# Patient Record
Sex: Female | Born: 1935 | Race: White | Hispanic: No | Smoking: Never smoker
Health system: Southern US, Community
[De-identification: ages and names within clinical notes are randomized; demographics above are authoritative.]

## PROBLEM LIST (undated history)

## (undated) DIAGNOSIS — I10 Essential (primary) hypertension: Secondary | ICD-10-CM

## (undated) DIAGNOSIS — E119 Type 2 diabetes mellitus without complications: Secondary | ICD-10-CM

## (undated) DIAGNOSIS — M1990 Unspecified osteoarthritis, unspecified site: Secondary | ICD-10-CM

## (undated) DIAGNOSIS — K219 Gastro-esophageal reflux disease without esophagitis: Secondary | ICD-10-CM

## (undated) HISTORY — PX: ABDOMINAL HYSTERECTOMY: SHX81

## (undated) HISTORY — DX: Essential (primary) hypertension: I10

## (undated) HISTORY — PX: OTHER SURGICAL HISTORY: SHX169

---

## 2004-01-31 ENCOUNTER — Ambulatory Visit (HOSPITAL_COMMUNITY): Admission: RE | Admit: 2004-01-31 | Discharge: 2004-01-31 | Payer: Self-pay | Admitting: Orthopaedic Surgery

## 2004-09-11 ENCOUNTER — Ambulatory Visit (HOSPITAL_COMMUNITY): Admission: RE | Admit: 2004-09-11 | Discharge: 2004-09-11 | Payer: Self-pay | Admitting: Orthopaedic Surgery

## 2010-10-06 ENCOUNTER — Encounter: Payer: Self-pay | Admitting: Orthopaedic Surgery

## 2012-08-04 DIAGNOSIS — R002 Palpitations: Secondary | ICD-10-CM | POA: Insufficient documentation

## 2012-08-04 DIAGNOSIS — H269 Unspecified cataract: Secondary | ICD-10-CM | POA: Insufficient documentation

## 2012-08-04 DIAGNOSIS — R0600 Dyspnea, unspecified: Secondary | ICD-10-CM | POA: Insufficient documentation

## 2012-08-18 ENCOUNTER — Encounter: Payer: Self-pay | Admitting: Cardiology

## 2012-09-18 ENCOUNTER — Ambulatory Visit: Payer: Self-pay | Admitting: Cardiology

## 2012-11-22 DIAGNOSIS — G4700 Insomnia, unspecified: Secondary | ICD-10-CM | POA: Insufficient documentation

## 2013-04-12 ENCOUNTER — Encounter: Payer: Self-pay | Admitting: Nurse Practitioner

## 2013-04-12 ENCOUNTER — Ambulatory Visit (INDEPENDENT_AMBULATORY_CARE_PROVIDER_SITE_OTHER): Payer: Medicare Other | Admitting: Nurse Practitioner

## 2013-04-12 VITALS — BP 111/75 | HR 62 | Temp 97.1°F | Ht 62.0 in | Wt 156.0 lb

## 2013-04-12 DIAGNOSIS — I10 Essential (primary) hypertension: Secondary | ICD-10-CM

## 2013-04-12 DIAGNOSIS — M170 Bilateral primary osteoarthritis of knee: Secondary | ICD-10-CM

## 2013-04-12 DIAGNOSIS — M1710 Unilateral primary osteoarthritis, unspecified knee: Secondary | ICD-10-CM

## 2013-04-12 MED ORDER — METOPROLOL SUCCINATE ER 25 MG PO TB24: 25.0000 mg | ORAL_TABLET | Freq: Every day | ORAL | Status: DC

## 2013-04-12 MED ORDER — CHLORTHALIDONE 25 MG PO TABS: 25.0000 mg | ORAL_TABLET | Freq: Every day | ORAL | Status: DC

## 2013-04-12 NOTE — Patient Instructions (Addendum)

## 2013-04-12 NOTE — Progress Notes (Signed)
  Subjective:    Patient ID: Norma Barton, female    DOB: Sep 01, 1935, 78 y.o.   MRN: [id number]  Hypertension This is a chronic problem. The current episode started more than 1 year ago. The problem has been resolved since onset. The problem is controlled. Associated symptoms include shortness of breath (DOE). Pertinent negatives include no anxiety, blurred vision, palpitations or peripheral edema. Risk factors for coronary artery disease include post-menopausal state. The current treatment provides significant improvement. There is no history of a thyroid problem. There is no history of sleep apnea.  Osteoarthritis bil knees Sees Dr. Hilda Lias- he rx her norco for pain- is planning to have total knee replacement in the next couple of years.   Review of Systems  Eyes: Negative for blurred vision.  Respiratory: Positive for shortness of breath (DOE).   Cardiovascular: Negative for palpitations.  All other systems reviewed and are negative.       Objective:   Physical Exam  Vitals reviewed. Constitutional: She is oriented to person, place, and time. She appears well-developed and well-nourished.  HENT:  Head: Normocephalic.  Right Ear: External ear normal.  Left Ear: External ear normal.  Mouth/Throat: Oropharynx is clear and moist.  Eyes: Pupils are equal, round, and reactive to light.  Neck: Normal range of motion. Neck supple. No thyromegaly present.  Cardiovascular: Normal rate, regular rhythm, normal heart sounds and intact distal pulses.   Pulmonary/Chest: Effort normal and breath sounds normal.  Abdominal: Soft. Bowel sounds are normal. She exhibits no distension. There is no tenderness.  Musculoskeletal: Normal range of motion. She exhibits no edema and no tenderness.  Neurological: She is alert and oriented to person, place, and time.  Skin: Skin is warm and dry.  Psychiatric: She has a normal mood and affect. Her behavior is normal. Judgment and thought content normal.     BP 111/75  Pulse 62  Temp(Src) 97.1 F (36.2 C) (Oral)  Ht 5\' 2"  (1.575 m)  Wt 156 lb (70.761 kg)  BMI 28.53 kg/m2       Assessment & Plan:

## 2013-04-14 LAB — CMP14+EGFR
ALT: 25 IU/L (ref 0–32)
AST: 26 IU/L (ref 0–40)
Albumin/Globulin Ratio: 1.9 (ref 1.1–2.5)
Calcium: 10.1 mg/dL (ref 8.6–10.2)
Chloride: 98 mmol/L (ref 97–108)
Glucose: 88 mg/dL (ref 65–99)
Potassium: 3.5 mmol/L (ref 3.5–5.2)
Sodium: 140 mmol/L (ref 134–144)
Total Protein: 6.3 g/dL (ref 6.0–8.5)

## 2013-04-14 LAB — NMR, LIPOPROFILE
HDL Cholesterol by NMR: 56 mg/dL (ref >=40)
LDL Particle Number: 931 nmol/L (ref <1000)
LDL Size: 22.1 nm (ref >20.5)
Small LDL Particle Number: 205 nmol/L (ref <=527)
Triglycerides by NMR: 187 mg/dL — ABNORMAL HIGH (ref <150)

## 2013-09-14 ENCOUNTER — Other Ambulatory Visit: Payer: Self-pay | Admitting: Nurse Practitioner

## 2013-09-17 NOTE — Telephone Encounter (Signed)
Last seen 04/12/13  MMM 

## 2013-10-19 ENCOUNTER — Other Ambulatory Visit: Payer: Self-pay | Admitting: Nurse Practitioner

## 2013-10-31 ENCOUNTER — Other Ambulatory Visit: Payer: Self-pay | Admitting: Nurse Practitioner

## 2013-11-19 ENCOUNTER — Other Ambulatory Visit: Payer: Self-pay | Admitting: Nurse Practitioner

## 2013-11-22 NOTE — Telephone Encounter (Signed)
Last seen 04/12/13  MMM 

## 2013-11-22 NOTE — Telephone Encounter (Signed)
Patient NTBS for follow up and lab work  

## 2013-12-23 ENCOUNTER — Telehealth: Payer: Self-pay | Admitting: Nurse Practitioner

## 2013-12-23 ENCOUNTER — Other Ambulatory Visit: Payer: Self-pay | Admitting: *Deleted

## 2013-12-23 MED ORDER — CHLORTHALIDONE 25 MG PO TABS: ORAL_TABLET | ORAL | Status: DC

## 2013-12-23 MED ORDER — METOPROLOL SUCCINATE ER 25 MG PO TB24: ORAL_TABLET | ORAL | Status: DC

## 2013-12-23 NOTE — Telephone Encounter (Signed)
Refilled meds x 1 until patient can get appt

## 2013-12-23 NOTE — Telephone Encounter (Signed)
Patient notified at last refill that NTBS. Please advise 

## 2013-12-23 NOTE — Telephone Encounter (Signed)
Can we refill metoprol one more time until we can schedule her to see you. Please send message back to me so i can let patient know

## 2014-01-17 ENCOUNTER — Telehealth: Payer: Self-pay | Admitting: Nurse Practitioner

## 2014-01-18 MED ORDER — METOPROLOL SUCCINATE ER 25 MG PO TB24: ORAL_TABLET | ORAL | Status: DC

## 2014-01-18 MED ORDER — CHLORTHALIDONE 25 MG PO TABS: ORAL_TABLET | ORAL | Status: DC

## 2014-01-18 NOTE — Telephone Encounter (Signed)
Meds refilled.

## 2014-02-03 ENCOUNTER — Ambulatory Visit: Payer: Medicare Other | Admitting: Nurse Practitioner

## 2014-02-03 ENCOUNTER — Encounter: Payer: Self-pay | Admitting: Nurse Practitioner

## 2014-02-03 ENCOUNTER — Ambulatory Visit (INDEPENDENT_AMBULATORY_CARE_PROVIDER_SITE_OTHER): Payer: Medicare Other | Admitting: Nurse Practitioner

## 2014-02-03 ENCOUNTER — Ambulatory Visit (INDEPENDENT_AMBULATORY_CARE_PROVIDER_SITE_OTHER): Payer: Medicare Other

## 2014-02-03 VITALS — BP 109/67 | HR 64 | Temp 99.0°F | Ht 62.0 in | Wt 155.0 lb

## 2014-02-03 DIAGNOSIS — M25559 Pain in unspecified hip: Secondary | ICD-10-CM

## 2014-02-03 DIAGNOSIS — M170 Bilateral primary osteoarthritis of knee: Secondary | ICD-10-CM

## 2014-02-03 DIAGNOSIS — R229 Localized swelling, mass and lump, unspecified: Secondary | ICD-10-CM

## 2014-02-03 DIAGNOSIS — Z13820 Encounter for screening for osteoporosis: Secondary | ICD-10-CM

## 2014-02-03 DIAGNOSIS — IMO0002 Reserved for concepts with insufficient information to code with codable children: Secondary | ICD-10-CM

## 2014-02-03 DIAGNOSIS — M25552 Pain in left hip: Secondary | ICD-10-CM

## 2014-02-03 DIAGNOSIS — I10 Essential (primary) hypertension: Secondary | ICD-10-CM

## 2014-02-03 DIAGNOSIS — N6459 Other signs and symptoms in breast: Secondary | ICD-10-CM

## 2014-02-03 DIAGNOSIS — M1710 Unilateral primary osteoarthritis, unspecified knee: Secondary | ICD-10-CM

## 2014-02-03 MED ORDER — CHLORTHALIDONE 25 MG PO TABS: ORAL_TABLET | ORAL | Status: DC

## 2014-02-03 MED ORDER — METOPROLOL SUCCINATE ER 25 MG PO TB24: ORAL_TABLET | ORAL | Status: DC

## 2014-02-03 NOTE — Progress Notes (Signed)
  Subjective:    Patient ID: Norma Barton, female    DOB: [birth year], 79 y.o.   MRN: [id number]  Patient here today for follow up of chronic medical problems. Patient has no complaints today. However she is concerned about right nipple has become inverted- no discharge or discomfort.  Hypertension This is a chronic problem. The current episode started more than 1 year ago. The problem has been resolved since onset. The problem is controlled. Associated symptoms include shortness of breath (DOE). Pertinent negatives include no anxiety, blurred vision, palpitations or peripheral edema. Risk factors for coronary artery disease include post-menopausal state. The current treatment provides significant improvement. There is no history of a thyroid problem. There is no history of sleep apnea.  Osteoarthritis bil knees Sees Dr. Luna Glasgow- he rx her norco for pain- is planning to have total knee replacement in the next couple of years.  *Patient c/o nodules coming up on forearms- they do not hurt * left hip nodule- nontender   Review of Systems  Eyes: Negative for blurred vision.  Respiratory: Positive for shortness of breath (DOE).   Cardiovascular: Negative for palpitations.  All other systems reviewed and are negative.      Objective:   Physical Exam  Vitals reviewed. Constitutional: She is oriented to person, place, and time. She appears well-developed and well-nourished.  HENT:  Head: Normocephalic.  Right Ear: External ear normal.  Left Ear: External ear normal.  Mouth/Throat: Oropharynx is clear and moist.  Eyes: Pupils are equal, round, and reactive to light.  Neck: Normal range of motion. Neck supple. No thyromegaly present.  Cardiovascular: Normal rate, regular rhythm, normal heart sounds and intact distal pulses.   Pulmonary/Chest: Effort normal and breath sounds normal. Right breast exhibits inverted nipple. Right breast exhibits no mass, no nipple discharge, no skin change and  no tenderness. Left breast exhibits no inverted nipple, no mass, no nipple discharge, no skin change and no tenderness.  Abdominal: Soft. Bowel sounds are normal. She exhibits no distension. There is no tenderness.  Musculoskeletal: Normal range of motion. She exhibits no edema and no tenderness.  Neurological: She is alert and oriented to person, place, and time.  Skin: Skin is warm and dry.  Mobile 2cm annular nontender nodules left forearm and elbow.  Psychiatric: She has a normal mood and affect. Her behavior is normal. Judgment and thought content normal.    BP 109/67  Pulse 64  Temp(Src) 99 F (37.2 C) (Oral)  Ht 5' 2" (1.575 m)  Wt 155 lb (70.308 kg)  BMI 28.34 kg/m2  legft hip xray- normal-Preliminary reading by Ronnald Collum, FNP  [facility]      Assessment & Plan:  1. Hypertension Low NA+ diet - chlorthalidone (HYGROTON) 25 MG tablet; TAKE 1 TABLET (25 MG TOTAL) BY MOUTH DAILY.  Dispense: 30 tablet; Refill: 0 - metoprolol succinate (TOPROL-XL) 25 MG 24 hr tablet; TAKE 1 TABLET (25 MG TOTAL) BY MOUTH DAILY.  Dispense: 30 tablet; Refill: 0 - CMP14+EGFR - NMR, lipoprofile  2. Osteoarthritis of both knees Keep appointment with Dr. Luna Glasgow  3. Inversion, nipple Wait on mammo results - MM Digital Diagnostic Unilat R; Future  4. Subcutaneous nodules, generalized Watch areas until sees dermatology - Ambulatory referral to Dermatology  5. Left hip pain Will watch area - DG Hip Complete Left; Future  6. Osteoporosis screening - DG Bone Density; Future   [name] Done, FNP

## 2014-02-03 NOTE — Patient Instructions (Signed)
Medicare Annual Wellness Visit  [place] and the medical providers at [facility] strive to bring you the best medical care.  In doing so we not only want to address your current medical conditions and concerns but also to detect new conditions early and prevent illness, disease and health-related problems.    Medicare offers a yearly Wellness Visit which allows our clinical staff to assess your need for preventative services including immunizations, lifestyle education, counseling to decrease risk of preventable diseases and screening for fall risk and other medical concerns.    This visit is provided free of charge (no copay) for all Medicare recipients. The clinical pharmacists at [facility] have begun to conduct these Wellness Visits which will also include a thorough review of all your medications.    As you primary medical provider recommend that you make an appointment for your Annual Wellness Visit if you have not done so already this year.  You may set up this appointment before you leave today or you may call back ([phone]) and schedule an appointment.  Please make sure when you call that you mention that you are scheduling your Annual Wellness Visit with the clinical pharmacist so that the appointment may be made for the proper length of time.    

## 2014-02-04 LAB — CMP14+EGFR
A/G RATIO: 1.8 (ref 1.1–2.5)
ALT: 22 IU/L (ref 0–32)
AST: 33 IU/L (ref 0–40)
Albumin: 4 g/dL (ref 3.5–4.8)
Alkaline Phosphatase: 56 IU/L (ref 39–117)
BUN/Creatinine Ratio: 12 (ref 11–26)
BUN: 12 mg/dL (ref 8–27)
CALCIUM: 10 mg/dL (ref 8.7–10.3)
CO2: 34 mmol/L — ABNORMAL HIGH (ref 18–29)
Chloride: 97 mmol/L (ref 97–108)
Creatinine, Ser: 0.99 mg/dL (ref 0.57–1.00)
GFR calc Af Amer: 64 mL/min/{1.73_m2} (ref >59)
GFR, EST NON AFRICAN AMERICAN: 56 mL/min/{1.73_m2} — AB (ref >59)
GLUCOSE: 88 mg/dL (ref 65–99)
Globulin, Total: 2.2 g/dL (ref 1.5–4.5)
Potassium: 3.2 mmol/L — ABNORMAL LOW (ref 3.5–5.2)
Sodium: 144 mmol/L (ref 134–144)
TOTAL PROTEIN: 6.2 g/dL (ref 6.0–8.5)
Total Bilirubin: 0.4 mg/dL (ref 0.0–1.2)

## 2014-02-04 LAB — NMR, LIPOPROFILE
Cholesterol: 149 mg/dL (ref 100–199)
HDL Cholesterol by NMR: 51 mg/dL (ref >39)
HDL Particle Number: 33.7 umol/L (ref >=30.5)
LDL PARTICLE NUMBER: 732 nmol/L (ref <1000)
LDL Size: 21.5 nm (ref >20.5)
LDLC SERPL CALC-MCNC: 65 mg/dL (ref 0–99)
LP-IR Score: 74 — ABNORMAL HIGH (ref <=45)
TRIGLYCERIDES BY NMR: 167 mg/dL — AB (ref 0–149)

## 2014-02-10 ENCOUNTER — Telehealth: Payer: Self-pay | Admitting: Family Medicine

## 2014-02-10 NOTE — Telephone Encounter (Signed)
Message copied by Azalee Course on Thu Feb 10, 2014 10:54 AM ------      Message from: [name]      Created: Fri Feb 04, 2014  4:09 PM       Kidney and liver function stable      Cholesterol looks good      Continue current meds- low fat diet and exercise and recheck in 3 months       ------

## 2014-03-02 ENCOUNTER — Telehealth: Payer: Self-pay | Admitting: Nurse Practitioner

## 2014-03-14 ENCOUNTER — Other Ambulatory Visit: Payer: Self-pay | Admitting: Nurse Practitioner

## 2014-03-28 ENCOUNTER — Other Ambulatory Visit: Payer: Self-pay | Admitting: Nurse Practitioner

## 2014-03-28 NOTE — Telephone Encounter (Signed)
Patient notified at last refill that NTBS. Please advise 

## 2014-03-28 NOTE — Telephone Encounter (Signed)
No more refills without being seen 

## 2014-03-29 ENCOUNTER — Other Ambulatory Visit: Payer: Medicare Other | Admitting: Nurse Practitioner

## 2014-04-04 ENCOUNTER — Ambulatory Visit: Payer: Medicare Other

## 2014-04-27 ENCOUNTER — Other Ambulatory Visit: Payer: Medicare Other

## 2014-04-27 ENCOUNTER — Ambulatory Visit: Payer: Medicare Other

## 2014-05-02 ENCOUNTER — Other Ambulatory Visit: Payer: Self-pay | Admitting: Nurse Practitioner

## 2014-06-30 ENCOUNTER — Telehealth: Payer: Self-pay | Admitting: Nurse Practitioner

## 2014-06-30 DIAGNOSIS — M170 Bilateral primary osteoarthritis of knee: Secondary | ICD-10-CM

## 2014-07-01 NOTE — Telephone Encounter (Signed)
[facility] [employer] [facility]. She was scheduled for 11/4 but they cancelled the appt until she gets the referral. Referral placed.

## 2014-07-03 ENCOUNTER — Other Ambulatory Visit: Payer: Self-pay | Admitting: Nurse Practitioner

## 2014-07-04 NOTE — Telephone Encounter (Signed)
Patient NTBS for follow up and lab work  

## 2014-07-04 NOTE — Telephone Encounter (Signed)
Last seen 02/03/14  MMM 

## 2014-07-06 ENCOUNTER — Other Ambulatory Visit: Payer: Self-pay | Admitting: Nurse Practitioner

## 2014-08-04 ENCOUNTER — Other Ambulatory Visit: Payer: Self-pay | Admitting: Nurse Practitioner

## 2014-08-30 ENCOUNTER — Other Ambulatory Visit: Payer: Self-pay | Admitting: Nurse Practitioner

## 2014-09-02 ENCOUNTER — Other Ambulatory Visit: Payer: Self-pay | Admitting: Nurse Practitioner

## 2014-09-29 DIAGNOSIS — M25562 Pain in left knee: Secondary | ICD-10-CM | POA: Diagnosis not present

## 2014-09-29 DIAGNOSIS — M25561 Pain in right knee: Secondary | ICD-10-CM | POA: Diagnosis not present

## 2014-09-30 ENCOUNTER — Other Ambulatory Visit: Payer: Self-pay | Admitting: Nurse Practitioner

## 2014-11-10 DIAGNOSIS — M25561 Pain in right knee: Secondary | ICD-10-CM | POA: Diagnosis not present

## 2014-11-10 DIAGNOSIS — Z5181 Encounter for therapeutic drug level monitoring: Secondary | ICD-10-CM | POA: Diagnosis not present

## 2014-11-10 DIAGNOSIS — M25562 Pain in left knee: Secondary | ICD-10-CM | POA: Diagnosis not present

## 2014-11-12 ENCOUNTER — Other Ambulatory Visit: Payer: Self-pay | Admitting: Nurse Practitioner

## 2014-11-15 ENCOUNTER — Telehealth: Payer: Self-pay | Admitting: Nurse Practitioner

## 2014-11-15 MED ORDER — CHLORTHALIDONE 25 MG PO TABS: ORAL_TABLET | ORAL | Status: DC

## 2014-11-15 MED ORDER — METOPROLOL SUCCINATE ER 25 MG PO TB24: ORAL_TABLET | ORAL | Status: DC

## 2014-11-15 NOTE — Telephone Encounter (Signed)
done

## 2014-12-14 ENCOUNTER — Encounter: Payer: Self-pay | Admitting: Nurse Practitioner

## 2014-12-14 ENCOUNTER — Ambulatory Visit (INDEPENDENT_AMBULATORY_CARE_PROVIDER_SITE_OTHER): Payer: Commercial Managed Care - HMO

## 2014-12-14 ENCOUNTER — Ambulatory Visit (INDEPENDENT_AMBULATORY_CARE_PROVIDER_SITE_OTHER): Payer: Commercial Managed Care - HMO | Admitting: Nurse Practitioner

## 2014-12-14 ENCOUNTER — Other Ambulatory Visit: Payer: Self-pay | Admitting: Nurse Practitioner

## 2014-12-14 VITALS — BP 107/77 | HR 73 | Temp 97.4°F | Ht 62.0 in | Wt 156.0 lb

## 2014-12-14 DIAGNOSIS — I10 Essential (primary) hypertension: Secondary | ICD-10-CM | POA: Diagnosis not present

## 2014-12-14 DIAGNOSIS — Z13820 Encounter for screening for osteoporosis: Secondary | ICD-10-CM

## 2014-12-14 DIAGNOSIS — M170 Bilateral primary osteoarthritis of knee: Secondary | ICD-10-CM

## 2014-12-14 DIAGNOSIS — Z780 Asymptomatic menopausal state: Secondary | ICD-10-CM | POA: Diagnosis not present

## 2014-12-14 MED ORDER — METOPROLOL SUCCINATE ER 25 MG PO TB24
ORAL_TABLET | ORAL | Status: DC
Start: 2014-12-14 — End: 2015-06-02

## 2014-12-14 MED ORDER — CHLORTHALIDONE 25 MG PO TABS: ORAL_TABLET | ORAL | Status: DC

## 2014-12-14 NOTE — Addendum Note (Signed)
Addended by: Prescott GumLAND, Ysabel Cowgill M on: 12/14/2014 03:12 PM   Modules accepted: Kipp BroodSmartSet

## 2014-12-14 NOTE — Progress Notes (Signed)
  Subjective:    Patient ID: Norma Barton, female    DOB: 08-02-1935, 80 y.o.   MRN: [id number]  Patient here today for follow up of chronic medical problems. Patient has no acute complaints today.   Hypertension This is a chronic problem. The current episode started more than 1 year ago. The problem has been resolved since onset. The problem is controlled. Associated symptoms include shortness of breath (DOE). Pertinent negatives include no anxiety, blurred vision, palpitations or peripheral edema. Risk factors for coronary artery disease include post-menopausal state. The current treatment provides significant improvement. There is no history of a thyroid problem. There is no history of sleep apnea.  Osteoarthritis bil knees Sees Dr. Luna Glasgow- she has appointment tomorrow and is currently taking hydrocodone prescribe by Dr. Luna Glasgow for pain.  *patient complains cold feeling in her left hand.    Review of Systems  Eyes: Negative for blurred vision.  Respiratory: Positive for shortness of breath (DOE).   Cardiovascular: Negative for palpitations.  All other systems reviewed and are negative.      Objective:   Physical Exam  Constitutional: She is oriented to person, place, and time. She appears well-developed and well-nourished.  HENT:  Head: Normocephalic.  Right Ear: External ear normal.  Left Ear: External ear normal.  Mouth/Throat: Oropharynx is clear and moist.  Eyes: Pupils are equal, round, and reactive to light.  Neck: Normal range of motion. Neck supple. No thyromegaly present.  Cardiovascular: Normal rate, regular rhythm, normal heart sounds and intact distal pulses.   Pulmonary/Chest: Effort normal and breath sounds normal. Right breast exhibits inverted nipple. Right breast exhibits no mass, no nipple discharge, no skin change and no tenderness. Left breast exhibits no inverted nipple, no mass, no nipple discharge, no skin change and no tenderness.  Abdominal: Soft.  Bowel sounds are normal. She exhibits no distension. There is no tenderness.  Musculoskeletal: Normal range of motion. She exhibits no edema or tenderness.  Neurological: She is alert and oriented to person, place, and time.  Skin: Skin is warm and dry.  Mobile 2cm annular nontender nodules left forearm and elbow.  Psychiatric: She has a normal mood and affect. Her behavior is normal. Judgment and thought content normal.  Vitals reviewed.   BP 107/77 mmHg  Pulse 73  Temp(Src) 97.4 F (36.3 C) (Oral)  Ht $R'5\' 2"'Qy$  (1.575 m)  Wt 156 lb (70.761 kg)  BMI 28.53 kg/m2  Adella Nissen, FNP Chest xray- normal-Preliminary reading by Ronnald Collum, FNP  [facility]      Assessment & Plan:  1. Essential hypertension Do not add slat to diet - CMP14+EGFR - NMR, lipoprofile - chlorthalidone (HYGROTON) 25 MG tablet; TAKE 1 TABLET (25 MG TOTAL) BY MOUTH DAILY.  Dispense: 30 tablet; Refill: 5 - metoprolol succinate (TOPROL-XL) 25 MG 24 hr tablet; TAKE 1 TABLET (25 MG TOTAL) BY MOUTH DAILY.  Dispense: 30 tablet; Refill: 5 - DG Chest 2 View; Future - EKG 12-Lead  2. Primary osteoarthritis of both knees Rest  Ice  elevate   Refuses adult immunizations Labs pending Health maintenance reviewed Diet and exercise encouraged Continue all meds Follow up  In 3 months   [place], FNP

## 2014-12-14 NOTE — Patient Instructions (Signed)

## 2014-12-15 ENCOUNTER — Other Ambulatory Visit: Payer: Self-pay | Admitting: Nurse Practitioner

## 2014-12-15 DIAGNOSIS — M25562 Pain in left knee: Secondary | ICD-10-CM | POA: Diagnosis not present

## 2014-12-15 DIAGNOSIS — Z6828 Body mass index (BMI) 28.0-28.9, adult: Secondary | ICD-10-CM | POA: Diagnosis not present

## 2014-12-15 DIAGNOSIS — Z5181 Encounter for therapeutic drug level monitoring: Secondary | ICD-10-CM | POA: Diagnosis not present

## 2014-12-15 DIAGNOSIS — M25561 Pain in right knee: Secondary | ICD-10-CM | POA: Diagnosis not present

## 2014-12-15 LAB — CMP14+EGFR
ALBUMIN: 4 g/dL (ref 3.5–4.8)
ALT: 21 IU/L (ref 0–32)
AST: 42 IU/L — ABNORMAL HIGH (ref 0–40)
Albumin/Globulin Ratio: 1.8 (ref 1.1–2.5)
Alkaline Phosphatase: 55 IU/L (ref 39–117)
BUN / CREAT RATIO: 14 (ref 11–26)
BUN: 11 mg/dL (ref 8–27)
Bilirubin Total: 0.5 mg/dL (ref 0.0–1.2)
CHLORIDE: 96 mmol/L — AB (ref 97–108)
CO2: 31 mmol/L — ABNORMAL HIGH (ref 18–29)
Calcium: 9.7 mg/dL (ref 8.7–10.3)
Creatinine, Ser: 0.8 mg/dL (ref 0.57–1.00)
GFR calc Af Amer: 82 mL/min/{1.73_m2} (ref >59)
GFR calc non Af Amer: 71 mL/min/{1.73_m2} (ref >59)
GLUCOSE: 118 mg/dL — AB (ref 65–99)
Globulin, Total: 2.2 g/dL (ref 1.5–4.5)
POTASSIUM: 2.8 mmol/L — AB (ref 3.5–5.2)
Sodium: 143 mmol/L (ref 134–144)
Total Protein: 6.2 g/dL (ref 6.0–8.5)

## 2014-12-15 LAB — NMR, LIPOPROFILE
CHOLESTEROL: 145 mg/dL (ref 100–199)
HDL Cholesterol by NMR: 47 mg/dL (ref >39)
HDL Particle Number: 33.5 umol/L (ref >=30.5)
LDL Particle Number: 799 nmol/L (ref <1000)
LDL SIZE: 21 nm (ref >20.5)
LDL-C: 61 mg/dL (ref 0–99)
LP-IR Score: 99 — ABNORMAL HIGH (ref <=45)
Small LDL Particle Number: 218 nmol/L (ref <=527)
TRIGLYCERIDES BY NMR: 184 mg/dL — AB (ref 0–149)

## 2014-12-15 MED ORDER — POTASSIUM CHLORIDE CRYS ER 20 MEQ PO TBCR: 20.0000 meq | EXTENDED_RELEASE_TABLET | Freq: Two times a day (BID) | ORAL | Status: DC

## 2015-01-12 DIAGNOSIS — M25561 Pain in right knee: Secondary | ICD-10-CM | POA: Diagnosis not present

## 2015-01-12 DIAGNOSIS — Z6828 Body mass index (BMI) 28.0-28.9, adult: Secondary | ICD-10-CM | POA: Diagnosis not present

## 2015-01-12 DIAGNOSIS — M25562 Pain in left knee: Secondary | ICD-10-CM | POA: Diagnosis not present

## 2015-01-12 DIAGNOSIS — Z5181 Encounter for therapeutic drug level monitoring: Secondary | ICD-10-CM | POA: Diagnosis not present

## 2015-02-14 ENCOUNTER — Telehealth: Payer: Self-pay | Admitting: Nurse Practitioner

## 2015-02-14 DIAGNOSIS — M25561 Pain in right knee: Secondary | ICD-10-CM | POA: Diagnosis not present

## 2015-02-14 DIAGNOSIS — Z5181 Encounter for therapeutic drug level monitoring: Secondary | ICD-10-CM | POA: Diagnosis not present

## 2015-02-14 DIAGNOSIS — Z6829 Body mass index (BMI) 29.0-29.9, adult: Secondary | ICD-10-CM | POA: Diagnosis not present

## 2015-02-14 DIAGNOSIS — M25562 Pain in left knee: Secondary | ICD-10-CM | POA: Diagnosis not present

## 2015-02-14 NOTE — Telephone Encounter (Signed)
Referral has been done and approved in [facility]

## 2015-03-16 ENCOUNTER — Ambulatory Visit: Payer: Commercial Managed Care - HMO | Admitting: Nurse Practitioner

## 2015-03-16 DIAGNOSIS — M1712 Unilateral primary osteoarthritis, left knee: Secondary | ICD-10-CM | POA: Diagnosis not present

## 2015-03-16 DIAGNOSIS — M1711 Unilateral primary osteoarthritis, right knee: Secondary | ICD-10-CM | POA: Diagnosis not present

## 2015-03-16 DIAGNOSIS — Z5181 Encounter for therapeutic drug level monitoring: Secondary | ICD-10-CM | POA: Diagnosis not present

## 2015-03-16 DIAGNOSIS — M25561 Pain in right knee: Secondary | ICD-10-CM | POA: Diagnosis not present

## 2015-03-16 DIAGNOSIS — Z6828 Body mass index (BMI) 28.0-28.9, adult: Secondary | ICD-10-CM | POA: Diagnosis not present

## 2015-03-16 DIAGNOSIS — M25562 Pain in left knee: Secondary | ICD-10-CM | POA: Diagnosis not present

## 2015-04-13 DIAGNOSIS — Z6829 Body mass index (BMI) 29.0-29.9, adult: Secondary | ICD-10-CM | POA: Diagnosis not present

## 2015-04-13 DIAGNOSIS — M1712 Unilateral primary osteoarthritis, left knee: Secondary | ICD-10-CM | POA: Diagnosis not present

## 2015-04-13 DIAGNOSIS — M25562 Pain in left knee: Secondary | ICD-10-CM | POA: Diagnosis not present

## 2015-04-13 DIAGNOSIS — Z5181 Encounter for therapeutic drug level monitoring: Secondary | ICD-10-CM | POA: Diagnosis not present

## 2015-04-13 DIAGNOSIS — M25561 Pain in right knee: Secondary | ICD-10-CM | POA: Diagnosis not present

## 2015-05-02 ENCOUNTER — Ambulatory Visit (INDEPENDENT_AMBULATORY_CARE_PROVIDER_SITE_OTHER): Payer: Commercial Managed Care - HMO | Admitting: Nurse Practitioner

## 2015-05-02 VITALS — BP 117/78 | HR 69 | Temp 97.3°F | Ht 62.0 in | Wt 159.2 lb

## 2015-05-02 DIAGNOSIS — N3000 Acute cystitis without hematuria: Secondary | ICD-10-CM | POA: Diagnosis not present

## 2015-05-02 DIAGNOSIS — R300 Dysuria: Secondary | ICD-10-CM | POA: Diagnosis not present

## 2015-05-02 LAB — POCT UA - MICROSCOPIC ONLY
BACTERIA, U MICROSCOPIC: NEGATIVE
CRYSTALS, UR, HPF, POC: NEGATIVE
Casts, Ur, LPF, POC: NEGATIVE
EPITHELIAL CELLS, URINE PER MICROSCOPY: NEGATIVE
Yeast, UA: NEGATIVE

## 2015-05-02 LAB — POCT URINALYSIS DIPSTICK
GLUCOSE UA: NEGATIVE
KETONES UA: NEGATIVE
Nitrite, UA: NEGATIVE
Spec Grav, UA: >=1.03
UROBILINOGEN UA: NEGATIVE
pH, UA: 6

## 2015-05-02 MED ORDER — CIPROFLOXACIN HCL 500 MG PO TABS: 500.0000 mg | ORAL_TABLET | Freq: Two times a day (BID) | ORAL | Status: DC

## 2015-05-02 NOTE — Progress Notes (Signed)
   Subjective:    Patient ID: Norma Barton, female    DOB: Sep 30, 1934, 80 y.o.   MRN: [id number]  HPI Patient comes in c/o dysuria and frequency- she has had several episodes of incontinence in the last 2 weeks- some nocturia. denies back pain and pelvic pain. She has been taking AZO OTC which has helped with symptoms.    Review of Systems  Constitutional: Negative.   HENT: Negative.   Respiratory: Negative.   Cardiovascular: Negative.   Genitourinary: Positive for dysuria, urgency and frequency. Negative for pelvic pain.  Neurological: Negative.   Psychiatric/Behavioral: Negative.   All other systems reviewed and are negative.      Objective:   Physical Exam  Constitutional: She appears well-developed and well-nourished.  Cardiovascular: Normal heart sounds.   Pulmonary/Chest: Effort normal and breath sounds normal.  Abdominal: Soft. Bowel sounds are normal. She exhibits no distension. There is no tenderness. There is no rebound.  Genitourinary:  No CVA tenderness  Skin: Skin is warm and dry.  Psychiatric: She has a normal mood and affect. Her behavior is normal. Judgment and thought content normal.  Vitals reviewed.  BP 117/78 mmHg  Pulse 69  Temp(Src) 97.3 F (36.3 C) (Oral)  Ht  (1.575 m)  Wt 159 lb 3.2 oz (72.213 kg)  BMI 29.11 kg/m2  Results for orders placed or performed in visit on 05/02/15  POCT urinalysis dipstick  Result Value Ref Range   Color, UA orange    Clarity, UA cloudy    Glucose, UA neg    Bilirubin, UA small    Ketones, UA neg    Spec Grav, UA >=1.030    Blood, UA mod    pH, UA 6.0    Protein, UA 30+    Urobilinogen, UA negative    Nitrite, UA neg    Leukocytes, UA small (1+) (A) Negative          Assessment & Plan:  1. Dysuria - POCT UA - Microscopic Only - POCT urinalysis dipstick  2. Acute cystitis without hematuria Take medication as prescribe Cotton underwear Take shower not bath Cranberry juice, yogurt Force  fluids AZO over the counter X2 days Culture pending RTO prn - ciprofloxacin (CIPRO) 500 MG tablet; Take 1 tablet (500 mg total) by mouth 2 (two) times daily.  Dispense: 10 tablet; Refill: 0  Mary-Margaret Daphine Deutscher, FNP

## 2015-05-02 NOTE — Patient Instructions (Signed)

## 2015-05-11 DIAGNOSIS — M25562 Pain in left knee: Secondary | ICD-10-CM | POA: Diagnosis not present

## 2015-05-11 DIAGNOSIS — M25561 Pain in right knee: Secondary | ICD-10-CM | POA: Diagnosis not present

## 2015-05-11 DIAGNOSIS — Z5181 Encounter for therapeutic drug level monitoring: Secondary | ICD-10-CM | POA: Diagnosis not present

## 2015-05-11 DIAGNOSIS — Z6828 Body mass index (BMI) 28.0-28.9, adult: Secondary | ICD-10-CM | POA: Diagnosis not present

## 2015-06-02 ENCOUNTER — Other Ambulatory Visit: Payer: Self-pay | Admitting: Nurse Practitioner

## 2015-06-13 DIAGNOSIS — M25562 Pain in left knee: Secondary | ICD-10-CM | POA: Diagnosis not present

## 2015-06-13 DIAGNOSIS — M25561 Pain in right knee: Secondary | ICD-10-CM | POA: Diagnosis not present

## 2015-06-13 DIAGNOSIS — Z6828 Body mass index (BMI) 28.0-28.9, adult: Secondary | ICD-10-CM | POA: Diagnosis not present

## 2015-06-13 DIAGNOSIS — Z5181 Encounter for therapeutic drug level monitoring: Secondary | ICD-10-CM | POA: Diagnosis not present

## 2015-07-01 ENCOUNTER — Other Ambulatory Visit: Payer: Self-pay | Admitting: Nurse Practitioner

## 2015-07-03 ENCOUNTER — Telehealth: Payer: Self-pay | Admitting: Nurse Practitioner

## 2015-07-03 NOTE — Telephone Encounter (Signed)
Done today.

## 2015-07-13 DIAGNOSIS — Z6828 Body mass index (BMI) 28.0-28.9, adult: Secondary | ICD-10-CM | POA: Diagnosis not present

## 2015-07-13 DIAGNOSIS — M25562 Pain in left knee: Secondary | ICD-10-CM | POA: Diagnosis not present

## 2015-07-13 DIAGNOSIS — M25561 Pain in right knee: Secondary | ICD-10-CM | POA: Diagnosis not present

## 2015-08-14 ENCOUNTER — Telehealth: Payer: Self-pay | Admitting: Nurse Practitioner

## 2015-08-15 DIAGNOSIS — Z6828 Body mass index (BMI) 28.0-28.9, adult: Secondary | ICD-10-CM | POA: Diagnosis not present

## 2015-08-15 DIAGNOSIS — M25561 Pain in right knee: Secondary | ICD-10-CM | POA: Diagnosis not present

## 2015-08-15 DIAGNOSIS — M25562 Pain in left knee: Secondary | ICD-10-CM | POA: Diagnosis not present

## 2015-08-28 ENCOUNTER — Other Ambulatory Visit: Payer: Self-pay | Admitting: Nurse Practitioner

## 2015-08-28 NOTE — Telephone Encounter (Signed)
Last seen 12/14/14 MMM 

## 2015-08-28 NOTE — Telephone Encounter (Signed)
Last refill without being seen 

## 2015-09-25 ENCOUNTER — Other Ambulatory Visit: Payer: Self-pay | Admitting: Nurse Practitioner

## 2015-09-26 NOTE — Telephone Encounter (Signed)
Patient aware that she will need to be seen  

## 2015-09-26 NOTE — Telephone Encounter (Signed)
Last refill without being seen 

## 2015-09-26 NOTE — Telephone Encounter (Signed)
Last seen 12/14/14 MMM 

## 2015-10-03 DIAGNOSIS — M25562 Pain in left knee: Secondary | ICD-10-CM | POA: Diagnosis not present

## 2015-10-03 DIAGNOSIS — M25561 Pain in right knee: Secondary | ICD-10-CM | POA: Diagnosis not present

## 2015-10-03 DIAGNOSIS — Z6829 Body mass index (BMI) 29.0-29.9, adult: Secondary | ICD-10-CM | POA: Diagnosis not present

## 2015-10-31 ENCOUNTER — Encounter: Payer: Self-pay | Admitting: Nurse Practitioner

## 2015-10-31 ENCOUNTER — Ambulatory Visit (INDEPENDENT_AMBULATORY_CARE_PROVIDER_SITE_OTHER): Payer: Commercial Managed Care - HMO | Admitting: Nurse Practitioner

## 2015-10-31 VITALS — BP 134/86 | HR 82 | Temp 98.4°F | Ht 62.0 in | Wt 160.0 lb

## 2015-10-31 DIAGNOSIS — Z6829 Body mass index (BMI) 29.0-29.9, adult: Secondary | ICD-10-CM

## 2015-10-31 DIAGNOSIS — M170 Bilateral primary osteoarthritis of knee: Secondary | ICD-10-CM

## 2015-10-31 DIAGNOSIS — I10 Essential (primary) hypertension: Secondary | ICD-10-CM

## 2015-10-31 MED ORDER — CHLORTHALIDONE 25 MG PO TABS: ORAL_TABLET | ORAL | Status: DC

## 2015-10-31 MED ORDER — METOPROLOL SUCCINATE ER 25 MG PO TB24: ORAL_TABLET | ORAL | Status: DC

## 2015-10-31 NOTE — Patient Instructions (Signed)
Fall Prevention in the Home  Falls can cause injuries and can affect people from all age groups. There are many simple things that you can do to make your home safe and to help prevent falls. WHAT CAN I DO ON THE OUTSIDE OF MY HOME?  Regularly repair the edges of walkways and driveways and fix any cracks.  Remove high doorway thresholds.  Trim any shrubbery on the main path into your home.  Use bright outdoor lighting.  Clear walkways of debris and clutter, including tools and rocks.  Regularly check that handrails are securely fastened and in good repair. Both sides of any steps should have handrails.  Install guardrails along the edges of any raised decks or porches.  Have leaves, snow, and ice cleared regularly.  Use sand or salt on walkways during winter months.  In the garage, clean up any spills right away, including grease or oil spills. WHAT CAN I DO IN THE BATHROOM?  Use night lights.  Install grab bars by the toilet and in the tub and shower. Do not use towel bars as grab bars.  Use non-skid mats or decals on the floor of the tub or shower.  If you need to sit down while you are in the shower, use a plastic, non-slip stool..  Keep the floor dry. Immediately clean up any water that spills on the floor.  Remove soap buildup in the tub or shower on a regular basis.  Attach bath mats securely with double-sided non-slip rug tape.  Remove throw rugs and other tripping hazards from the floor. WHAT CAN I DO IN THE BEDROOM?  Use night lights.  Make sure that a bedside light is easy to reach.  Do not use oversized bedding that drapes onto the floor.  Have a firm chair that has side arms to use for getting dressed.  Remove throw rugs and other tripping hazards from the floor. WHAT CAN I DO IN THE KITCHEN?   Clean up any spills right away.  Avoid walking on wet floors.  Place frequently used items in easy-to-reach places.  If you need to reach for something  above you, use a sturdy step stool that has a grab bar.  Keep electrical cables out of the way.  Do not use floor polish or wax that makes floors slippery. If you have to use wax, make sure that it is non-skid floor wax.  Remove throw rugs and other tripping hazards from the floor. WHAT CAN I DO IN THE STAIRWAYS?  Do not leave any items on the stairs.  Make sure that there are handrails on both sides of the stairs. Fix handrails that are broken or loose. Make sure that handrails are as long as the stairways.  Check any carpeting to make sure that it is firmly attached to the stairs. Fix any carpet that is loose or worn.  Avoid having throw rugs at the top or bottom of stairways, or secure the rugs with carpet tape to prevent them from moving.  Make sure that you have a light switch at the top of the stairs and the bottom of the stairs. If you do not have them, have them installed. WHAT ARE SOME OTHER FALL PREVENTION TIPS?  Wear closed-toe shoes that fit well and support your feet. Wear shoes that have rubber soles or low heels.  When you use a stepladder, make sure that it is completely opened and that the sides are firmly locked. Have someone hold the ladder while you   are using it. Do not climb a closed stepladder.  Add color or contrast paint or tape to grab bars and handrails in your home. Place contrasting color strips on the first and last steps.  Use mobility aids as needed, such as canes, walkers, scooters, and crutches.  Turn on lights if it is dark. Replace any light bulbs that burn out.  Set up furniture so that there are clear paths. Keep the furniture in the same spot.  Fix any uneven floor surfaces.  Choose a carpet design that does not hide the edge of steps of a stairway.  Be aware of any and all pets.  Review your medicines with your healthcare provider. Some medicines can cause dizziness or changes in blood pressure, which increase your risk of falling. Talk  with your health care provider about other ways that you can decrease your risk of falls. This may include working with a physical therapist or trainer to improve your strength, balance, and endurance.   This information is not intended to replace advice given to you by your health care provider. Make sure you discuss any questions you have with your health care provider.   Document Released: 08/23/2002 Document Revised: 01/17/2015 Document Reviewed: 10/07/2014 Elsevier Interactive Patient Education 2016 [employer] Inc.  

## 2015-10-31 NOTE — Progress Notes (Signed)
Subjective:    Patient ID: Norma Barton, female    DOB: 1935-01-19, 81 y.o.   MRN: [id number]  Patient here today for follow up of chronic medical problems.  Outpatient Encounter Prescriptions as of 10/31/2015  Medication Sig  . chlorthalidone (HYGROTON) 25 MG tablet TAKE 1 TABLET (25 MG TOTAL) BY MOUTH DAILY.  Marland Kitchen HYDROcodone-acetaminophen (NORCO) 7.5-325 MG per tablet Take 1 tablet by mouth every 4 (four) hours as needed.   . metoprolol succinate (TOPROL-XL) 25 MG 24 hr tablet TAKE 1 TABLET (25 MG TOTAL) BY MOUTH DAILY.       No facility-administered encounter medications on file as of 10/31/2015.      Hypertension This is a chronic problem. The current episode started more than 1 year ago. The problem has been resolved since onset. The problem is controlled. Associated symptoms include shortness of breath (DOE). Pertinent negatives include no anxiety, blurred vision, palpitations or peripheral edema. Risk factors for coronary artery disease include post-menopausal state. The current treatment provides significant improvement. There is no history of a thyroid problem. There is no history of sleep apnea.  Osteoarthritis bil knees Sees Dr. Hilda Lias- she has appointment tomorrow and is currently taking hydrocodone prescribe by Dr. Hilda Lias for pain.     Review of Systems  Eyes: Negative for blurred vision.  Respiratory: Positive for shortness of breath (DOE).   Cardiovascular: Negative for palpitations.  All other systems reviewed and are negative.      Objective:   Physical Exam  Constitutional: She is oriented to person, place, and time. She appears well-developed and well-nourished.  HENT:  Head: Normocephalic.  Right Ear: External ear normal.  Left Ear: External ear normal.  Mouth/Throat: Oropharynx is clear and moist.  Eyes: Pupils are equal, round, and reactive to light.  Neck: Normal range of motion. Neck supple. No thyromegaly present.  Cardiovascular: Normal rate,  regular rhythm, normal heart sounds and intact distal pulses.   Pulmonary/Chest: Effort normal and breath sounds normal. Right breast exhibits inverted nipple. Right breast exhibits no mass, no nipple discharge, no skin change and no tenderness. Left breast exhibits no inverted nipple, no mass, no nipple discharge, no skin change and no tenderness.  Abdominal: Soft. Bowel sounds are normal. She exhibits no distension. There is no tenderness.  Musculoskeletal: Normal range of motion. She exhibits no edema or tenderness.  Neurological: She is alert and oriented to person, place, and time.  Skin: Skin is warm and dry.  Mobile 2cm annular nontender nodules left forearm and elbow.  Psychiatric: She has a normal mood and affect. Her behavior is normal. Judgment and thought content normal.  Vitals reviewed.   BP 134/86 mmHg  Pulse 82  Temp(Src) 98.4 F (36.9 C) (Oral)  Ht 5\' 2"  (1.575 m)  Wt 160 lb (72.576 kg)  BMI 29.26 kg/m2     Assessment & Plan:  1. Essential hypertension Do not add salt to diet - metoprolol succinate (TOPROL-XL) 25 MG 24 hr tablet; TAKE 1 TABLET (25 MG TOTAL) BY MOUTH DAILY.  Dispense: 30 tablet; Refill: 5 - chlorthalidone (HYGROTON) 25 MG tablet; TAKE 1 TABLET (25 MG TOTAL) BY MOUTH DAILY.  Dispense: 30 tablet; Refill: 5 - CMP14+EGFR - Lipid panel  2. Primary osteoarthritis of both knees Rest when hurting  3. BMI 29.0-29.9,adult Discussed diet and exercise for person with BMI >25 Will recheck weight in 3-6 months    Labs pending Health maintenance reviewed Diet and exercise encouraged Continue all meds Follow up  In  6 months   [place], FNP

## 2015-11-01 LAB — CMP14+EGFR
ALBUMIN: 3.9 g/dL (ref 3.5–4.8)
ALK PHOS: 59 IU/L (ref 39–117)
ALT: 16 IU/L (ref 0–32)
AST: 25 IU/L (ref 0–40)
Albumin/Globulin Ratio: 1.5 (ref 1.1–2.5)
BUN / CREAT RATIO: 15 (ref 11–26)
BUN: 14 mg/dL (ref 8–27)
Bilirubin Total: 0.5 mg/dL (ref 0.0–1.2)
CO2: 32 mmol/L — AB (ref 18–29)
CREATININE: 0.96 mg/dL (ref 0.57–1.00)
Calcium: 9.6 mg/dL (ref 8.7–10.3)
Chloride: 96 mmol/L (ref 96–106)
GFR calc non Af Amer: 57 mL/min/{1.73_m2} — ABNORMAL LOW (ref >59)
GFR, EST AFRICAN AMERICAN: 66 mL/min/{1.73_m2} (ref >59)
GLOBULIN, TOTAL: 2.6 g/dL (ref 1.5–4.5)
Glucose: 143 mg/dL — ABNORMAL HIGH (ref 65–99)
Potassium: 3.8 mmol/L (ref 3.5–5.2)
SODIUM: 143 mmol/L (ref 134–144)
Total Protein: 6.5 g/dL (ref 6.0–8.5)

## 2015-11-01 LAB — LIPID PANEL
CHOL/HDL RATIO: 2.8 ratio (ref 0.0–4.4)
CHOLESTEROL TOTAL: 156 mg/dL (ref 100–199)
HDL: 56 mg/dL (ref >39)
LDL CALC: 68 mg/dL (ref 0–99)
Triglycerides: 160 mg/dL — ABNORMAL HIGH (ref 0–149)
VLDL CHOLESTEROL CAL: 32 mg/dL (ref 5–40)

## 2015-11-02 ENCOUNTER — Ambulatory Visit: Payer: Self-pay | Admitting: Orthopaedic Surgery

## 2015-11-07 ENCOUNTER — Ambulatory Visit (INDEPENDENT_AMBULATORY_CARE_PROVIDER_SITE_OTHER): Payer: Commercial Managed Care - HMO | Admitting: Orthopaedic Surgery

## 2015-11-07 ENCOUNTER — Encounter: Payer: Self-pay | Admitting: Orthopaedic Surgery

## 2015-11-07 VITALS — BP 113/70 | HR 73 | Temp 97.3°F | Resp 16 | Ht 63.0 in | Wt 158.0 lb

## 2015-11-07 DIAGNOSIS — M25561 Pain in right knee: Secondary | ICD-10-CM | POA: Diagnosis not present

## 2015-11-07 DIAGNOSIS — M25562 Pain in left knee: Secondary | ICD-10-CM | POA: Diagnosis not present

## 2015-11-07 MED ORDER — HYDROCODONE-ACETAMINOPHEN 7.5-325 MG PO TABS: 1.0000 | ORAL_TABLET | ORAL | Status: DC | PRN

## 2015-11-07 NOTE — Progress Notes (Signed)
Chief complaint:  My knees hurt  She has bilateral knee pain.  She is not an operative candidate.  She receives injections in both knees periodically.  She has no new trauma, no giving way, no redness.  The patient request injection, verbal consent was obtained.  The right knee was prepped appropriately after time out was performed.   Sterile technique was observed and injection of 1 cc of Depo-Medrol 40 mg with several cc's of plain xylocaine. Anesthesia was provided by ethyl chloride and a 20-gauge needle was used to inject the knee area. The injection was tolerated well.  A band aid dressing was applied.  The patient was advised to apply ice later today and tomorrow to the injection site as needed.  The patient request injection, verbal consent was obtained.  The left knee was prepped appropriately after time out was performed.   Sterile technique was observed and injection of 1 cc of Depo-Medrol 40 mg with several cc's of plain xylocaine. Anesthesia was provided by ethyl chloride and a 20-gauge needle was used to inject the knee area. The injection was tolerated well.  A band aid dressing was applied.  The patient was advised to apply ice later today and tomorrow to the injection site as needed.  RTC one month.  Call if any problems.

## 2015-11-07 NOTE — Patient Instructions (Signed)
Continue medicines Continue exercises

## 2015-12-05 ENCOUNTER — Ambulatory Visit (INDEPENDENT_AMBULATORY_CARE_PROVIDER_SITE_OTHER): Payer: Commercial Managed Care - HMO | Admitting: Orthopaedic Surgery

## 2015-12-05 VITALS — BP 116/76 | HR 65 | Ht 63.0 in | Wt 158.0 lb

## 2015-12-05 DIAGNOSIS — M25561 Pain in right knee: Secondary | ICD-10-CM

## 2015-12-05 DIAGNOSIS — M25562 Pain in left knee: Secondary | ICD-10-CM

## 2015-12-05 MED ORDER — HYDROCODONE-ACETAMINOPHEN 7.5-325 MG PO TABS: 1.0000 | ORAL_TABLET | ORAL | Status: DC | PRN

## 2015-12-05 NOTE — Progress Notes (Signed)
CC:  My bilateral knees are still hurting.  I would like an injection.  The patient has had chronic pain and tenderness of both knees for some time.  Injections help.  There is no locking or giving way of the knee.  There is no new trauma.  The bilateral knees have a mild effusion and some crepitus.  There is no redness or signs of recent trauma.  Impression:  Chronic pain of the bilateral knees  Return:  4 weeks.  PROCEDURE NOTE:  The patient request injection, verbal consent was obtained.  Theright knee was prepped appropriately after time out was performed.   Sterile technique was observed and injection of 1 cc of Depo-Medrol 40 mg with several cc's of plain xylocaine. Anesthesia was provided by ethyl chloride and a 20-gauge needle was used to inject the knee area. The injection was tolerated well.  A band aid dressing was applied.  The patient was advised to apply ice later today and tomorrow to the injection sight as needed.  PROCEDURE NOTE:  The patient request injection, verbal consent was obtained.  Theleft knee was prepped appropriately after time out was performed.   Sterile technique was observed and injection of 1 cc of Depo-Medrol 40 mg with several cc's of plain xylocaine. Anesthesia was provided by ethyl chloride and a 20-gauge needle was used to inject the knee area. The injection was tolerated well.  A band aid dressing was applied.  The patient was advised to apply ice later today and tomorrow to the injection sight as needed.

## 2016-01-02 ENCOUNTER — Ambulatory Visit: Payer: Commercial Managed Care - HMO | Admitting: Orthopaedic Surgery

## 2016-01-03 ENCOUNTER — Ambulatory Visit (INDEPENDENT_AMBULATORY_CARE_PROVIDER_SITE_OTHER): Payer: Commercial Managed Care - HMO | Admitting: Orthopaedic Surgery

## 2016-01-03 ENCOUNTER — Encounter: Payer: Self-pay | Admitting: Orthopaedic Surgery

## 2016-01-03 VITALS — BP 107/65 | HR 76 | Temp 97.7°F | Resp 16 | Ht 63.0 in | Wt 158.0 lb

## 2016-01-03 DIAGNOSIS — M25562 Pain in left knee: Secondary | ICD-10-CM | POA: Diagnosis not present

## 2016-01-03 DIAGNOSIS — M25561 Pain in right knee: Secondary | ICD-10-CM

## 2016-01-03 MED ORDER — HYDROCODONE-ACETAMINOPHEN 7.5-325 MG PO TABS: 1.0000 | ORAL_TABLET | ORAL | Status: DC | PRN

## 2016-01-03 NOTE — Progress Notes (Signed)
Patient ID: Norma Barton, female   DOB: 01/20/1935, 81 y.o.   MRN: [id number]  CC: Both of my knees are hurting. I would like an injection in both knees.  The patient has had chronic pain and tenderness of both knees for some time.  Injections help.  There is no locking or giving way of the knee.  There is no new trauma. There is no redness or signs of infections.  The knees have a mild effusion and some crepitus.  There is no redness or signs of recent trauma. ROM of right is 0-100 and 0-105 left knee.  Impression:  Chronic pain of the both knees  Return:  One month.  PROCEDURE NOTE:  The patient requests injections of the left knee , verbal consent was obtained.  The left knee was prepped appropriately after time out was performed.   Sterile technique was observed and injection of 1 cc of Depo-Medrol 40 mg with several cc's of plain xylocaine. Anesthesia was provided by ethyl chloride and a 20-gauge needle was used to inject the knee area. The injection was tolerated well.  A band aid dressing was applied.  The patient was advised to apply ice later today and tomorrow to the injection sight as needed.  PROCEDURE NOTE:  The patient requests injections of the right knee , verbal consent was obtained.  The right knee was prepped appropriately after time out was performed.   Sterile technique was observed and injection of 1 cc of Depo-Medrol 40 mg with several cc's of plain xylocaine. Anesthesia was provided by ethyl chloride and a 20-gauge needle was used to inject the knee area. The injection was tolerated well.  A band aid dressing was applied.  The patient was advised to apply ice later today and tomorrow to the injection sight as needed.

## 2016-01-30 ENCOUNTER — Telehealth: Payer: Self-pay | Admitting: Nurse Practitioner

## 2016-01-30 NOTE — Telephone Encounter (Signed)
Pt notified of referral Verbalizes understanding 

## 2016-01-31 ENCOUNTER — Ambulatory Visit (INDEPENDENT_AMBULATORY_CARE_PROVIDER_SITE_OTHER): Payer: Commercial Managed Care - HMO | Admitting: Orthopaedic Surgery

## 2016-01-31 ENCOUNTER — Encounter: Payer: Self-pay | Admitting: Orthopaedic Surgery

## 2016-01-31 VITALS — BP 125/82 | HR 71 | Temp 97.7°F | Ht 63.0 in | Wt 159.0 lb

## 2016-01-31 DIAGNOSIS — M25562 Pain in left knee: Secondary | ICD-10-CM | POA: Diagnosis not present

## 2016-01-31 DIAGNOSIS — M25561 Pain in right knee: Secondary | ICD-10-CM

## 2016-01-31 MED ORDER — HYDROCODONE-ACETAMINOPHEN 7.5-325 MG PO TABS: 1.0000 | ORAL_TABLET | ORAL | Status: DC | PRN

## 2016-01-31 NOTE — Progress Notes (Signed)
CC: Both of my knees are hurting. I would like an injection in both knees.  The patient has had chronic pain and tenderness of both knees for some time.  Injections help.  There is no locking or giving way of the knee.  There is no new trauma. There is no redness or signs of infections.  The knees have a mild effusion and some crepitus.  There is no redness or signs of recent trauma.  Impression:  Chronic pain of the both knees  Return:  One month  PROCEDURE NOTE:  The patient requests injections of the left knee , verbal consent was obtained.  The left knee was prepped appropriately after time out was performed.   Sterile technique was observed and injection of 1 cc of Depo-Medrol 40 mg with several cc's of plain xylocaine. Anesthesia was provided by ethyl chloride and a 20-gauge needle was used to inject the knee area. The injection was tolerated well.  A band aid dressing was applied.  The patient was advised to apply ice later today and tomorrow to the injection sight as needed.  PROCEDURE NOTE:  The patient requests injections of the right knee , verbal consent was obtained.  The right knee was prepped appropriately after time out was performed.   Sterile technique was observed and injection of 1 cc of Depo-Medrol 40 mg with several cc's of plain xylocaine. Anesthesia was provided by ethyl chloride and a 20-gauge needle was used to inject the knee area. The injection was tolerated well.  A band aid dressing was applied.  The patient was advised to apply ice later today and tomorrow to the injection sight as needed.   

## 2016-02-28 ENCOUNTER — Encounter: Payer: Self-pay | Admitting: Orthopaedic Surgery

## 2016-02-28 ENCOUNTER — Ambulatory Visit (INDEPENDENT_AMBULATORY_CARE_PROVIDER_SITE_OTHER): Payer: Commercial Managed Care - HMO | Admitting: Orthopaedic Surgery

## 2016-02-28 VITALS — BP 115/70 | HR 68 | Temp 96.8°F | Ht 62.5 in | Wt 161.0 lb

## 2016-02-28 DIAGNOSIS — M25562 Pain in left knee: Secondary | ICD-10-CM

## 2016-02-28 DIAGNOSIS — M25561 Pain in right knee: Secondary | ICD-10-CM

## 2016-02-28 MED ORDER — HYDROCODONE-ACETAMINOPHEN 7.5-325 MG PO TABS: 1.0000 | ORAL_TABLET | ORAL | Status: DC | PRN

## 2016-02-28 NOTE — Progress Notes (Signed)
CC: Both of my knees are hurting. I would like an injection in both knees.  The patient has had chronic pain and tenderness of both knees for some time.  Injections help.  There is no locking or giving way of the knee.  There is no new trauma. There is no redness or signs of infections.  The knees have a mild effusion and some crepitus.  There is no redness or signs of recent trauma.  Impression:  Chronic pain of the both knees  Return:  One month.  PROCEDURE NOTE:  The patient requests injections of the left knee , verbal consent was obtained.  The left knee was prepped appropriately after time out was performed.   Sterile technique was observed and injection of 1 cc of Depo-Medrol 80 mg with several cc's of plain xylocaine. Anesthesia was provided by ethyl chloride and a 20-gauge needle was used to inject the knee area. The injection was tolerated well.  A band aid dressing was applied.  The patient was advised to apply ice later today and tomorrow to the injection sight as needed.  PROCEDURE NOTE:  The patient requests injections of the right knee , verbal consent was obtained.  The right knee was prepped appropriately after time out was performed.   Sterile technique was observed and injection of 1 cc of Depo-Medrol 40 mg with several cc's of plain xylocaine. Anesthesia was provided by ethyl chloride and a 20-gauge needle was used to inject the knee area. The injection was tolerated well.  A band aid dressing was applied.  The patient was advised to apply ice later today and tomorrow to the injection sight as needed.  Electronically Signed Darreld McleanWayne Koichi Platte, MD 6/14/20173:38 PM

## 2016-03-27 ENCOUNTER — Ambulatory Visit: Payer: Commercial Managed Care - HMO | Admitting: Orthopaedic Surgery

## 2016-04-02 ENCOUNTER — Ambulatory Visit (INDEPENDENT_AMBULATORY_CARE_PROVIDER_SITE_OTHER): Payer: Commercial Managed Care - HMO | Admitting: Orthopaedic Surgery

## 2016-04-02 ENCOUNTER — Encounter: Payer: Self-pay | Admitting: Orthopaedic Surgery

## 2016-04-02 VITALS — BP 112/71 | HR 67 | Temp 97.3°F | Ht 62.5 in | Wt 160.0 lb

## 2016-04-02 DIAGNOSIS — M25561 Pain in right knee: Secondary | ICD-10-CM

## 2016-04-02 DIAGNOSIS — M25562 Pain in left knee: Secondary | ICD-10-CM | POA: Diagnosis not present

## 2016-04-02 MED ORDER — HYDROCODONE-ACETAMINOPHEN 7.5-325 MG PO TABS: 1.0000 | ORAL_TABLET | ORAL | Status: DC | PRN

## 2016-04-02 NOTE — Progress Notes (Signed)
CC: Both of my knees are hurting. I would like an injection in both knees.  The patient has had chronic pain and tenderness of both knees for some time.  Injections help.  There is no locking or giving way of the knee.  There is no new trauma. There is no redness or signs of infections.  The knees have a mild effusion and some crepitus.  There is no redness or signs of recent trauma.  Right knee ROM is 0-105 and left knee ROM is 0-110.  Impression:  Chronic pain of the both knees  Return:  1 month  PROCEDURE NOTE:  The patient requests injections of both knees, verbal consent was obtained.  The left and right knee were individually prepped appropriately after time out was performed.   Sterile technique was observed and injection of 1 cc of Depo-Medrol 40 mg with several cc's of plain xylocaine. Anesthesia was provided by ethyl chloride and a 20-gauge needle was used to inject each knee area. The injections were tolerated well.  A band aid dressing was applied.  The patient was advised to apply ice later today and tomorrow to the injection sight as needed.  Electronically Signed Darreld McleanWayne Imanol Bihl, MD 7/18/20173:03 PM

## 2016-04-19 ENCOUNTER — Other Ambulatory Visit: Payer: Self-pay | Admitting: Nurse Practitioner

## 2016-04-19 DIAGNOSIS — I10 Essential (primary) hypertension: Secondary | ICD-10-CM

## 2016-05-02 ENCOUNTER — Ambulatory Visit (INDEPENDENT_AMBULATORY_CARE_PROVIDER_SITE_OTHER): Payer: Commercial Managed Care - HMO | Admitting: Orthopaedic Surgery

## 2016-05-02 ENCOUNTER — Encounter: Payer: Self-pay | Admitting: Orthopaedic Surgery

## 2016-05-02 VITALS — BP 105/73 | HR 73 | Temp 97.7°F | Ht 62.5 in | Wt 162.0 lb

## 2016-05-02 DIAGNOSIS — M25562 Pain in left knee: Secondary | ICD-10-CM

## 2016-05-02 DIAGNOSIS — M25561 Pain in right knee: Secondary | ICD-10-CM | POA: Diagnosis not present

## 2016-05-02 MED ORDER — HYDROCODONE-ACETAMINOPHEN 7.5-325 MG PO TABS: 1.0000 | ORAL_TABLET | ORAL | 0 refills | Status: DC | PRN

## 2016-05-02 NOTE — Progress Notes (Signed)
CC: Both of my knees are hurting. I would like an injection in both knees.  The patient has had chronic pain and tenderness of both knees for some time.  Injections help.  There is no locking or giving way of the knee.  There is no new trauma. There is no redness or signs of infections.  The knees have a mild effusion and some crepitus.  There is no redness or signs of recent trauma.  Right knee ROM is 0-95 and left knee ROM is 0-100.  Impression:  Chronic pain of the both knees  Return:  1 month  PROCEDURE NOTE:  The patient requests injections of both knees, verbal consent was obtained.  The left and right knee were individually prepped appropriately after time out was performed.   Sterile technique was observed and injection of 1 cc of Depo-Medrol 40 mg with several cc's of plain xylocaine. Anesthesia was provided by ethyl chloride and a 20-gauge needle was used to inject each knee area. The injections were tolerated well.  A band aid dressing was applied.  The patient was advised to apply ice later today and tomorrow to the injection sight as needed.   Electronically Signed Darreld McleanWayne Kerby Borner, MD 8/17/20173:29 PM

## 2016-05-07 ENCOUNTER — Other Ambulatory Visit: Payer: Self-pay | Admitting: Nurse Practitioner

## 2016-05-07 DIAGNOSIS — I10 Essential (primary) hypertension: Secondary | ICD-10-CM

## 2016-05-16 IMAGING — CR DG CHEST 2V
2 series · 2 of 2 positions shown · non-contrast
Comparison: None.

CLINICAL DATA: History of hypertension, yearly examination.

EXAM:
CHEST  2 VIEW

[view not recorded (1 of 2)]
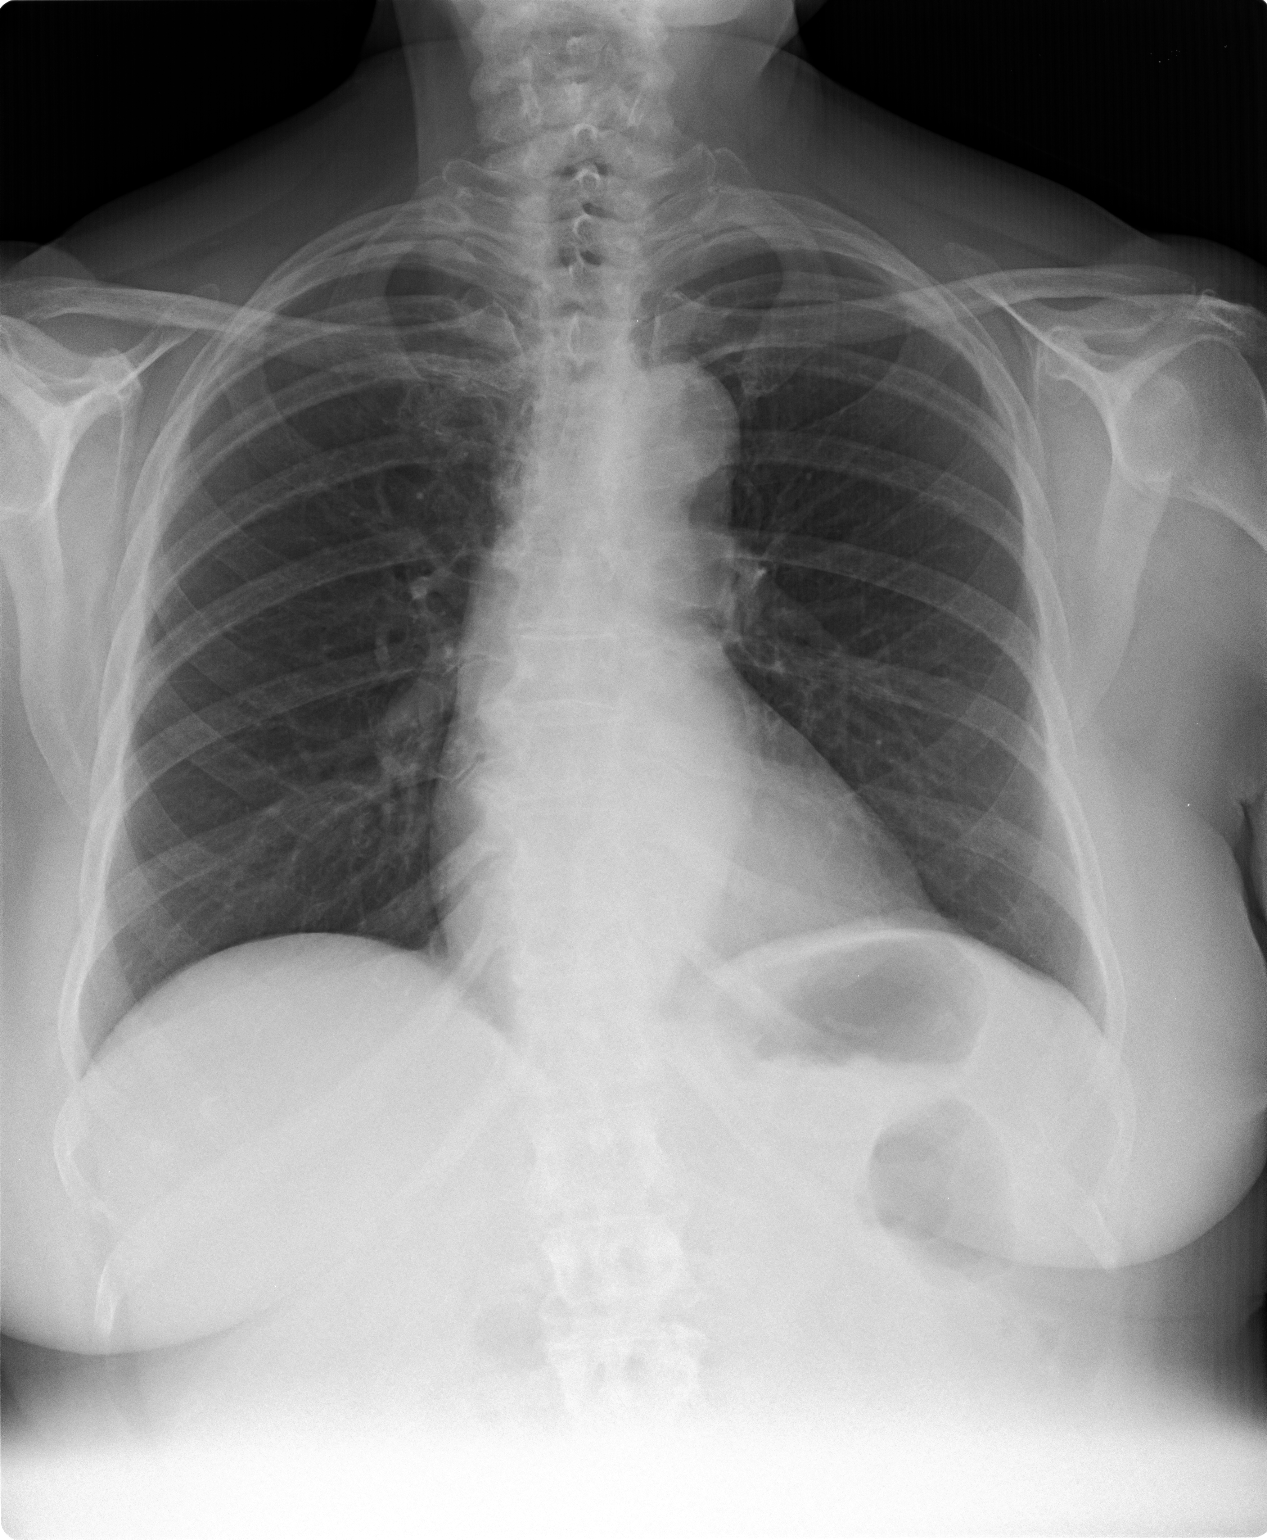

[view not recorded (2 of 2)]
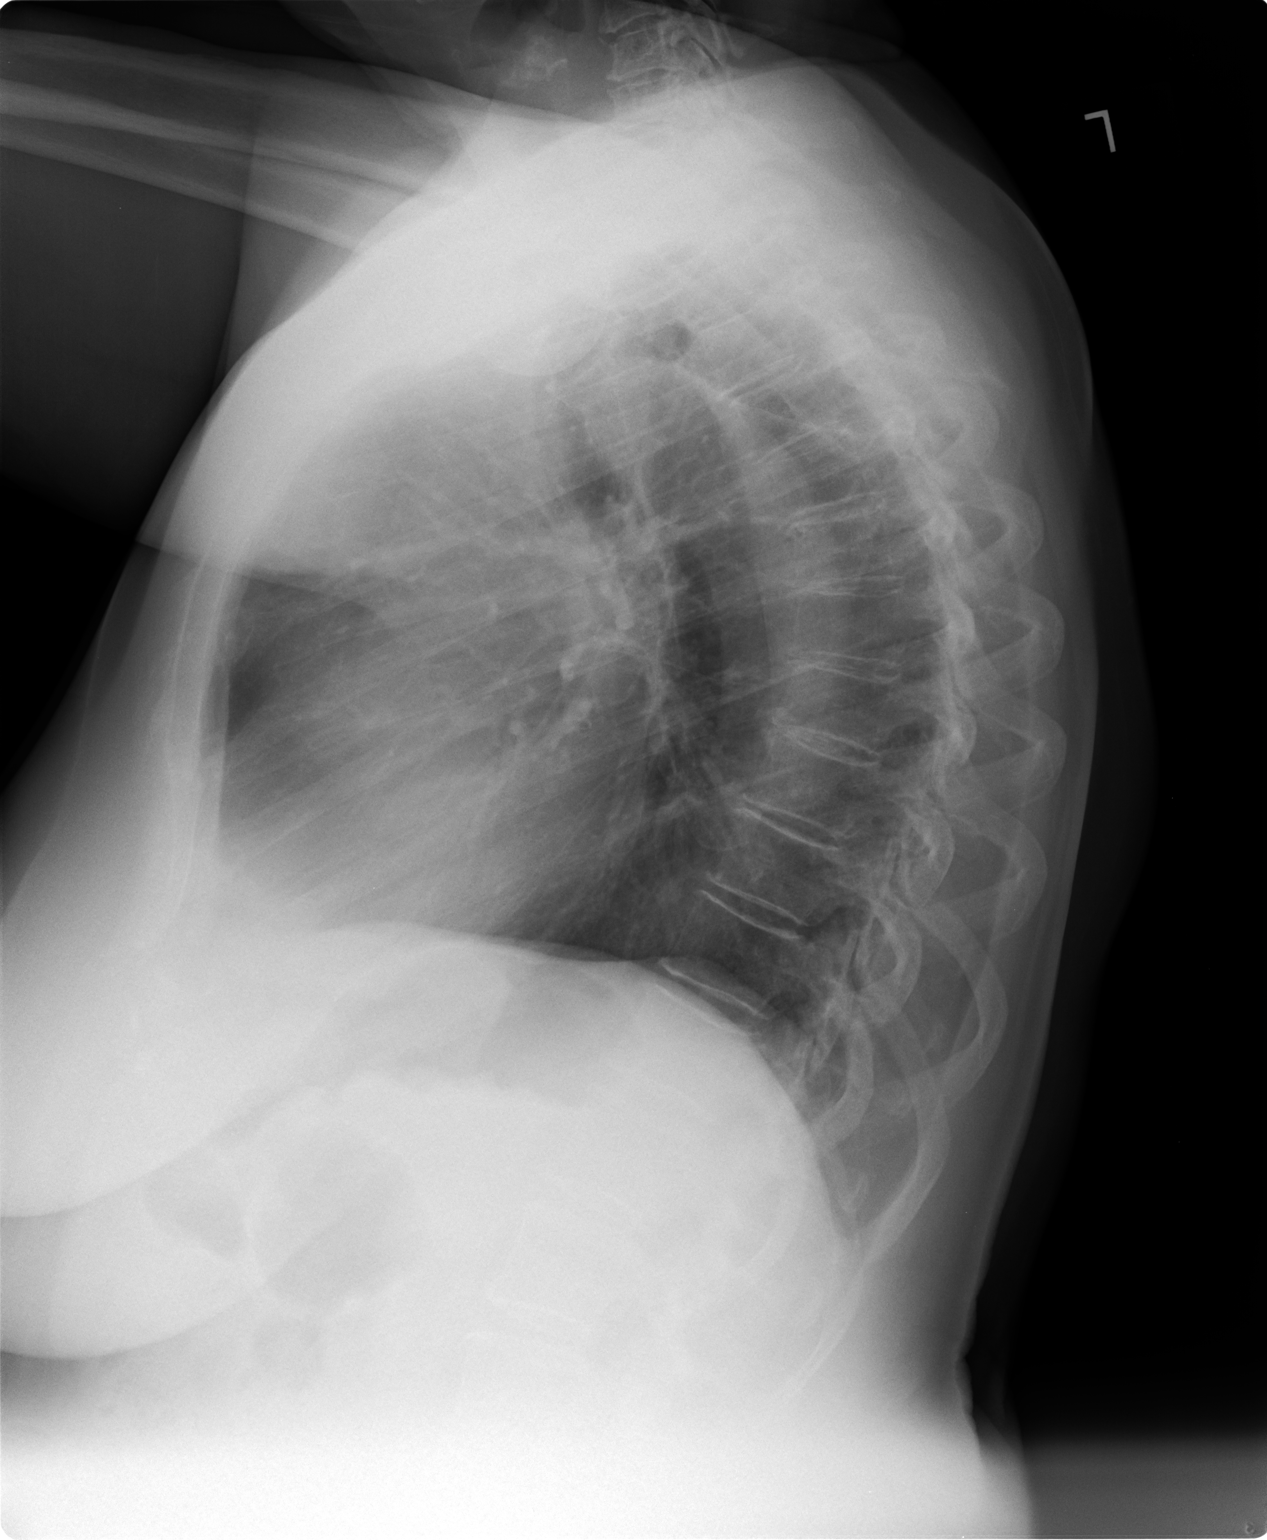

[2 of 2 positions shown; findings below may reference images not displayed]

FINDINGS: The lungs are adequately inflated and clear. The heart and pulmonary
vascularity are normal. The mediastinum is normal in width. There is
no pleural effusion. The bony thorax is unremarkable.
IMPRESSION: There is no active cardiopulmonary disease.

## 2016-05-17 ENCOUNTER — Other Ambulatory Visit: Payer: Self-pay | Admitting: Nurse Practitioner

## 2016-05-17 DIAGNOSIS — I10 Essential (primary) hypertension: Secondary | ICD-10-CM

## 2016-06-04 ENCOUNTER — Encounter: Payer: Self-pay | Admitting: Orthopaedic Surgery

## 2016-06-04 ENCOUNTER — Ambulatory Visit (INDEPENDENT_AMBULATORY_CARE_PROVIDER_SITE_OTHER): Payer: Commercial Managed Care - HMO

## 2016-06-04 ENCOUNTER — Ambulatory Visit (INDEPENDENT_AMBULATORY_CARE_PROVIDER_SITE_OTHER): Payer: Commercial Managed Care - HMO | Admitting: Orthopaedic Surgery

## 2016-06-04 VITALS — BP 109/74 | HR 69 | Temp 97.9°F | Ht 62.25 in | Wt 158.0 lb

## 2016-06-04 DIAGNOSIS — M25561 Pain in right knee: Secondary | ICD-10-CM

## 2016-06-04 DIAGNOSIS — M25562 Pain in left knee: Secondary | ICD-10-CM

## 2016-06-04 MED ORDER — HYDROCODONE-ACETAMINOPHEN 7.5-325 MG PO TABS: 1.0000 | ORAL_TABLET | Freq: Four times a day (QID) | ORAL | 0 refills | Status: DC | PRN

## 2016-06-04 NOTE — Progress Notes (Signed)
Patient Norma Barton, female DOB:1935-04-13, 81 y.o. AVW:[id number]  Chief Complaint  Patient presents with  . Follow-up    bilateral knee pain, left worse than right.    HPI  Norma Barton is a 81 y.o. female who has history of chronic pain of the knees, more on the left than right.  Over the last week the left knee has suddenly become much more painful.  She is now using a cane.  She has bad pain medial left knee. She is very concerned about this.  Her son comes with her today. HPI  Body mass index is 28.67 kg/m.  ROS  Review of Systems  Respiratory: Negative for cough and shortness of breath.   Cardiovascular: Positive for leg swelling. Negative for chest pain.  Endocrine: Positive for cold intolerance.  Musculoskeletal: Positive for arthralgias, gait problem and joint swelling.  Allergic/Immunologic: Positive for environmental allergies.    Past Medical History:  Diagnosis Date  . Hypertension     Past Surgical History:  Procedure Laterality Date  . ABDOMINAL HYSTERECTOMY    . arthroscopy right knee      History of hypertension and heart disease in the family. Social History Social History  Substance Use Topics  . Smoking status: Never Smoker  . Smokeless tobacco: Never Used  . Alcohol use No    No Known Allergies  Current Outpatient Prescriptions  Medication Sig Dispense Refill  . chlorthalidone (HYGROTON) 25 MG tablet TAKE 1 TABLET (25 MG TOTAL) BY MOUTH DAILY. 30 tablet 0  . HYDROcodone-acetaminophen (NORCO) 7.5-325 MG tablet Take 1 tablet by mouth every 4 (four) hours as needed for moderate pain (Must last 30 days.  Do not drive or operate machinery while taking this medicine.). 120 tablet 0  . metoprolol succinate (TOPROL-XL) 25 MG 24 hr tablet TAKE 1 TABLET (25 MG TOTAL) BY MOUTH DAILY. 30 tablet 4   No current facility-administered medications for this visit.      Physical Exam  Blood pressure 109/74, pulse 69, temperature 97.9 F (36.6  C), height 5' 2.25" (1.581 m), weight 158 lb (71.7 kg).  Constitutional: overall normal hygiene, normal nutrition, well developed, normal grooming, normal body habitus. Assistive device:cane  Musculoskeletal: gait and station Limp left, muscle tone and strength are normal, no tremors or atrophy is present.  .  Neurological: coordination overall normal.  Deep tendon reflex/nerve stretch intact.  Sensation normal.  Cranial nerves II-XII intact.   Skin:   Normal overall no scars, lesions, ulcers or rashes. No psoriasis.  Psychiatric: Alert and oriented x 3.  Recent memory intact, remote memory unclear.  Normal mood and affect. Well groomed.  Good eye contact.  Cardiovascular: overall no swelling, no varicosities, no edema bilaterally, normal temperatures of the legs and arms, no clubbing, cyanosis and good capillary refill.  Lymphatic: palpation is normal.  The bilateral lower extremity is examined:  Inspection:  Thigh:  Non-tender and no defects  Knee has swelling 1+ effusion.                        Joint tenderness is present                        Patient is tender over the medial joint line  Lower Leg:  Has normal appearance and no tenderness or defects  Ankle:  Non-tender and no defects  Foot:  Non-tender and no defects Range of Motion:  Knee:  Range of  motion is: 0-105 right, 0-100 left, more pain left                        Crepitus is  present  Ankle:  Range of motion is normal. Strength and Tone:  The bilateral lower extremity has normal strength and tone. Stability:  Knee:  The knee is stable.  Ankle:  The ankle is stable.    The patient has been educated about the nature of the problem(s) and counseled on treatment options.  The patient appeared to understand what I have discussed and is in agreement with it.  Encounter Diagnoses  Name Primary?  . Left knee pain Yes  . Right knee pain     PLAN Call if any problems.  Precautions discussed.  Continue current  medications.   Return to clinic see Dr. Romeo AppleHarrison for knee surgery evaluation.    PROCEDURE NOTE:  The patient requests injections of both knees, verbal consent was obtained.  The left and right knee were individually prepped appropriately after time out was performed.   Sterile technique was observed and injection of 1 cc of Depo-Medrol 40 mg with several cc's of plain xylocaine. Anesthesia was provided by ethyl chloride and a 20-gauge needle was used to inject each knee area. The injections were tolerated well.  A band aid dressing was applied.  The patient was advised to apply ice later today and tomorrow to the injection sight as needed.       Electronically Signed Darreld McleanWayne Kimaria Struthers, MD 9/19/20174:08 PM

## 2016-06-04 NOTE — Addendum Note (Signed)
Addended by: Earnstine RegalKEELING, JOHN W on: 06/04/2016 04:19 PM   Modules accepted: Orders

## 2016-06-04 NOTE — Progress Notes (Signed)
Past Medical History:  Diagnosis Date  . Hypertension    Past Surgical History:  Procedure Laterality Date  . ABDOMINAL HYSTERECTOMY    . arthroscopy right knee

## 2016-06-05 ENCOUNTER — Telehealth: Payer: Self-pay | Admitting: Nurse Practitioner

## 2016-06-13 ENCOUNTER — Telehealth: Payer: Self-pay | Admitting: Orthopaedic Surgery

## 2016-06-13 NOTE — Telephone Encounter (Signed)
Patient called saying that she had [facility] They told her that she could stay [facility] the [facility] [place] for 20 days for free. She wanted you to know.

## 2016-06-14 NOTE — Telephone Encounter (Signed)
Referral done

## 2016-06-24 ENCOUNTER — Other Ambulatory Visit: Payer: Self-pay | Admitting: Nurse Practitioner

## 2016-06-24 DIAGNOSIS — I10 Essential (primary) hypertension: Secondary | ICD-10-CM

## 2016-06-25 ENCOUNTER — Ambulatory Visit (INDEPENDENT_AMBULATORY_CARE_PROVIDER_SITE_OTHER): Payer: Commercial Managed Care - HMO

## 2016-06-25 DIAGNOSIS — Z23 Encounter for immunization: Secondary | ICD-10-CM

## 2016-07-03 NOTE — Telephone Encounter (Signed)
Patient has called back regarding deciding about total knee surgery.  Needs to be scheduled with Dr Romeo AppleHarrison for appointment/evaluation, per Dr Sanjuan DameKeeling's notes for referral.

## 2016-07-04 ENCOUNTER — Telehealth: Payer: Self-pay | Admitting: Orthopaedic Surgery

## 2016-07-04 MED ORDER — HYDROCODONE-ACETAMINOPHEN 7.5-325 MG PO TABS: 1.0000 | ORAL_TABLET | Freq: Four times a day (QID) | ORAL | 0 refills | Status: DC | PRN

## 2016-07-04 NOTE — Telephone Encounter (Signed)
OK 

## 2016-07-04 NOTE — Telephone Encounter (Signed)
Patient called and requested a refill on Hydrocodone/Acetaminophen 7.5-325 mgs.   Qty  120   Sig: Take 1 tablet by mouth every 6 (six) hours as needed for moderate pain (Must last 30 days.Do not drive or operate machinery while taking this medicine.).

## 2016-07-05 NOTE — Telephone Encounter (Signed)
Called patient; appointment scheduled; [employer] authorization/referral received; patient aware.

## 2016-07-09 ENCOUNTER — Ambulatory Visit (INDEPENDENT_AMBULATORY_CARE_PROVIDER_SITE_OTHER): Payer: Commercial Managed Care - HMO

## 2016-07-09 ENCOUNTER — Ambulatory Visit (INDEPENDENT_AMBULATORY_CARE_PROVIDER_SITE_OTHER): Payer: Commercial Managed Care - HMO | Admitting: Orthopedic Surgery

## 2016-07-09 ENCOUNTER — Encounter: Payer: Self-pay | Admitting: Orthopedic Surgery

## 2016-07-09 DIAGNOSIS — M25562 Pain in left knee: Secondary | ICD-10-CM | POA: Diagnosis not present

## 2016-07-09 DIAGNOSIS — M1712 Unilateral primary osteoarthritis, left knee: Secondary | ICD-10-CM

## 2016-07-09 DIAGNOSIS — G8929 Other chronic pain: Secondary | ICD-10-CM

## 2016-07-09 NOTE — Patient Instructions (Addendum)
You have decided to proceed with knee replacement surgery. You have decided not to continue with nonoperative measures such as but not limited to oral medication, weight loss, activity modification, physical therapy, bracing, or injection.  We will perform the procedure commonly known as total knee replacement. Some of the risks associated with knee replacement surgery include but are not limited to Bleeding Infection Swelling Stiffness Blood clot Pain that persists even after surgery  Infection is especially devastating complication of knee surgery although rare. If infection does occur your implant will usually have to be removed and several surgeries and antibiotics will be needed to eradicate the infection prior to performing a repeat replacement.   If you're not comfortable with these risks and would like to continue with nonoperative treatment please let Dr. Omie Ferger know prior to your surgery.   Total Knee Replacement Total knee replacement is a procedure to replace your knee joint with an artificial knee joint (prosthetic knee joint). The purpose of this surgery is to reduce pain and improve your knee function. LET YOUR HEALTH CARE PROVIDER KNOW ABOUT:   Any allergies you have.  All medicines you are taking, including vitamins, herbs, eye drops, creams, and over-the-counter medicines.  Previous problems you or members of your family have had with the use of anesthetics.  Any blood disorders you have.  Previous surgeries you have had.  Medical conditions you have. RISKS AND COMPLICATIONS  Generally, total knee replacement is a safe procedure. However, problems can occur, including:  Loss of range of motion of the knee or instability of the knee.  Loosening of the prosthesis.  Infection.  Persistent pain. BEFORE THE PROCEDURE   Plan to have someone take you home after the procedure.  Do not eat or drink anything after midnight on the night before the procedure or as  directed by your health care provider.  Ask your health care provider about:  Changing or stopping your regular medicines. This is especially important if you are taking diabetes medicines or blood thinners.  Taking medicines such as aspirin and ibuprofen. These medicines can thin your blood. Do not take these medicines before your procedure if your health care provider asks you not to.  Ask your health care provider about how your surgical site will be marked or identified.  You may be given antibiotic medicines to help prevent infection. PROCEDURE   To reduce your risk of infection:  Your health care team will wash or sanitize their hands.  Your skin will be washed with soap.  An IV tube will be inserted into one of your veins. You will be given one or more of the following:  A medicine that makes you drowsy (sedative).  A medicine that makes you fall asleep (general anesthetic).  A medicine injected into your spine that numbs your body below the waist (spinal anesthetic).  A medicine to block feeling in your leg (nerve block) to help ease pain after surgery.  An incision will be made in your knee. Your surgeon will take out any damaged cartilage and bone by sawing off the damaged surfaces.  The surgeon will then put a new metal liner over the sawed-off portion of your thigh bone (femur) and a plastic liner over the sawed-off portion of one of the bones of your lower leg (tibia). This is to restore alignment and function to your knee. A plastic piece is often used to restore the surface of your knee cap. AFTER THE PROCEDURE   You will stay   in a recovery area until your medicines wear off.  You may have drainage tubes to drain excess fluid from your knee. These tubes attach to a device that removes these fluids.  Once you are awake, stable, and taking fluids well, you will be taken to your hospital room.  You may be directed to take actions to help prevent blood clots. These  may include:  Walking shortly after surgery, with someone assisting you. Moving around after surgery helps to improve blood flow.  Wearing compression stockings or using different types of devices.  You will receive physical therapy as prescribed by your health care provider.   This information is not intended to replace advice given to you by your health care provider. Make sure you discuss any questions you have with your health care provider.   Document Released: 12/09/2000 Document Revised: 05/24/2015 Document Reviewed: 10/13/2011 Elsevier Interactive Patient Education 2016 [employer]  

## 2016-07-09 NOTE — Progress Notes (Signed)
Chief Complaint  Patient presents with  . Knee Pain    LEFT KNEE PAIN, DISCUSS SURGERY   81 yo female with long history of bilateral knee pain  She has failed non operative treatment and wishes to proceed with tka      Knee Pain   The pain is present in the left knee. The quality of the pain is described as aching. The pain is moderate. The pain has been constant since onset. Associated symptoms include a loss of motion. Pertinent negatives include no loss of sensation, muscle weakness, numbness or tingling. The symptoms are aggravated by movement and weight bearing. She has tried acetaminophen, NSAIDs and rest (injections) for the symptoms. The treatment provided mild relief.       Review of Systems  Musculoskeletal: Positive for joint pain.  Neurological: Negative for tingling and numbness.  All other systems reviewed and are negative.   Past Medical History:  Diagnosis Date  . Hypertension     Past Surgical History:  Procedure Laterality Date  . ABDOMINAL HYSTERECTOMY    . arthroscopy right knee     No family history on file. Social History  Substance Use Topics  . Smoking status: Never Smoker  . Smokeless tobacco: Never Used  . Alcohol use No   Current Meds  Medication Sig  . chlorthalidone (HYGROTON) 25 MG tablet TAKE 1 TABLET EVERY DAY  . HYDROcodone-acetaminophen (NORCO) 7.5-325 MG tablet Take 1 tablet by mouth every 6 (six) hours as needed for moderate pain (Must last 30 days.Do not drive or operate machinery while taking this medicine.).  Marland Kitchen metoprolol succinate (TOPROL-XL) 25 MG 24 hr tablet TAKE 1 TABLET (25 MG TOTAL) BY MOUTH DAILY.    There were no vitals taken for this visit.  Physical Exam  Constitutional: She is oriented to person, place, and time. She appears well-developed and well-nourished. No distress.  Cardiovascular: Normal rate and intact distal pulses.   Neurological: She is alert and oriented to person, place, and time. She has normal  reflexes. She exhibits normal muscle tone. Coordination normal.  Skin: Skin is warm and dry. No rash noted. She is not diaphoretic. No erythema. No pallor.  Psychiatric: She has a normal mood and affect. Her behavior is normal. Judgment and thought content normal.    Right Knee Exam   Tenderness  The patient is experiencing tenderness in the medial joint line.  Range of Motion  Extension: 5  Right knee flexion: 115.   Muscle Strength   The patient has normal right knee strength.  Tests  Drawer:       Anterior - negative    Posterior - negative  Other  Erythema: absent Scars: absent Sensation: normal Pulse: present Swelling: mild   Left Knee Exam   Other  Erythema: absent Scars: absent      Left knee small flexion contracture 5 or less flexion 115. Knee stable. Mild varus deformity. Tenderness medial compartment. Small joint effusion. Extensor mechanism strength is normal.    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  PTF XRAYS mild medial patellofemoral narrowing with medial femoral osteophyte PLAN This procedure has been fully reviewed with the patient and written informed consent has been obtained.  LEFT TKA PLAN  [facility]   Fuller CanadaStanley Esteban Kobashigawa, MD 07/09/2016 9:59 AM  .meds

## 2016-07-09 NOTE — Addendum Note (Signed)
Addended by: Adella HareBOOTHE, JAIME B on: 07/09/2016 11:52 AM   Modules accepted: Orders, SmartSet

## 2016-07-28 ENCOUNTER — Other Ambulatory Visit: Payer: Self-pay | Admitting: Nurse Practitioner

## 2016-07-28 DIAGNOSIS — I10 Essential (primary) hypertension: Secondary | ICD-10-CM

## 2016-08-06 NOTE — Patient Instructions (Signed)
Norma SearingBetty M Barton  08/06/2016     @PREFPERIOPPHARMACY @   Your procedure is scheduled on  08/15/2016   [facility] at  [facility]  [facility].M.  Call this number if you have problems the morning of surgery:  [phone]   Remember:  Do not eat food or drink liquids after midnight.  Take these medicines the morning of surgery with A SIP OF WATER  Norco, metoprolol.   Do not wear jewelry, make-up or nail polish.  Do not wear lotions, powders, or perfumes, or deoderant.  Do not shave 48 hours prior to surgery.  Men may shave face and neck.  Do not bring valuables to the hospital.  [facility] is not responsible for any belongings or valuables.  Contacts, dentures or bridgework may not be worn into surgery.  Leave your suitcase in the car.  After surgery it may be brought to your room.  For patients admitted to the hospital, discharge time will be determined by your treatment team.  Patients discharged the day of surgery will not be allowed to drive home.   Name and phone number of your driver:   family Special instructions:  None  Please read over the following fact sheets that you were given. Anesthesia Post-op Instructions and Care and Recovery After Surgery       Total Knee Replacement Total knee replacement is a surgery to replace your knee joint with a man-made (prosthetic) joint. The man-made joint is called a prosthesis. It replaces parts of the thigh bone (femur), lower leg bone (tibia), and kneecap (patella). This surgery is done to lessen pain and improve knee movement. What happens before the procedure?  Ask your doctor about:  Changing or stopping your normal medicines. This is especially important if you take diabetes medicines or blood thinners.  Taking medicines such as aspirin and ibuprofen. These medicines can thin your blood. Do not take these medicines before your procedure if your doctor tells you not to.  Get all dental care that you need done  before your procedure. Plan to not have dental work done for 3 months after your surgery.  Follow instructions from your doctor about what you cannot eat or drink.  Ask your doctor how your surgical site will be marked or identified.  You may be given antibiotic medicine to help prevent infection.  If your doctor prescribes physical therapy, do exercises as told.  Do not use any tobacco products, such as cigarettes, chewing tobacco, or e-cigarettes. If you need help quitting, ask your doctor.  You may have a physical exam.  You may have tests, such as:  X-rays.  MRI.  CT scan.  Bone scans.  You may have a blood or urine sample taken.  Plan to have someone take you home after the procedure.  If you will be going home right after the procedure, plan to have someone with you for at least 24 hours. It is best to have someone help care for you for at least 4-6 weeks after surgery. What happens during the procedure?  To reduce your risk of infection:  Your health care team will wash or sanitize their hands.  Your skin will be washed with soap.  An IV tube will be put into one of your veins.  You will be given one or more of the following:  Sedative. This is a medicine that makes you relaxed.  Local anesthetic. This is a medicine to numb  the area.  General anesthetic. This is a medicine that makes you fall asleep.  Spinal anesthetic. This is a medicine that numbs your body below the waist.  Regional anesthetic. This is a medicine that numbs everything below the injection site.  A cut (incision) will be made in your knee.  Damaged parts of your thigh bone, lower leg bone, and kneecap will be removed.  A piece of metal (liner) will be placed on your thigh bone. Pieces of plastic will be placed on your lower leg bone and the underside of your kneecap.  One or more small tubes (drains) may be placed near your cut to help drain fluid.  Your cut will be closed with  stitches (sutures), skin glue, or skin tape (adhesive) strips. Medicine may be put on your cut.  A bandage (dressing) will be placed over your cut. The procedure may vary among doctors and hospitals. What happens after the procedure?  Your blood pressure, heart rate, breathing rate, and blood oxygen level will be monitored often until the medicines you were given have worn off.  You may continue to get fluids and medicines through an IV tube.  You will have some pain. There will be medicines to help you.  You may have fluid coming from a drain.  You may have to wear special socks (compression stockings). These help to prevent blood clots and reduce swelling in your legs.  You will be told to move around as much as possible.  You may be given a continuous passive motion machine to use at home. You will be shown how to use this machine.  Do not drive for 24 hours if you received a sedative. This information is not intended to replace advice given to you by your health care provider. Make sure you discuss any questions you have with your health care provider. Document Released: 11/25/2011 Document Revised: 05/06/2016 Document Reviewed: 08/09/2015 Elsevier Interactive Patient Education  2017 [employer]  Total Knee Replacement, Care After These instructions give you information about caring for yourself after your procedure. Your doctor may also give you more specific instructions. Call your doctor if you have any problems or questions after your procedure. Follow these instructions at home: Medicines  Take over-the-counter and prescription medicines only as told by your doctor.  If you were prescribed an antibiotic medicine, take it as told by your doctor. Do not stop taking the antibiotic even if you start to feel better.  If you were prescribed a blood thinner (anticoagulant), take it as told by your doctor. If you have a splint or brace:  Wear the splint or brace as told by  your doctor. Remove it only as told by your doctor.  Loosen the splint or brace if your toes tingle, get numb, or turn cold and blue.  Do not let your splint or brace get wet if it is not waterproof.  Keep the splint or brace clean. Bathing  Do not take baths, swim, or use a hot tub until your doctor says it is okay. Ask your doctor if you can take showers. You may only be allowed to take sponge baths for bathing.  If you have a splint or brace that is not waterproof, cover it with a watertight covering when you take a bath or a shower.  Keep your bandage (dressing) dry until your doctor says it can be taken off. Incision care and drain care  Check your cut from surgery (incision) and your drain every day  for signs of infection. Check for:  More redness, swelling, or pain.  More fluid or blood.  Warmth.  Pus or a bad smell.  Follow instructions from your doctor about how to take care of your cut from surgery. Make sure you:  Wash your hands with soap and water before you change your bandage. If you cannot use soap and water, use hand sanitizer.  Change your bandage as told by your doctor.  Leave stitches (sutures), skin glue, or skin tape (adhesive) strips in place. They may need to stay in place for 2 weeks or longer. If tape strips get loose and curl up, you may trim the loose edges. Do not remove tape strips completely unless your doctor says it is okay.  If you have a drain, follow instructions from your doctor about caring for it. Do not remove the drain tube or any bandages unless your doctor says it is okay. Managing pain, stiffness, and swelling  If directed, put ice on your knee.  Put ice in a plastic bag.  Place a towel between your skin and the bag.  Leave the ice on for 20 minutes, 2-3 times per day.  If directed, apply heat to the affected area as often as told by your doctor. Use the heat source that your doctor recommends, such as a moist heat pack or a  heating pad.  Place a towel between your skin and the heat source.  Leave the heat on for 20-30 minutes.  Remove the heat if your skin turns bright red. This is especially important if you are unable to feel pain, heat, or cold. You may have a greater risk of getting burned.  Move your toes often to avoid stiffness and to lessen swelling.  Raise (elevate) your knee above the level of your heart while you are sitting or lying down.  Wear elastic knee support for as long as told by your doctor. Driving  Do not drive until your doctor says it is okay. Ask your doctor when it is safe to drive if you have a splint or brace on your knee.  Do not drive or use heavy machinery while taking prescription pain medicine.  Do not drive for 24 hours if you received a sedative. Activity  Do not lift anything that is heavier than 10 lb (4.5 kg) until your doctor says it is okay.  Do not play contact sports until your doctor says it is okay.  Avoid high-impact activities, including running, jumping rope, and jumping jacks.  Avoid sitting for a long time without moving. Get up and move around at least every few hours.  If physical therapy was prescribed, do exercises as told by your doctor.  Return to your normal activities as told by your doctor. Ask your doctor what activities are safe for you. Safety  Do not use your leg to support your body weight until your doctor says that you can. Use crutches or a walker as told by your doctor. General instructions  Do not have any dental work done for at least 3 months after your surgery. When you do have dental work done, tell your dentist about your joint replacement.  Do not use any tobacco products, such as cigarettes, chewing tobacco, or e-cigarettes. If you need help quitting, ask your doctor.  Wear special socks (compression stockings) as told by your doctor.  If you have been sent home with a knee joint motion machine (continuous passive  motion machine), use it as told  by your doctor.  Drink enough fluid to keep your pee (urine) clear or pale yellow.  If you have been told to lose weight, follow instructions from your doctor about how to do this safely.  Keep all follow-up visits as told by your doctor. This is important. Contact a doctor if:  You have more redness, swelling, or pain around your cut from surgery or your drain.  You have more fluid or blood coming from your cut from surgery or your drain.  Your cut from surgery or your drain area feels warm to the touch.  You have pus or a bad smell coming from your cut from surgery or your drain.  You have a fever.  Your cut breaks open after your doctor removes your stitches, skin glue, or skin tape strips.  Your new joint feels loose.  You have knee pain that does not go away. Get help right away if:  You have a rash.  You have pain in your calf or thigh.  You have swelling in your calf or thigh.  You have shortness of breath.  You have trouble breathing.  You have chest pain.  Your ability to move your knee is getting worse. This information is not intended to replace advice given to you by your health care provider. Make sure you discuss any questions you have with your health care provider. Document Released: 11/25/2011 Document Revised: 05/06/2016 Document Reviewed: 08/09/2015 Elsevier Interactive Patient Education  2017 [employer] PATIENT INSTRUCTIONS POST-ANESTHESIA  IMMEDIATELY FOLLOWING SURGERY:  Do not drive or operate machinery for the first twenty four hours after surgery.  Do not make any important decisions for twenty four hours after surgery or while taking narcotic pain medications or sedatives.  If you develop intractable nausea and vomiting or a severe headache please notify your doctor immediately.  FOLLOW-UP:  Please make an appointment with your surgeon as instructed. You do not need to follow up with anesthesia unless  specifically instructed to do so.  WOUND CARE INSTRUCTIONS (if applicable):  Keep a dry clean dressing on the anesthesia/puncture wound site if there is drainage.  Once the wound has quit draining you may leave it open to air.  Generally you should leave the bandage intact for twenty four hours unless there is drainage.  If the epidural site drains for more than 36-48 hours please call the anesthesia department.  QUESTIONS?:  Please feel free to call your physician or the hospital operator if you have any questions, and they will be happy to assist you.

## 2016-08-12 ENCOUNTER — Telehealth: Payer: Self-pay | Admitting: *Deleted

## 2016-08-12 ENCOUNTER — Other Ambulatory Visit: Payer: Self-pay | Admitting: *Deleted

## 2016-08-12 ENCOUNTER — Telehealth: Payer: Self-pay | Admitting: Orthopedic Surgery

## 2016-08-12 ENCOUNTER — Encounter (HOSPITAL_COMMUNITY)
Admission: RE | Admit: 2016-08-12 | Discharge: 2016-08-12 | Disposition: A | Discharge status: Home / Self Care | Payer: Commercial Managed Care - HMO | Source: Ambulatory Visit | Attending: Orthopedic Surgery | Admitting: Orthopedic Surgery

## 2016-08-12 ENCOUNTER — Encounter (HOSPITAL_COMMUNITY): Payer: Self-pay

## 2016-08-12 ENCOUNTER — Other Ambulatory Visit: Payer: Self-pay | Admitting: Orthopedic Surgery

## 2016-08-12 DIAGNOSIS — M1712 Unilateral primary osteoarthritis, left knee: Secondary | ICD-10-CM

## 2016-08-12 DIAGNOSIS — I10 Essential (primary) hypertension: Secondary | ICD-10-CM | POA: Diagnosis present

## 2016-08-12 DIAGNOSIS — Z01818 Encounter for other preprocedural examination: Secondary | ICD-10-CM | POA: Insufficient documentation

## 2016-08-12 DIAGNOSIS — Z01812 Encounter for preprocedural laboratory examination: Secondary | ICD-10-CM | POA: Insufficient documentation

## 2016-08-12 DIAGNOSIS — Z0183 Encounter for blood typing: Secondary | ICD-10-CM | POA: Insufficient documentation

## 2016-08-12 DIAGNOSIS — Z96651 Presence of right artificial knee joint: Secondary | ICD-10-CM | POA: Diagnosis not present

## 2016-08-12 LAB — BASIC METABOLIC PANEL WITH GFR
Anion gap: 10 (ref 5–15)
BUN: 14 mg/dL (ref 6–20)
CO2: 31 mmol/L (ref 22–32)
Calcium: 9.5 mg/dL (ref 8.9–10.3)
Chloride: 98 mmol/L — ABNORMAL LOW (ref 101–111)
Creatinine, Ser: 0.82 mg/dL (ref 0.44–1.00)
GFR calc Af Amer: >60 mL/min
GFR calc non Af Amer: >60 mL/min
Glucose, Bld: 193 mg/dL — ABNORMAL HIGH (ref 65–99)
Potassium: 2.6 mmol/L — CL (ref 3.5–5.1)
Sodium: 139 mmol/L (ref 135–145)

## 2016-08-12 LAB — CBC WITH DIFFERENTIAL/PLATELET
Basophils Absolute: 0 K/uL (ref 0.0–0.1)
Basophils Relative: 0 %
Eosinophils Absolute: 0.1 K/uL (ref 0.0–0.7)
Eosinophils Relative: 3 %
HCT: 42.7 % (ref 36.0–46.0)
Hemoglobin: 14.1 g/dL (ref 12.0–15.0)
Lymphocytes Relative: 48 %
Lymphs Abs: 2.2 K/uL (ref 0.7–4.0)
MCH: 33.1 pg (ref 26.0–34.0)
MCHC: 33 g/dL (ref 30.0–36.0)
MCV: 100.2 fL — ABNORMAL HIGH (ref 78.0–100.0)
Monocytes Absolute: 0.4 K/uL (ref 0.1–1.0)
Monocytes Relative: 8 %
Neutro Abs: 1.9 K/uL (ref 1.7–7.7)
Neutrophils Relative %: 41 %
Platelets: 146 K/uL — ABNORMAL LOW (ref 150–400)
RBC: 4.26 MIL/uL (ref 3.87–5.11)
RDW: 12.6 % (ref 11.5–15.5)
WBC: 4.6 K/uL (ref 4.0–10.5)

## 2016-08-12 LAB — SURGICAL PCR SCREEN
MRSA, PCR: NEGATIVE
STAPHYLOCOCCUS AUREUS: NEGATIVE

## 2016-08-12 LAB — PROTIME-INR
INR: 0.99
Prothrombin Time: 13.1 seconds (ref 11.4–15.2)

## 2016-08-12 LAB — ABO/RH: ABO/RH(D): O NEG

## 2016-08-12 LAB — APTT: aPTT: 30 s (ref 24–36)

## 2016-08-12 LAB — PREPARE RBC (CROSSMATCH)

## 2016-08-12 MED ORDER — POTASSIUM CHLORIDE ER 20 MEQ PO TBCR: 2.0000 | EXTENDED_RELEASE_TABLET | Freq: Three times a day (TID) | ORAL | 0 refills | Status: DC

## 2016-08-12 MED ORDER — HYDROCODONE-ACETAMINOPHEN 7.5-325 MG PO TABS: 1.0000 | ORAL_TABLET | Freq: Four times a day (QID) | ORAL | 0 refills | Status: DC | PRN

## 2016-08-12 NOTE — Telephone Encounter (Signed)
Call from short stay regarding pre op results  Potassium 2.6, takes chlorthalidone 25mg  daily  Blood sugar 193, not a known diabetic

## 2016-08-12 NOTE — Progress Notes (Signed)
Low potassium   Ordered to take nurse to call

## 2016-08-12 NOTE — Telephone Encounter (Signed)
Hydrocodone-Acetaminophen  7.5/325mg   Qty 120 tablets  Take 1 tablet by mouth every 6 (six) hours as needed for moderate pain (Must last 30 days. Do not drive or operate machinery while taking this medicine.).

## 2016-08-13 NOTE — Pre-Procedure Instructions (Signed)
Dr Jayme CloudGonzalez aware of potassium and glucose and that Dr Romeo AppleHarrison is to call and instruct patient on what to do. Will do Istat on arrival am of surgery per Dr Jayme CloudGOnzalez order.

## 2016-08-14 ENCOUNTER — Encounter: Payer: Self-pay | Admitting: *Deleted

## 2016-08-14 MED ORDER — TRANEXAMIC ACID 1000 MG/10ML IV SOLN
1000.0000 mg | INTRAVENOUS | Status: AC
  Administered 2016-08-15: 1000 mg via INTRAVENOUS
  Filled 2016-08-14: qty 10

## 2016-08-14 NOTE — H&P (Addendum)
TOTAL KNEE ADMISSION H&P  Patient is being admitted for left total knee arthroplasty.  Subjective:  Chief Complaint:left knee pain.  HPI: Norma Barton, 81 y.o. female, has a history of pain and functional disability in the left knee due to arthritis and has failed non-surgical conservative treatments for greater than 12 weeks to includeNSAID's and/or analgesics, corticosteriod injections, use of assistive devices, weight reduction as appropriate and activity modification.  Onset of symptoms was gradual, starting 1-2 yrs ago years ago with gradually worsening course since that time. The patient noted no past surgery on the left knee(s).  Patient currently rates pain in the left knee(s) at 7 out of 10 with activity. Patient has night pain, worsening of pain with activity and weight bearing, pain that interferes with activities of daily living, pain with passive range of motion, crepitus and joint swelling.  Patient has evidence of subchondral sclerosis, periarticular osteophytes, joint subluxation, joint space narrowing and varus  by imaging studies. There is no active infection.  Patient Active Problem List   Diagnosis Date Noted  . Left knee pain 11/07/2015  . Right knee pain 11/07/2015  . Hypertension 04/12/2013  . Osteoarthritis of both knees 04/12/2013   Past Medical History:  Diagnosis Date  . Hypertension     Past Surgical History:  Procedure Laterality Date  . ABDOMINAL HYSTERECTOMY    . arthroscopy right knee      No prescriptions prior to admission.   No Known Allergies  Social History  Substance Use Topics  . Smoking status: Never Smoker  . Smokeless tobacco: Never Used  . Alcohol use No    No family history on file.   Review of Systems  Musculoskeletal: Positive for joint pain.  All other systems reviewed and are negative.   Objective:  Physical Exam  Constitutional: She is oriented to person, place, and time. She appears well-nourished.  Eyes: Right eye  exhibits no discharge. Left eye exhibits no discharge. No scleral icterus.  Neck: Neck supple. No JVD present. No tracheal deviation present.  Cardiovascular: Intact distal pulses.   Respiratory: Effort normal. No stridor.  GI: Soft. She exhibits no distension.  Musculoskeletal:  The patient's upper extremities right and left reveal no pathological malalignment contracture subluxation atrophy tremor or skin lesion pulse deficit lymph node enlargement or sensation abnormality  Gait abnormality observed.  The right lower extremity has no swelling, no contracture, no instability, normal muscle tone, no skin lesions, normal perfusion normal sensation  Left knee is in varus alignment, medial tenderness is noted slight effusion. Flexion arc is about 110 up to 115 of flexion mild flexion contracture knee stable strength normal skin intact no rash no lesion or ulcer no erythema pulses normal lymph nodes negative sensation normal    Neurological: She is alert and oriented to person, place, and time. She has normal reflexes. She exhibits normal muscle tone. Coordination normal.  Skin: Skin is warm and dry. No rash noted. No erythema. No pallor.  Psychiatric: She has a normal mood and affect. Her behavior is normal. Thought content normal.    Vital signs in last 24 hours:    Labs:   Estimated body mass index is 28.67 kg/m as calculated from the following:   Height as of 08/12/16: 5' 2.25" (1.581 m).   Weight as of 08/12/16: 158 lb (71.7 kg).   Imaging Review Plain films show severe degenerative disease left knee medial compartment with varus alignment which is mild to moderate. Bone quality normal.  Assessment/Plan:  End stage arthritis, left knee   Left total knee arthroplasty  The patient history, physical examination, clinical judgment of the provider and imaging studies are consistent with end stage degenerative joint disease of the left knee(s) and total knee arthroplasty is  deemed medically necessary. The treatment options including medical management, injection therapy arthroscopy and arthroplasty were discussed at length. The risks and benefits of total knee arthroplasty were presented and reviewed. The risks due to aseptic loosening, infection, stiffness, patella tracking problems, thromboembolic complications and other imponderables were discussed. The patient acknowledged the explanation, agreed to proceed with the plan and consent was signed. Patient is being admitted for inpatient treatment for surgery, pain control, PT, OT, prophylactic antibiotics, VTE prophylaxis, progressive ambulation and ADL's and discharge planning. The patient is planning to be discharged to inpatient rehab

## 2016-08-14 NOTE — Progress Notes (Signed)
Surgery 08/15/16 cpt [id number] approved Berkley Harveyauth [id number]

## 2016-08-15 ENCOUNTER — Inpatient Hospital Stay (HOSPITAL_COMMUNITY): Payer: Commercial Managed Care - HMO | Admitting: Anesthesiology

## 2016-08-15 ENCOUNTER — Encounter (HOSPITAL_COMMUNITY): Payer: Self-pay | Admitting: *Deleted

## 2016-08-15 ENCOUNTER — Inpatient Hospital Stay (HOSPITAL_COMMUNITY)
Admission: RE | Admit: 2016-08-15 | Discharge: 2016-08-17 | DRG: 470 | Disposition: A | Discharge status: Home Health | Payer: Commercial Managed Care - HMO | Source: Ambulatory Visit | Attending: Orthopedic Surgery | Admitting: Orthopedic Surgery

## 2016-08-15 ENCOUNTER — Encounter (HOSPITAL_COMMUNITY)
Admission: RE | Disposition: A | Discharge status: Home Health | Payer: Self-pay | Source: Ambulatory Visit | Attending: Orthopedic Surgery

## 2016-08-15 DIAGNOSIS — M1712 Unilateral primary osteoarthritis, left knee: Principal | ICD-10-CM | POA: Diagnosis present

## 2016-08-15 DIAGNOSIS — R2689 Other abnormalities of gait and mobility: Secondary | ICD-10-CM

## 2016-08-15 DIAGNOSIS — I10 Essential (primary) hypertension: Secondary | ICD-10-CM | POA: Diagnosis present

## 2016-08-15 DIAGNOSIS — M6281 Muscle weakness (generalized): Secondary | ICD-10-CM

## 2016-08-15 HISTORY — PX: TOTAL KNEE ARTHROPLASTY: SHX125

## 2016-08-15 LAB — POCT I-STAT 4, (NA,K, GLUC, HGB,HCT)
GLUCOSE: 126 mg/dL — AB (ref 65–99)
HEMATOCRIT: 44 % (ref 36.0–46.0)
Hemoglobin: 15 g/dL (ref 12.0–15.0)
POTASSIUM: 3.9 mmol/L (ref 3.5–5.1)
SODIUM: 143 mmol/L (ref 135–145)

## 2016-08-15 SURGERY — ARTHROPLASTY, KNEE, TOTAL
Anesthesia: Spinal | Site: Knee | Laterality: Left

## 2016-08-15 MED ORDER — BUPIVACAINE LIPOSOME 1.3 % IJ SUSP
20.0000 mL | Freq: Once | INTRAMUSCULAR | Status: DC
  Filled 2016-08-15: qty 20

## 2016-08-15 MED ORDER — ASPIRIN EC 325 MG PO TBEC
325.0000 mg | DELAYED_RELEASE_TABLET | Freq: Every day | ORAL | Status: DC
  Administered 2016-08-16 – 2016-08-17 (×2): 325 mg via ORAL
  Filled 2016-08-15 (×2): qty 1

## 2016-08-15 MED ORDER — DOCUSATE SODIUM 100 MG PO CAPS
100.0000 mg | ORAL_CAPSULE | Freq: Two times a day (BID) | ORAL | Status: DC
  Administered 2016-08-15 – 2016-08-17 (×4): 100 mg via ORAL
  Filled 2016-08-15 (×4): qty 1

## 2016-08-15 MED ORDER — PHENYLEPHRINE 40 MCG/ML (10ML) SYRINGE FOR IV PUSH (FOR BLOOD PRESSURE SUPPORT)
PREFILLED_SYRINGE | INTRAVENOUS | Status: AC
  Filled 2016-08-15: qty 10

## 2016-08-15 MED ORDER — MENTHOL 3 MG MT LOZG: 1.0000 | LOZENGE | OROMUCOSAL | Status: DC | PRN

## 2016-08-15 MED ORDER — MAGNESIUM CITRATE PO SOLN: 1.0000 | Freq: Once | ORAL | Status: DC | PRN

## 2016-08-15 MED ORDER — PROPOFOL 10 MG/ML IV BOLUS
INTRAVENOUS | Status: AC
  Filled 2016-08-15: qty 20

## 2016-08-15 MED ORDER — PHENYLEPHRINE HCL 10 MG/ML IJ SOLN
INTRAMUSCULAR | Status: DC | PRN
  Administered 2016-08-15 (×4): 40 ug via INTRAVENOUS

## 2016-08-15 MED ORDER — ALUM & MAG HYDROXIDE-SIMETH 200-200-20 MG/5ML PO SUSP: 30.0000 mL | ORAL | Status: DC | PRN

## 2016-08-15 MED ORDER — HYDROMORPHONE HCL 1 MG/ML IJ SOLN
0.5000 mg | INTRAMUSCULAR | Status: DC | PRN
  Administered 2016-08-15: 0.5 mg via INTRAVENOUS
  Filled 2016-08-15: qty 1

## 2016-08-15 MED ORDER — FENTANYL CITRATE (PF) 100 MCG/2ML IJ SOLN
INTRAMUSCULAR | Status: AC
  Filled 2016-08-15: qty 2

## 2016-08-15 MED ORDER — BUPIVACAINE LIPOSOME 1.3 % IJ SUSP
INTRAMUSCULAR | Status: AC
  Filled 2016-08-15: qty 20

## 2016-08-15 MED ORDER — SODIUM CHLORIDE 0.9 % IV SOLN
INTRAVENOUS | Status: DC | PRN
  Administered 2016-08-15: 60 mL

## 2016-08-15 MED ORDER — KETOROLAC TROMETHAMINE 15 MG/ML IJ SOLN
7.5000 mg | Freq: Four times a day (QID) | INTRAMUSCULAR | Status: AC
  Administered 2016-08-16 (×4): 7.5 mg via INTRAVENOUS
  Filled 2016-08-15 (×4): qty 1

## 2016-08-15 MED ORDER — BUPIVACAINE IN DEXTROSE 0.75-8.25 % IT SOLN
INTRATHECAL | Status: DC | PRN
  Administered 2016-08-15: 15 mg via INTRATHECAL

## 2016-08-15 MED ORDER — MIDAZOLAM HCL 2 MG/2ML IJ SOLN
INTRAMUSCULAR | Status: AC
  Filled 2016-08-15: qty 2

## 2016-08-15 MED ORDER — PROPOFOL 500 MG/50ML IV EMUL
INTRAVENOUS | Status: DC | PRN
  Administered 2016-08-15: 35 ug/kg/min via INTRAVENOUS
  Administered 2016-08-15: 09:00:00 via INTRAVENOUS

## 2016-08-15 MED ORDER — CELECOXIB 400 MG PO CAPS
400.0000 mg | ORAL_CAPSULE | Freq: Once | ORAL | Status: AC
  Administered 2016-08-15: 400 mg via ORAL
  Filled 2016-08-15: qty 1

## 2016-08-15 MED ORDER — METOCLOPRAMIDE HCL 5 MG/ML IJ SOLN: 5.0000 mg | Freq: Three times a day (TID) | INTRAMUSCULAR | Status: DC | PRN

## 2016-08-15 MED ORDER — METHOCARBAMOL 500 MG PO TABS
500.0000 mg | ORAL_TABLET | Freq: Four times a day (QID) | ORAL | Status: DC | PRN
  Administered 2016-08-16: 500 mg via ORAL
  Filled 2016-08-15: qty 1

## 2016-08-15 MED ORDER — LACTATED RINGERS IV SOLN
INTRAVENOUS | Status: DC
  Administered 2016-08-15: 1000 mL via INTRAVENOUS
  Administered 2016-08-15: 09:00:00 via INTRAVENOUS

## 2016-08-15 MED ORDER — MIDAZOLAM HCL 5 MG/5ML IJ SOLN
INTRAMUSCULAR | Status: DC | PRN
  Administered 2016-08-15: 2 mg via INTRAVENOUS

## 2016-08-15 MED ORDER — METOPROLOL SUCCINATE ER 25 MG PO TB24
25.0000 mg | ORAL_TABLET | Freq: Every day | ORAL | Status: DC
  Administered 2016-08-16 – 2016-08-17 (×2): 25 mg via ORAL
  Filled 2016-08-15 (×2): qty 1

## 2016-08-15 MED ORDER — MIDAZOLAM HCL 2 MG/2ML IJ SOLN
1.0000 mg | INTRAMUSCULAR | Status: DC | PRN
  Administered 2016-08-15: 2 mg via INTRAVENOUS

## 2016-08-15 MED ORDER — CHLORHEXIDINE GLUCONATE 4 % EX LIQD: 60.0000 mL | Freq: Once | CUTANEOUS | Status: DC

## 2016-08-15 MED ORDER — FENTANYL CITRATE (PF) 100 MCG/2ML IJ SOLN
INTRAMUSCULAR | Status: DC | PRN
  Administered 2016-08-15: 25 ug via INTRATHECAL

## 2016-08-15 MED ORDER — METHOCARBAMOL 1000 MG/10ML IJ SOLN
500.0000 mg | Freq: Four times a day (QID) | INTRAVENOUS | Status: DC | PRN
  Filled 2016-08-15: qty 5

## 2016-08-15 MED ORDER — EPHEDRINE SULFATE 50 MG/ML IJ SOLN
INTRAMUSCULAR | Status: AC
  Filled 2016-08-15: qty 1

## 2016-08-15 MED ORDER — PHENOL 1.4 % MT LIQD: 1.0000 | OROMUCOSAL | Status: DC | PRN

## 2016-08-15 MED ORDER — DEXAMETHASONE SODIUM PHOSPHATE 10 MG/ML IJ SOLN
10.0000 mg | Freq: Once | INTRAMUSCULAR | Status: AC
  Administered 2016-08-16: 10 mg via INTRAVENOUS
  Filled 2016-08-15: qty 1

## 2016-08-15 MED ORDER — SODIUM CHLORIDE 0.9 % IJ SOLN
INTRAMUSCULAR | Status: AC
  Filled 2016-08-15: qty 10

## 2016-08-15 MED ORDER — PREGABALIN 50 MG PO CAPS
50.0000 mg | ORAL_CAPSULE | Freq: Once | ORAL | Status: AC
  Administered 2016-08-15: 50 mg via ORAL
  Filled 2016-08-15: qty 1

## 2016-08-15 MED ORDER — DIPHENHYDRAMINE HCL 12.5 MG/5ML PO ELIX
12.5000 mg | ORAL_SOLUTION | ORAL | Status: DC | PRN
Start: 2016-08-15 — End: 2016-08-17

## 2016-08-15 MED ORDER — OXYCODONE HCL 5 MG PO TABS
5.0000 mg | ORAL_TABLET | Freq: Once | ORAL | Status: AC
  Administered 2016-08-15: 5 mg via ORAL
  Filled 2016-08-15: qty 1

## 2016-08-15 MED ORDER — ENSURE ENLIVE PO LIQD
237.0000 mL | Freq: Two times a day (BID) | ORAL | Status: DC
  Administered 2016-08-16: 237 mL via ORAL

## 2016-08-15 MED ORDER — CEFAZOLIN SODIUM-DEXTROSE 2-4 GM/100ML-% IV SOLN
2.0000 g | INTRAVENOUS | Status: AC
  Administered 2016-08-15: 2 g via INTRAVENOUS
  Filled 2016-08-15: qty 100

## 2016-08-15 MED ORDER — FENTANYL CITRATE (PF) 100 MCG/2ML IJ SOLN: 25.0000 ug | INTRAMUSCULAR | Status: DC | PRN

## 2016-08-15 MED ORDER — ACETAMINOPHEN 650 MG RE SUPP: 650.0000 mg | Freq: Four times a day (QID) | RECTAL | Status: DC | PRN

## 2016-08-15 MED ORDER — ONDANSETRON HCL 4 MG/2ML IJ SOLN
4.0000 mg | Freq: Once | INTRAMUSCULAR | Status: AC
  Administered 2016-08-15: 4 mg via INTRAVENOUS
  Filled 2016-08-15: qty 2

## 2016-08-15 MED ORDER — POTASSIUM CHLORIDE CRYS ER 20 MEQ PO TBCR
20.0000 meq | EXTENDED_RELEASE_TABLET | Freq: Every day | ORAL | Status: DC
Start: 2016-08-16 — End: 2016-08-17
  Administered 2016-08-16 – 2016-08-17 (×2): 20 meq via ORAL
  Filled 2016-08-15: qty 2
  Filled 2016-08-15: qty 1

## 2016-08-15 MED ORDER — 0.9 % SODIUM CHLORIDE (POUR BTL) OPTIME
TOPICAL | Status: DC | PRN
Start: 2016-08-15 — End: 2016-08-15
  Administered 2016-08-15: 1000 mL

## 2016-08-15 MED ORDER — CHLORTHALIDONE 25 MG PO TABS
25.0000 mg | ORAL_TABLET | Freq: Every day | ORAL | Status: DC
  Administered 2016-08-16 – 2016-08-17 (×2): 25 mg via ORAL
  Filled 2016-08-15 (×5): qty 1

## 2016-08-15 MED ORDER — CEFAZOLIN IN D5W 1 GM/50ML IV SOLN
1.0000 g | Freq: Four times a day (QID) | INTRAVENOUS | Status: AC
Start: 2016-08-15 — End: 2016-08-15
  Administered 2016-08-15 (×2): 1 g via INTRAVENOUS
  Filled 2016-08-15 (×2): qty 50

## 2016-08-15 MED ORDER — EPHEDRINE SULFATE 50 MG/ML IJ SOLN
INTRAMUSCULAR | Status: DC | PRN
  Administered 2016-08-15: 15 mg via INTRAVENOUS
  Administered 2016-08-15: 10 mg via INTRAVENOUS

## 2016-08-15 MED ORDER — SODIUM CHLORIDE 0.9 % IJ SOLN
INTRAMUSCULAR | Status: AC
  Filled 2016-08-15: qty 40

## 2016-08-15 MED ORDER — SORBITOL 70 % SOLN: 30.0000 mL | Freq: Every day | Status: DC | PRN

## 2016-08-15 MED ORDER — METOCLOPRAMIDE HCL 10 MG PO TABS: 5.0000 mg | ORAL_TABLET | Freq: Three times a day (TID) | ORAL | Status: DC | PRN

## 2016-08-15 MED ORDER — BUPIVACAINE IN DEXTROSE 0.75-8.25 % IT SOLN
INTRATHECAL | Status: AC
  Filled 2016-08-15: qty 2

## 2016-08-15 MED ORDER — SODIUM CHLORIDE 0.9 % IV SOLN
INTRAVENOUS | Status: DC
  Administered 2016-08-15 – 2016-08-16 (×2): via INTRAVENOUS

## 2016-08-15 MED ORDER — FAMOTIDINE 20 MG PO TABS
10.0000 mg | ORAL_TABLET | Freq: Every day | ORAL | Status: DC
  Administered 2016-08-15 – 2016-08-17 (×3): 10 mg via ORAL
  Filled 2016-08-15 (×3): qty 1

## 2016-08-15 MED ORDER — POLYETHYLENE GLYCOL 3350 17 G PO PACK: 17.0000 g | PACK | Freq: Every day | ORAL | Status: DC | PRN

## 2016-08-15 MED ORDER — HYDROCODONE-ACETAMINOPHEN 10-325 MG PO TABS
1.0000 | ORAL_TABLET | ORAL | Status: DC | PRN
  Administered 2016-08-15 (×2): 1 via ORAL
  Filled 2016-08-15 (×2): qty 1

## 2016-08-15 MED ORDER — METHOCARBAMOL 1000 MG/10ML IJ SOLN
500.0000 mg | Freq: Once | INTRAVENOUS | Status: AC
  Administered 2016-08-15: 500 mg via INTRAVENOUS
  Filled 2016-08-15: qty 5

## 2016-08-15 MED ORDER — SODIUM CHLORIDE 0.9 % IR SOLN
Status: DC | PRN
  Administered 2016-08-15: 3000 mL

## 2016-08-15 MED ORDER — ACETAMINOPHEN 325 MG PO TABS: 650.0000 mg | ORAL_TABLET | Freq: Four times a day (QID) | ORAL | Status: DC | PRN

## 2016-08-15 MED ORDER — BUPIVACAINE-EPINEPHRINE (PF) 0.25% -1:200000 IJ SOLN
INTRAMUSCULAR | Status: DC | PRN
  Administered 2016-08-15: 30 mL

## 2016-08-15 MED ORDER — ONDANSETRON HCL 4 MG PO TABS: 4.0000 mg | ORAL_TABLET | Freq: Four times a day (QID) | ORAL | Status: DC | PRN

## 2016-08-15 MED ORDER — NAPHAZOLINE-GLYCERIN 0.012-0.2 % OP SOLN
1.0000 [drp] | Freq: Four times a day (QID) | OPHTHALMIC | Status: DC | PRN
  Filled 2016-08-15: qty 15

## 2016-08-15 MED ORDER — ONDANSETRON HCL 4 MG/2ML IJ SOLN: 4.0000 mg | Freq: Four times a day (QID) | INTRAMUSCULAR | Status: DC | PRN

## 2016-08-15 SURGICAL SUPPLY — 69 items
BAG HAMPER (MISCELLANEOUS) ×3 IMPLANT
BANDAGE ESMARK 6X9 LF (GAUZE/BANDAGES/DRESSINGS) ×1 IMPLANT
BIT DRILL 3.2X128 (BIT) IMPLANT
BIT DRILL 3.2X128MM (BIT)
BLADE HEX COATED 2.75 (ELECTRODE) ×3 IMPLANT
BLADE SAGITTAL 25.0X1.27X90 (BLADE) ×2 IMPLANT
BLADE SAGITTAL 25.0X1.27X90MM (BLADE) ×1
BNDG ESMARK 6X9 LF (GAUZE/BANDAGES/DRESSINGS) ×3
BOWL SMART MIX CTS (DISPOSABLE) IMPLANT
CAP KNEE TOTAL 3 SIGMA ×3 IMPLANT
CEMENT HV SMART SET (Cement) ×6 IMPLANT
CLOTH BEACON ORANGE TIMEOUT ST (SAFETY) ×3 IMPLANT
COOLER CRYO CUFF IC AND MOTOR (MISCELLANEOUS) ×3 IMPLANT
COVER LIGHT HANDLE STERIS (MISCELLANEOUS) ×6 IMPLANT
CUFF CRYO KNEE LG 20X31 COOLER (ORTHOPEDIC SUPPLIES) IMPLANT
CUFF CRYO KNEE18X23 MED (MISCELLANEOUS) ×3 IMPLANT
CUFF TOURNIQUET SINGLE 34IN LL (TOURNIQUET CUFF) ×3 IMPLANT
DECANTER SPIKE VIAL GLASS SM (MISCELLANEOUS) ×3 IMPLANT
DRAPE BACK TABLE (DRAPES) ×3 IMPLANT
DRAPE EXTREMITY T 121X128X90 (DRAPE) ×3 IMPLANT
DRESSING AQUACEL AG ADV 3.5X12 (MISCELLANEOUS) ×1 IMPLANT
DRSG AQUACEL AG ADV 3.5X12 (MISCELLANEOUS) ×3
DRSG MEPILEX BORDER 4X12 (GAUZE/BANDAGES/DRESSINGS) ×3 IMPLANT
DURAPREP 26ML APPLICATOR (WOUND CARE) ×6 IMPLANT
ELECT REM PT RETURN 9FT ADLT (ELECTROSURGICAL) ×3
ELECTRODE REM PT RTRN 9FT ADLT (ELECTROSURGICAL) ×1 IMPLANT
EVACUATOR 3/16  PVC DRAIN (DRAIN) ×2
EVACUATOR 3/16 PVC DRAIN (DRAIN) ×1 IMPLANT
GLOVE BIO SURGEON STRL SZ7 (GLOVE) ×6 IMPLANT
GLOVE BIOGEL PI IND STRL 7.0 (GLOVE) ×3 IMPLANT
GLOVE BIOGEL PI INDICATOR 7.0 (GLOVE) ×6
GLOVE SKINSENSE NS SZ8.0 LF (GLOVE) ×4
GLOVE SKINSENSE STRL SZ8.0 LF (GLOVE) ×2 IMPLANT
GLOVE SS N UNI LF 8.5 STRL (GLOVE) ×3 IMPLANT
GOWN STRL REUS W/ TWL LRG LVL3 (GOWN DISPOSABLE) ×1 IMPLANT
GOWN STRL REUS W/TWL LRG LVL3 (GOWN DISPOSABLE) ×11 IMPLANT
GOWN STRL REUS W/TWL XL LVL3 (GOWN DISPOSABLE) ×3 IMPLANT
HANDPIECE INTERPULSE COAX TIP (DISPOSABLE) ×2
HOOD W/PEELAWAY (MISCELLANEOUS) ×15 IMPLANT
INST SET MAJOR BONE (KITS) ×3 IMPLANT
IV NS IRRIG 3000ML ARTHROMATIC (IV SOLUTION) ×3 IMPLANT
KIT BLADEGUARD II DBL (SET/KITS/TRAYS/PACK) ×3 IMPLANT
KIT ROOM TURNOVER APOR (KITS) ×3 IMPLANT
MANIFOLD NEPTUNE II (INSTRUMENTS) ×3 IMPLANT
MARKER SKIN DUAL TIP RULER LAB (MISCELLANEOUS) ×3 IMPLANT
NEEDLE HYPO 21X1.5 SAFETY (NEEDLE) ×3 IMPLANT
NS IRRIG 1000ML POUR BTL (IV SOLUTION) ×3 IMPLANT
PACK TOTAL JOINT (CUSTOM PROCEDURE TRAY) ×3 IMPLANT
PAD ARMBOARD 7.5X6 YLW CONV (MISCELLANEOUS) ×3 IMPLANT
PAD DANNIFLEX CPM (ORTHOPEDIC SUPPLIES) ×3 IMPLANT
PIN TROCAR 3 INCH (PIN) IMPLANT
SAW OSC TIP CART 19.5X105X1.3 (SAW) ×3 IMPLANT
SET BASIN LINEN APH (SET/KITS/TRAYS/PACK) ×3 IMPLANT
SET HNDPC FAN SPRY TIP SCT (DISPOSABLE) ×1 IMPLANT
STAPLER VISISTAT 35W (STAPLE) ×3 IMPLANT
SUT BRALON NAB BRD #1 30IN (SUTURE) ×6 IMPLANT
SUT MNCRL 0 VIOLET CTX 36 (SUTURE) ×1 IMPLANT
SUT MON AB 0 CT1 (SUTURE) ×3 IMPLANT
SUT MON AB 2-0 CT1 36 (SUTURE) IMPLANT
SUT MONOCRYL 0 CTX 36 (SUTURE) ×2
SYR 20CC LL (SYRINGE) ×6 IMPLANT
SYR 30ML LL (SYRINGE) ×3 IMPLANT
SYR BULB IRRIGATION 50ML (SYRINGE) ×3 IMPLANT
TAPE CLOTH SURG 4X10 WHT LF (GAUZE/BANDAGES/DRESSINGS) ×3 IMPLANT
TOWEL OR 17X26 4PK STRL BLUE (TOWEL DISPOSABLE) ×3 IMPLANT
TOWER CARTRIDGE SMART MIX (DISPOSABLE) ×3 IMPLANT
TRAY FOLEY W/METER SILVER 16FR (SET/KITS/TRAYS/PACK) ×3 IMPLANT
WATER STERILE IRR 1000ML POUR (IV SOLUTION) ×6 IMPLANT
YANKAUER SUCT 12FT TUBE ARGYLE (SUCTIONS) ×3 IMPLANT

## 2016-08-15 NOTE — Progress Notes (Addendum)
Istat done to recheck K+ level, results in K 3.9  2 units PRBC verified with blood bank, Sherry.

## 2016-08-15 NOTE — Anesthesia Postprocedure Evaluation (Signed)
Anesthesia Post Note  Patient: Norma SearingBetty M Barton  Procedure(s) Performed: Procedure(s) (LRB): TOTAL KNEE ARTHROPLASTY (Left)  Patient location during evaluation: PACU Anesthesia Type: Spinal Level of consciousness: awake and alert, oriented and patient cooperative Pain management: pain level controlled Vital Signs Assessment: post-procedure vital signs reviewed and stable Respiratory status: spontaneous breathing, nonlabored ventilation and respiratory function stable Cardiovascular status: blood pressure returned to baseline and stable Postop Assessment: no signs of nausea or vomiting Anesthetic complications: no    Last Vitals:  Vitals:   08/15/16 0715 08/15/16 0720  BP: 125/67 128/74  Pulse:    Resp: 13 (!) 22  Temp:      Last Pain:  Vitals:   08/15/16 0656  TempSrc: Oral  PainSc: 3                  Samara Stankowski J

## 2016-08-15 NOTE — Anesthesia Preprocedure Evaluation (Signed)
Anesthesia Evaluation  Patient identified by MRN, date of birth, ID band Patient awake    Reviewed: Allergy & Precautions, NPO status , Patient's Chart, lab work & pertinent test results, reviewed documented beta blocker date and time   Airway Mallampati: II  TM Distance: >3 FB Neck ROM: Full    Dental  (+) Edentulous Upper, Edentulous Lower   Pulmonary neg pulmonary ROS,    breath sounds clear to auscultation       Cardiovascular hypertension, Pt. on medications and Pt. on home beta blockers  Rhythm:Regular Rate:Normal     Neuro/Psych    GI/Hepatic negative GI ROS, GERD  Medicated and Controlled,  Endo/Other    Renal/GU      Musculoskeletal  (+) Arthritis ,   Abdominal   Peds  Hematology   Anesthesia Other Findings   Reproductive/Obstetrics                            Anesthesia Physical Anesthesia Plan  ASA: II  Anesthesia Plan: Spinal   Post-op Pain Management:    Induction:   Airway Management Planned: Simple Face Mask  Additional Equipment:   Intra-op Plan:   Post-operative Plan:   Informed Consent: I have reviewed the patients History and Physical, chart, labs and discussed the procedure including the risks, benefits and alternatives for the proposed anesthesia with the patient or authorized representative who has indicated his/her understanding and acceptance.     Plan Discussed with:   Anesthesia Plan Comments:         Anesthesia Quick Evaluation

## 2016-08-15 NOTE — Brief Op Note (Addendum)
08/15/2016  9:35 AM  PATIENT:  Norma Barton  81 y.o. female  PRE-OPERATIVE DIAGNOSIS:  LEFT KNEE OSTEOARTHRITIS  POST-OPERATIVE DIAGNOSIS:  LEFT KNEE OSTEOARTHRITIS  PROCEDURE:  Procedure(s): TOTAL KNEE ARTHROPLASTY (Left)  SURGEON:  Surgeon(s) and Role:    * Vickki HearingStanley E Ilian Wessell, MD - Primary  PHYSICIAN ASSISTANT:   ASSISTANTS: Mariaeduarda ashley and debbie dallas   ANESTHESIA:   spinal  EBL:  Total I/O In: 1700 [I.V.:1700] Out: 25 [Blood:25]  BLOOD ADMINISTERED:none  DRAINS: hemovac   LOCAL MEDICATIONS USED:  MARCAINE    and Amount: 30 ml + exparel 20/40  SPECIMEN:  No Specimen  DISPOSITION OF SPECIMEN:  N/A  COUNTS:  YES  TOURNIQUET:   Total Tourniquet Time Documented: Thigh (Left) - 75 minutes Total: Thigh (Left) - 75 minutes   DICTATION: .Reubin Milanragon Dictation  PLAN OF CARE: Admit to inpatient   PATIENT DISPOSITION:  PACU - hemodynamically stable.   Delay start of Pharmacological VTE agent (>24hrs) due to surgical blood loss or risk of bleeding: yes   Surgical dictation for left total knee  Preop diagnosis osteoarthritis left knee  Postop diagnosis osteoarthritis left knee  Surgeon Dr. Romeo AppleHarrison [phone]  Anesthetic spinal  Implants DEPUY  SIGMA PS FB   SIZES:    F 2   T 2.5   P 35  Poly 10  Drains: one Hemovac drain in the joint   Exparel 20MG  DILUTED W/ 40 CC SALINE   Marcaine with epinephrine    Operative findings : mod varus, mild (5) flex contracture, medial compartment grad4 degen with med osteophytes Patella grade 2     details of procedure:   The patient was identified in the preop holding area and the surgical site was confirmed as the left knee. Chart review and update were completed. The patient was taken to the operating room for spinal anesthesia. After successful spinal anesthesia Foley catheter was inserted. The patient was placed supine on the operating table.   the left leg was prepped with DuraPrep and draped sterilely.  Timeout was completed. The limb was then exsanguinated a  6 inch Esmarch. The tourniquet was elevated to 300 mmHg.   A midline incision was made and taken down to the extensor mechanism followed by medial arthrotomy. The patella was everted. A synovectomy was performed as needed. The osteophytes were resected.  Anterior cruciate ligament and PCL and medial and lateral meniscus were resected.   a 3/8 inch drill bit was used to enter the femoral canal which was suctioned and irrigated until the fluid was clear. The distal femoral cut was set for 11 millimeter resection with a 5   Left Valgus angle. This cut was completed and checked for flatness.   the femur was then measured to a size 2.  The cutting block was placed to match the epicondyles and the 4 distal cuts were made.   the tibia was subluxated forward and the external alignment guide was placed. We removed 10 mm of bone from the higher  lateral side. We set the guide for neutral varus valgus cut related to the  Mechanical axis of the tibia and for slope matching the patient's anatomy. Rotational alignment was set using the tibial tubercle, tibial spine and second metatarsal. The cutting block was pinned and the proximal tibia was resected.    spacer blocks were placed starting with a 10 mm insert to confirm equal flexion-extension gaps. A size  10  mm insert balanced the gaps.   We placed  the femoral notch cutting guide size 2  and resected the notch.   Trial implants were placed using appropriate size femur , appropriate size tibial baseplate which was measured after the proximal tibia resection. Tibial rotation was set patella tracking was normal   The tibia was then punched per manufacture technique making sure to avoid internal rotation.   The patella measured a size 22   We resected down to a size 12 using a size 35 x 9 button.   Final range of motion check was performed with the appropriate size trials as mentioned above.  Satisfactory reduction and motion were obtained.   Trial implants were removed. The bone was irrigated and dried and the cement was mixed on the back table  exparel was injected in the soft tissues and posterior capsule of the knee  These implants were then cemented in place. Excess cement was removed. The cement was allowed to cure. Second irrigation was performed.    FInal range of motion check and stability check was completed  The wound was irrigated third time Hemovac drain was placed, extensor mechanism was closed with #1 Nurolon followed by 0 Monocryl and staples to reapproximate the skin edges and subcutaneous tissue.   Sterile dressing was applied  The patient was taken recovery in stable condition   [phone]

## 2016-08-15 NOTE — Interval H&P Note (Signed)
History and Physical Interval Note:  08/15/2016 7:19 AM  BP 125/67   Pulse 75   Temp 98.5 F (36.9 C) (Oral)   Resp 13   Ht 5' 2.5" (1.588 m)   Wt 158 lb (71.7 kg)   SpO2 100%   BMI 28.44 kg/m   Skin clean for surgery   Norma Barton  has presented today for surgery, with the diagnosis of LEFT KNEE OSTEOARTHRITIS  The various methods of treatment have been discussed with the patient and family. After consideration of risks, benefits and other options for treatment, the patient has consented to  Procedure(s): TOTAL KNEE ARTHROPLASTY (Left) as a surgical intervention .  The patient's history has been reviewed, patient examined, no change in status, stable for surgery.  I have reviewed the patient's chart and labs.  Questions were answered to the patient's satisfaction.     Fuller CanadaStanley Harrison

## 2016-08-15 NOTE — Transfer of Care (Signed)
Immediate Anesthesia Transfer of Care Note  Patient: Norma SearingBetty M Barton  Procedure(s) Performed: Procedure(s): TOTAL KNEE ARTHROPLASTY (Left)  Patient Location: PACU  Anesthesia Type:Spinal  Level of Consciousness: awake, alert , oriented and patient cooperative  Airway & Oxygen Therapy: Patient Spontanous Breathing and Patient connected to face mask oxygen  Post-op Assessment: Report given to RN and Post -op Vital signs reviewed and stable  Post vital signs: Reviewed and stable  Last Vitals:  Vitals:   08/15/16 0715 08/15/16 0720  BP: 125/67 128/74  Pulse:    Resp: 13 (!) 22  Temp:      Last Pain:  Vitals:   08/15/16 0656  TempSrc: Oral  PainSc: 3       Patients Stated Pain Goal: 9 (08/15/16 0656)  Complications: No apparent anesthesia complications

## 2016-08-15 NOTE — Anesthesia Procedure Notes (Signed)
Spinal  Patient location during procedure: OR Start time: 08/15/2016 7:42 AM Staffing Resident/CRNA: Joycelyn ManIDACAVAGE, Norma Barton Preanesthetic Checklist Completed: patient identified, site marked, surgical consent, pre-op evaluation, timeout performed, IV checked, risks and benefits discussed and monitors and equipment checked Spinal Block Patient position: left lateral decubitus Prep: Betadine Patient monitoring: heart rate, cardiac monitor, continuous pulse ox and blood pressure Approach: midline Location: L2-3 Injection technique: single-shot Needle Needle type: Spinocan  Needle gauge: 22 G Needle length: 5 cm Assessment Sensory level: T8 Additional Notes Clean,free flow CSF     [id number]        07/2017

## 2016-08-16 ENCOUNTER — Encounter (HOSPITAL_COMMUNITY): Payer: Self-pay | Admitting: Orthopedic Surgery

## 2016-08-16 LAB — BASIC METABOLIC PANEL
ANION GAP: 5 (ref 5–15)
BUN: 13 mg/dL (ref 6–20)
CHLORIDE: 105 mmol/L (ref 101–111)
CO2: 27 mmol/L (ref 22–32)
Calcium: 8.4 mg/dL — ABNORMAL LOW (ref 8.9–10.3)
Creatinine, Ser: 0.93 mg/dL (ref 0.44–1.00)
GFR calc non Af Amer: 57 mL/min — ABNORMAL LOW (ref >60)
Glucose, Bld: 137 mg/dL — ABNORMAL HIGH (ref 65–99)
POTASSIUM: 4.1 mmol/L (ref 3.5–5.1)
SODIUM: 137 mmol/L (ref 135–145)

## 2016-08-16 LAB — CBC
HEMATOCRIT: 36.9 % (ref 36.0–46.0)
HEMOGLOBIN: 12 g/dL (ref 12.0–15.0)
MCH: 33.1 pg (ref 26.0–34.0)
MCHC: 32.5 g/dL (ref 30.0–36.0)
MCV: 101.7 fL — ABNORMAL HIGH (ref 78.0–100.0)
Platelets: 105 10*3/uL — ABNORMAL LOW (ref 150–400)
RBC: 3.63 MIL/uL — AB (ref 3.87–5.11)
RDW: 12.7 % (ref 11.5–15.5)
WBC: 7.7 10*3/uL (ref 4.0–10.5)

## 2016-08-16 MED ORDER — ZOLPIDEM TARTRATE 5 MG PO TABS: 5.0000 mg | ORAL_TABLET | Freq: Once | ORAL | Status: DC

## 2016-08-16 MED ORDER — OXYCODONE-ACETAMINOPHEN 5-325 MG PO TABS
1.0000 | ORAL_TABLET | ORAL | Status: DC | PRN
  Administered 2016-08-16 – 2016-08-17 (×4): 1 via ORAL
  Filled 2016-08-16 (×4): qty 1

## 2016-08-16 NOTE — Clinical Social Work Note (Signed)
Clinical Social Work Assessment  Patient Details  Name: Norma Barton MRN: [id number] Date of Birth: 1935/08/21  Date of referral:  08/16/16               Reason for consult:  Discharge Planning                Permission sought to share information with:  Family Supports Permission granted to share information::  Yes, Verbal Permission Granted  Name::        Agency::     Relationship::  son, granddaughter, grandson  Sport and exercise psychologist Information:     Housing/Transportation Living arrangements for the past 2 months:  Single Family Home Source of Information:  Patient, Adult Children Patient Interpreter Needed:  None Criminal Activity/Legal Involvement Pertinent to Current Situation/Hospitalization:  No - Comment as needed Significant Relationships:  Adult Children, Other Family Members Lives with:  Self Do you feel safe going back to the place where you live?   (Pt and family were expecting for pt to go to rehab. ) Need for family participation in patient care:  No (Coment)  Care giving concerns:  Pt is concerned about the fact that she lives alone.    Social Worker assessment / plan:  CSW met with pt, pt's son and two grandchildren at bedside. Pt and family were upset upon CSW entering room as PT and CM had already met with pt and they are aware of recommendation for home health. Pt and son state that they had called insurance and were told that SNF was covered for 20 days, so they were expecting pt to go to SNF as she lives alone. CSW shared that SNF is covered if pt meets skilled criteria and at this time pt does not. She is post op day one total knee and is ambulating 150' and has started on stairs. Discussed that pt is not meeting SNF criteria and if they wanted to consider SNF it could be done private pay. Pt is not open to this idea. After second PT treatment, CM spoke to pt and family again and they have agreed to return home with home health. Pt has 5 sons and has had multiple family  members in room most of day. CSW signing off.   Employment status:  Retired Nurse, adult PT Recommendations:  Home with [place] / Referral to community resources:  [place]  Patient/Family's Response to care:  Pt will return home with home health.    Patient/Family's Understanding of and Emotional Response to Diagnosis, Current Treatment, and Prognosis:  Pt and pt's family very frustrated that they were told to expect SNF and that is not recommended now.  Emotional Assessment Appearance:  Appears stated age Attitude/Demeanor/Rapport:  Angry Affect (typically observed):  Other Engineer, materials) Orientation:  Oriented to Self, Oriented to Place, Oriented to  Time, Oriented to Situation Alcohol / Substance use:  Not Applicable Psych involvement (Current and /or in the community):  No (Comment)  Discharge Needs  Concerns to be addressed:  Discharge Planning Concerns Readmission within the last 30 days:  No Current discharge risk:  Lives alone Barriers to Discharge:  No Barriers Identified   Salome Arnt, [place] 08/16/2016, 3:40 PM [phone]

## 2016-08-16 NOTE — Anesthesia Postprocedure Evaluation (Signed)
Anesthesia Post Note  Patient: Norma SearingBetty M Barton  Procedure(s) Performed: Procedure(s) (LRB): TOTAL KNEE ARTHROPLASTY (Left)  Patient location during evaluation: Nursing Unit Anesthesia Type: Spinal Level of consciousness: awake and alert and oriented Pain management: satisfactory to patient Vital Signs Assessment: post-procedure vital signs reviewed and stable Respiratory status: spontaneous breathing Cardiovascular status: blood pressure returned to baseline Postop Assessment: no headache, no backache, no signs of nausea or vomiting and adequate PO intake Anesthetic complications: no    Last Vitals:  Vitals:   08/15/16 2100 08/16/16 0500  BP: (!) 104/48 (!) 103/38  Pulse: 79 86  Resp: 20 15  Temp: 36.6 C 36.6 C    Last Pain:  Vitals:   08/16/16 0615  TempSrc:   PainSc: 4                  Adair Lemar

## 2016-08-16 NOTE — Care Management Note (Addendum)
Case Management Note  Patient Details  Name: Norma Barton MRN: [id number] Date of Birth: 07/31/1935  Subjective/Objective: Patient adm for knee replacement. Patient from home alone. Expected to go to SNF after discharge, however PT recommended HH PT and insurance will not cover SNF. Patient offered choices of home health agencies.   Patient and family aware of PT recommendation for intermittent supervision.                  Action/Plan: Al[facility] and will obtain orders from chart. Patient will have HH PT and OT. Patient will have 3 in 1 and rollator delivered to room prior to discharge. CM called [employer] to confirm CPM was ordered for patient, unable to confirm. Left messages for Alric SetonLina with [employer] to return call.  Later entry: Spoke with [place], insurance will not cover CPM machine.    Expected Discharge Date:      08/17/2016            Expected Discharge Plan:  Home w Home Health Services  In-House Referral:  NA  Discharge planning Services  CM Consult  Post Acute Care Choice:  Home Health Choice offered to:  Patient, Adult Children  DME Arranged:  Walker rolling with seat DME Agency:  [facility]  HH Arranged:  PT HH Agency:  [facility]  Status of Service:  Completed, signed off  If discussed at Long Length of Stay Meetings, dates discussed:    Additional Comments:  Esiquio Boesen, Chrystine OilerSharley Diane, RN 08/16/2016, 4:37 PM

## 2016-08-16 NOTE — Progress Notes (Signed)
Physical Therapy Treatment Patient Details Name: Norma Barton MRN: [id number] DOB: 04/13/1935 Today's Date: 08/16/2016    History of Present Illness Pt is a 81 y/o female s/p left TKA on 08/15/16    PT Comments    Pt received sitting up in the chair, and initially requested to have PT later in the afternoon due to having visitors.  However, pt was then willing to participate in PT tx.  Long discussion with family regarding recommendations for pt to go home rather than to SNF.  Pt was able to transfer sit<>stand  Mod (I), and ambulated 16150ft with RW and supervision.  She negotiated 3 steps with Min A and family present for stair training.  Pt was placed on CPM at end of tx.  Continue to recommend that pt d/c home with HHPT, and BSC with supervision.   Follow Up Recommendations  Home health PT;Supervision/Assistance - 24 hour     Equipment Recommendations  3in1 (PT) (pt already has a RW)    Recommendations for Other Services       Precautions / Restrictions Precautions Precautions: Fall Precaution Comments: Due to recent surgery Restrictions Weight Bearing Restrictions: No    Mobility  Bed Mobility Overal bed mobility: Independent Bed Mobility: Supine to Sit     Supine to sit: Independent (bed flat) Sit to supine: Independent      Transfers Overall transfer level: Modified independent Equipment used: Rolling walker (2 wheeled) Transfers: Sit to/from Stand Sit to Stand: Modified independent (Device/Increase time);Supervision (vc's to get lined up and to reach back for the bed prior to sitting down. ) Stand pivot transfers: Min guard (vc's for hand placement, however, pt still demonstrated poor eccentric control down into the chair. )          Ambulation/Gait Ambulation/Gait assistance: Supervision Ambulation Distance (Feet): 150 Feet Assistive device: Rolling walker (2 wheeled) Gait Pattern/deviations: Step-through pattern;Antalgic   Gait velocity  interpretation: <1.8 ft/sec, indicative of risk for recurrent falls General Gait Details: Pt demonstrated L knee buckle x 1 when she took a longer step.  Continued occasional cues for upright posture, but improved this PM.     Stairs Stairs: Yes Stairs assistance: Min assist Stair Management: Step to pattern;One rail Right;Forwards Number of Stairs: 3 General stair comments: HHA on the left and pt educated on leading up with the R LE, and down with the L LE.  Pt's Granddaugther and another family/friend was present for stair training.  Wheelchair Mobility    Modified Rankin (Stroke Patients Only)       Balance Overall balance assessment: Needs assistance Sitting-balance support: Bilateral upper extremity supported;Feet supported Sitting balance-Leahy Scale: Normal     Standing balance support: Bilateral upper extremity supported Standing balance-Leahy Scale: Fair                      Cognition Arousal/Alertness: Awake/alert Behavior During Therapy: WFL for tasks assessed/performed Overall Cognitive Status: Within Functional Limits for tasks assessed                      Exercises Total Joint Exercises Ankle Circles/Pumps: Strengthening;Both;10 reps;Supine Quad Sets: Strengthening;Both;10 reps;Supine Gluteal Sets: Strengthening;Both;10 reps;Supine Short Arc Quad: Strengthening;AAROM;Both;10 reps;Supine General Exercises - Lower Extremity Ankle Circles/Pumps: AROM;Both;10 reps;Supine Quad Sets: Strengthening;Both;10 reps;Supine Gluteal Sets: Strengthening;Both;10 reps;Supine    General Comments        Pertinent Vitals/Pain Pain Assessment: 0-10 Pain Score: 6  Pain Location: L knee Pain Descriptors / Indicators: Aching  Pain Intervention(s): Limited activity within patient's tolerance;Monitored during session;Repositioned    Home Living                      Prior Function            PT Goals (current goals can now be found in the  care plan section) Acute Rehab PT Goals Patient Stated Goal: Pt wants to get stronger.  PT Goal Formulation: With patient/family Time For Goal Achievement: 08/23/16 Potential to Achieve Goals: Good Progress towards PT goals: Progressing toward goals    Frequency    BID      PT Plan Current plan remains appropriate    Co-evaluation             End of Session Equipment Utilized During Treatment: Gait belt Activity Tolerance: Patient tolerated treatment well Patient left: in chair;with call bell/phone within reach;with family/visitor present     Time: [id number] PT Time Calculation (min) (ACUTE ONLY): 49 min  Charges:  $Gait Training: 23-37 mins $Therapeutic Exercise: 8-22 mins $Therapeutic Activity: 8-22 mins                    G Codes:  Functional Assessment Tool Used: [employer] AM-PAC "6-clicks"  Functional Limitation: Mobility: Walking and moving around Mobility: Walking and Moving Around Current Status [phone](G8978): At least 20 percent but less than 40 percent impaired, limited or restricted Mobility: Walking and Moving Around Goal Status [phone](G8979): At least 1 percent but less than 20 percent impaired, limited or restricted   Beth Buna Cuppett, PT, DPT X: [phone]

## 2016-08-16 NOTE — Op Note (Signed)
08/15/2016  9:35 AM  PATIENT:  Norma Barton  81 y.o. female  PRE-OPERATIVE DIAGNOSIS:  LEFT KNEE OSTEOARTHRITIS  POST-OPERATIVE DIAGNOSIS:  LEFT KNEE OSTEOARTHRITIS  PROCEDURE:  Procedure(s): TOTAL KNEE ARTHROPLASTY (Left)  SURGEON:  Surgeon(s) and Role:    * Vickki HearingStanley E Scherry Laverne, MD - Primary  PHYSICIAN ASSISTANT:   ASSISTANTS: Mariaeduarda ashley and debbie dallas   ANESTHESIA:   spinal  EBL:  Total I/O In: 1700 [I.V.:1700] Out: 25 [Blood:25]  BLOOD ADMINISTERED:none  DRAINS: hemovac   LOCAL MEDICATIONS USED:  MARCAINE    and Amount: 30 ml + exparel 20/40  SPECIMEN:  No Specimen  DISPOSITION OF SPECIMEN:  N/A  COUNTS:  YES  TOURNIQUET:   Total Tourniquet Time Documented: Thigh (Left) - 75 minutes Total: Thigh (Left) - 75 minutes   DICTATION: .Reubin Milanragon Dictation  PLAN OF CARE: Admit to inpatient   PATIENT DISPOSITION:  PACU - hemodynamically stable.   Delay start of Pharmacological VTE agent (>24hrs) due to surgical blood loss or risk of bleeding: yes   Surgical dictation for left total knee  Preop diagnosis osteoarthritis left knee  Postop diagnosis osteoarthritis left knee  Surgeon Dr. Romeo AppleHarrison [phone]  Anesthetic spinal  Implants DEPUY  SIGMA PS FB   SIZES:    F 2   T 2.5   P 35  Poly 10  Drains: one Hemovac drain in the joint   Exparel 20MG  DILUTED W/ 40 CC SALINE   Marcaine with epinephrine    Operative findings : mod varus, mild (5) flex contracture, medial compartment grad4 degen with med osteophytes Patella grade 2     details of procedure:   The patient was identified in the preop holding area and the surgical site was confirmed as the left knee. Chart review and update were completed. The patient was taken to the operating room for spinal anesthesia. After successful spinal anesthesia Foley catheter was inserted. The patient was placed supine on the operating table.   the left leg was prepped with DuraPrep and draped sterilely.  Timeout was completed. The limb was then exsanguinated a  6 inch Esmarch. The tourniquet was elevated to 300 mmHg.   A midline incision was made and taken down to the extensor mechanism followed by medial arthrotomy. The patella was everted. A synovectomy was performed as needed. The osteophytes were resected.  Anterior cruciate ligament and PCL and medial and lateral meniscus were resected.   a 3/8 inch drill bit was used to enter the femoral canal which was suctioned and irrigated until the fluid was clear. The distal femoral cut was set for 11 millimeter resection with a 5   Left Valgus angle. This cut was completed and checked for flatness.   the femur was then measured to a size 2.  The cutting block was placed to match the epicondyles and the 4 distal cuts were made.   the tibia was subluxated forward and the external alignment guide was placed. We removed 10 mm of bone from the higher  lateral side. We set the guide for neutral varus valgus cut related to the  Mechanical axis of the tibia and for slope matching the patient's anatomy. Rotational alignment was set using the tibial tubercle, tibial spine and second metatarsal. The cutting block was pinned and the proximal tibia was resected.    spacer blocks were placed starting with a 10 mm insert to confirm equal flexion-extension gaps. A size  10  mm insert balanced the gaps.   We placed  the femoral notch cutting guide size 2  and resected the notch.   Trial implants were placed using appropriate size femur , appropriate size tibial baseplate which was measured after the proximal tibia resection. Tibial rotation was set patella tracking was normal   The tibia was then punched per manufacture technique making sure to avoid internal rotation.   The patella measured a size 22   We resected down to a size 12 using a size 35 x 9 button.   Final range of motion check was performed with the appropriate size trials as mentioned above.  Satisfactory reduction and motion were obtained.   Trial implants were removed. The bone was irrigated and dried and the cement was mixed on the back table  exparel was injected in the soft tissues and posterior capsule of the knee  These implants were then cemented in place. Excess cement was removed. The cement was allowed to cure. Second irrigation was performed.    FInal range of motion check and stability check was completed  The wound was irrigated third time Hemovac drain was placed, extensor mechanism was closed with #1 Nurolon followed by 0 Monocryl and staples to reapproximate the skin edges and subcutaneous tissue.   Sterile dressing was applied  The patient was taken recovery in stable condition   [id number]        

## 2016-08-16 NOTE — Progress Notes (Signed)
Foley removed at 0615. Patient tolerated well. Patient reminded to call for assistance if bathroom needed.

## 2016-08-16 NOTE — Evaluation (Addendum)
Occupational Therapy Evaluation Patient Details Name: Norma HornerBetty M Barton MRN: [id number] DOB: 11/15/1934 Today's Date: 08/16/2016    History of Present Illness Pt is a 81 y/o female s/p left TKA on 08/15/16   Clinical Impression   Pt awake, alert, oriented x4 this am, agreeable to OT evaluation. Pt reports increased pain this am, has requested pain medication, however is agreeable to attempt bed mobility. Pt able to perform bed mobility with min assist for unweighting to bring left leg to EOB. Pt reports decrease in pain while sitting at EOB. Pt currently requires set-up to max assist for ADL tasks due to pain limiting mobility and ADL completion. PTA pt was independent in ADL and IADL tasks, pt lives alone with limited assistance available on discharge. Recommend HHOT evaluation to assess ADL completion and functional mobility in the home environment.    Follow Up Recommendations  HHOT   Equipment Recommendations  None recommended by OT       Precautions / Restrictions Precautions Precautions: Fall Restrictions Weight Bearing Restrictions: Yes LLE Weight Bearing: Weight bearing as tolerated      Mobility Bed Mobility Overal bed mobility: Needs Assistance Bed Mobility: Supine to Sit;Sit to Supine     Supine to sit: Min assist;HOB elevated (min assist to bring left leg to EOB) Sit to supine: Min assist (min assist to bring left leg into bed)      Transfers                 General transfer comment: Not tested         ADL Overall ADL's : Needs assistance/impaired Eating/Feeding: Set up;Bed level                   Lower Body Dressing: Maximal assistance;Sitting/lateral leans                 General ADL Comments: Pt requires min-max assist for ADL tasks at this time due to pain     Vision Vision Assessment?: No apparent visual deficits          Pertinent Vitals/Pain Pain Assessment: 0-10 Pain Score: 5  Pain Location: left knee Pain  Descriptors / Indicators: Aching Pain Intervention(s): Limited activity within patient's tolerance;Monitored during session;Repositioned     Hand Dominance Right   Extremity/Trunk Assessment Upper Extremity Assessment Upper Extremity Assessment: Overall WFL for tasks assessed   Lower Extremity Assessment Lower Extremity Assessment: Defer to PT evaluation       Communication Communication Communication: No difficulties   Cognition Arousal/Alertness: Awake/alert Behavior During Therapy: WFL for tasks assessed/performed Overall Cognitive Status: Within Functional Limits for tasks assessed                                Home Living  Living Arrangements: Alone                                      Prior Functioning/Environment Level of Independence: Independent                 OT Problem List: Decreased activity tolerance;Impaired balance (sitting and/or standing);Pain    End of Session    Activity Tolerance: Patient limited by pain Patient left: in bed;with call bell/phone within reach;with bed alarm set   Time: [id number] OT Time Calculation (min): 15 min Charges:  OT General Charges $OT  Visit: 1 Procedure OT Evaluation $OT Eval Moderate Complexity: 1 Procedure   Ezra SitesLeslie Troxler, OTR/L  [phone] 08/16/2016, 9:17 AM

## 2016-08-16 NOTE — Plan of Care (Signed)
Problem: Food- and Nutrition-Related Knowledge Deficit (NB-1.1) Goal: Nutrition education Formal process to instruct or train a patient/client in a skill or to impart knowledge to help patients/clients voluntarily manage or modify food choices and eating behavior to maintain or improve health. Outcome: Adequate for Discharge Nutrition Education Note  Patient family request for nutrition education regarding a Heart Healthy diet.  Patient identified through malnutrition screen. Her weight is stable according to hx but patient says she has not been eating well lately due to painful knee. She just didn't feel like getting up to prepare food. Based on her hx expect recent intake has been inadequate but has not triggered as significant.    Lipid Panel     Component Value Date/Time   CHOL 156 10/31/2015 1558   TRIG 160 (H) 10/31/2015 1558   TRIG 184 (H) 12/14/2014 1511   HDL 56 10/31/2015 1558   HDL 47 12/14/2014 1511   CHOLHDL 2.8 10/31/2015 1558   LDLCALC 68 10/31/2015 1558   LDLCALC 65 02/03/2014 1619    RD provided "Nutrition Guide after 50 and Choosing Healthy Meals as you get Older " handouts . Reviewed dietary recall. Provided examples on ways to decrease sodium and fat intake in diet. Discouraged intake of processed foods and use of salt shaker. Encouraged fresh fruits and vegetables as well as whole grain sources of carbohydrates to maximize fiber intake. Teach back method used.  Expect good compliance.  Body mass index is 28.44 kg/m. Pt meets criteria for overweight based on current BMI.  Current diet order is Regular, patient is consuming approximately 50% of meals at this time.   Labs and medications reviewed. No further nutrition interventions warranted at this time. RD contact information provided. Patient is expecting to go home tomorrow.  Royann ShiversLynn Staceyann Knouff MS,RD,CSG,LDN Office: [phone] Pager: [phone]

## 2016-08-16 NOTE — Addendum Note (Signed)
Addendum  created 08/16/16 [id number] by Moshe SalisburyKaren E Jaydn Moscato, CRNA   Sign clinical note

## 2016-08-16 NOTE — Evaluation (Signed)
Physical Therapy Evaluation Patient Details Name: Norma Barton MRN: [id number] DOB: 05/06/1935 Today's Date: 08/16/2016   History of Present Illness  Pt is a 81 y/o female s/p left TKA on 08/15/16  Clinical Impression  Pt received in bed, and is agreeable to PT evaluation.  Pt expressed that she lives alone, and recently has started using a RW when she is feeling really bad, and also a cane for ambulation.  She is independent with all ADL's, driving, and is a Tourist information centre managercommunity ambulator.  During PT evaluation today she was independent with supine<>sit and required Min guard for sit<>stand.  She was able to ambulate 16700ft with RW and Min guard.  She did express feeling her L knee buckle x 2 during gait, however there was no noted LOB with this.  Will need to progress quad strength and contraction at next visit, as well as practice steps.  She is recommended to d/c home with HHPT, however she currently does not have anyone who could stay with her.  Began discussion with the patient and her family in regards to finding someone and possibly taking shifts to stay with her for a little while.      Follow Up Recommendations Home health PT;Supervision/Assistance - 24 hour    Equipment Recommendations  3in1 (PT) (Pt already has a RW. )    Recommendations for Other Services       Precautions / Restrictions Precautions Precautions: Fall Precaution Comments: Due to recent surgery Restrictions Weight Bearing Restrictions: No LLE Weight Bearing: Weight bearing as tolerated      Mobility  Bed Mobility Overal bed mobility: Independent Bed Mobility: Supine to Sit     Supine to sit: Independent (bed flat) Sit to supine: Min assist (min assist to bring left leg into bed)      Transfers Overall transfer level: Needs assistance Equipment used: Rolling walker (2 wheeled) Transfers: Sit to/from [employer]GI CorporationStand;Stand Pivot Transfers Sit to Stand: Min guard Stand pivot transfers: Min guard (vc's for hand  placement, however, pt still demonstrated poor eccentric control down into the chair. )       General transfer comment: Not tested  Ambulation/Gait Ambulation/Gait assistance: Min guard Ambulation Distance (Feet): 100 Feet Assistive device: Rolling walker (2 wheeled) Gait Pattern/deviations: Step-through pattern;Antalgic   Gait velocity interpretation: <1.8 ft/sec, indicative of risk for recurrent falls General Gait Details: pt expressed feeling her L knee buckle x 2 during gait, however pt did not demonstrate LOB when this occurred.  Pt required vc's for posture, and she is able to correct, but not maintain.    Stairs            Wheelchair Mobility    Modified Rankin (Stroke Patients Only)       Balance Overall balance assessment: Needs assistance Sitting-balance support: Bilateral upper extremity supported;Feet supported Sitting balance-Leahy Scale: Normal     Standing balance support: Bilateral upper extremity supported Standing balance-Leahy Scale: Fair                               Pertinent Vitals/Pain Pain Assessment: 0-10 Pain Score: 6  Pain Location: L knee Pain Descriptors / Indicators: Aching Pain Intervention(s): Limited activity within patient's tolerance;Monitored during session;Premedicated before session;Repositioned    Home Living   Living Arrangements: Alone   Type of Home: House Home Access: Stairs to enter   Enter[employer] CorporationEntrance Stairs-Number of Steps: 5 with HR.  Home Layout: One level Home Equipment: Gilmer Morane -  single point;Walker - 2 wheels (Son is supposed to put in a grab bar beside the toilet this weekend. )      Prior Function Level of Independence: Independent with assistive device(s)   Gait / Transfers Assistance Needed: Pt states she had use the RW when the pain was really bad, and most recently she was using the cane.    ADL's / Homemaking Assistance Needed: independent with dressing/bathing, driving - community ambulator.         Hand Dominance   Dominant Hand: Right    Extremity/Trunk Assessment   Upper Extremity Assessment: Overall WFL for tasks assessed           Lower Extremity Assessment: LLE deficits/detail   LLE Deficits / Details: Grossly 3/5 strength     Communication   Communication: No difficulties  Cognition Arousal/Alertness: Awake/alert Behavior During Therapy: WFL for tasks assessed/performed Overall Cognitive Status: Within Functional Limits for tasks assessed                      General Comments      Exercises Total Joint Exercises Ankle Circles/Pumps: Strengthening;Both;10 reps;Supine Quad Sets: Strengthening;Both;10 reps;Supine Gluteal Sets: Strengthening;Both;10 reps;Supine Short Arc Quad: Strengthening;AAROM;Both;10 reps;Supine   Assessment/Plan    PT Assessment Patient needs continued PT services  PT Problem List Decreased strength;Decreased range of motion;Decreased activity tolerance;Decreased balance;Decreased mobility;Decreased knowledge of use of DME;Decreased safety awareness;Decreased knowledge of precautions;Pain;Decreased skin integrity          PT Treatment Interventions DME instruction;Gait training;Stair training;Functional mobility training;Therapeutic activities;Therapeutic exercise;Balance training;Neuromuscular re-education;Cognitive remediation;Patient/family education    PT Goals (Current goals can be found in the Care Plan section)  Acute Rehab PT Goals Patient Stated Goal: Pt wants to get stronger.  PT Goal Formulation: With patient/family Time For Goal Achievement: 08/23/16 Potential to Achieve Goals: Good    Frequency BID   Barriers to discharge Decreased caregiver support Pt lives alone    Co-evaluation               End of Session Equipment Utilized During Treatment: Gait belt Activity Tolerance: Patient tolerated treatment well Patient left: in chair;with call bell/phone within reach;with family/visitor  present Nurse Communication:  Shanda Bumps(Jessica, RN notified of pt's mobiltiy status, as well as location. Mobility sheet left in the pt's room. )    Functional Assessment Tool Used: [employer] AM-PAC "6-clicks"  Functional Limitation: Mobility: Walking and moving around Mobility: Walking and Moving Around Current Status [phone]([id number]): At least 20 percent but less than 40 percent impaired, limited or restricted Mobility: Walking and Moving Around Goal Status [phone](G8979): At least 1 percent but less than 20 percent impaired, limited or restricted    Time: 0924-1007 PT Time Calculation (min) (ACUTE ONLY): 43 min   Charges:   PT Evaluation $PT Eval Low Complexity: 1 Procedure PT Treatments $Gait Training: 8-22 mins $Therapeutic Exercise: 8-22 mins   PT G Codes:   PT G-Codes **NOT FOR INPATIENT CLASS** Functional Assessment Tool Used: [employer] AM-PAC "6-clicks"  Functional Limitation: Mobility: Walking and moving around Mobility: Walking and Moving Around Current Status [phone]([id number]): At least 20 percent but less than 40 percent impaired, limited or restricted Mobility: Walking and Moving Around Goal Status [phone](G8979): At least 1 percent but less than 20 percent impaired, limited or restricted    Beth Talia Hoheisel, PT, DPT X: [phone]

## 2016-08-16 NOTE — Progress Notes (Signed)
Patient ID: Norma HornerBetty M Barton, female   DOB: 02/25/1935, 81 y.o.   MRN: [id number]  POD # 1  Status post left total knee   VS BP (!) 103/38   Pulse 86   Temp 97.9 F (36.6 C) (Oral)   Resp 15   Ht 5' 2.5" (1.588 m)   Wt 158 lb (71.7 kg)   SpO2 99%   BMI 28.44 kg/m    LABS  CBC Latest Ref Rng & Units 08/16/2016 08/15/2016 08/12/2016  WBC 4.0 - 10.5 K/uL 7.7 - 4.6  Hemoglobin 12.0 - 15.0 g/dL 40.912.0 81.115.0 91.414.1  Hematocrit 36.0 - 46.0 % 36.9 44.0 42.7  Platelets 150 - 400 K/uL 105(L) - 146(L)   BMP Latest Ref Rng & Units 08/16/2016 08/15/2016 08/12/2016  Glucose 65 - 99 mg/dL 782(N137(H) 562(Z126(H) 308(M193(H)  BUN 6 - 20 mg/dL 13 - 14  Creatinine 5.780.44 - 1.00 mg/dL 4.690.93 - 6.290.82  BUN/Creat Ratio 11 - 26 - - -  Sodium 135 - 145 mmol/L 137 143 139  Potassium 3.5 - 5.1 mmol/L 4.1 3.9 2.6(LL)  Chloride 101 - 111 mmol/L 105 - 98(L)  CO2 22 - 32 mmol/L 27 - 31  Calcium 8.9 - 10.3 mg/dL 5.2(W8.4(L) - 9.5     DRESSING scant drainage   Neuro-vasculo-motor status of operative limb normal   Homans sign normal calf supple  Assessment status post total knee arthroplasty patient doing well  Plan continue physical therapy. Provide adequate pain control.

## 2016-08-17 LAB — CBC
HEMATOCRIT: 35.1 % — AB (ref 36.0–46.0)
HEMOGLOBIN: 11.7 g/dL — AB (ref 12.0–15.0)
MCH: 33.1 pg (ref 26.0–34.0)
MCHC: 33.3 g/dL (ref 30.0–36.0)
MCV: 99.4 fL (ref 78.0–100.0)
PLATELETS: 119 10*3/uL — AB (ref 150–400)
RBC: 3.53 MIL/uL — AB (ref 3.87–5.11)
RDW: 12.5 % (ref 11.5–15.5)
WBC: 12.4 10*3/uL — AB (ref 4.0–10.5)

## 2016-08-17 MED ORDER — HYDROCODONE-ACETAMINOPHEN 10-325 MG PO TABS: 1.0000 | ORAL_TABLET | ORAL | 0 refills | Status: DC | PRN

## 2016-08-17 MED ORDER — ASPIRIN 325 MG PO TBEC: 325.0000 mg | DELAYED_RELEASE_TABLET | Freq: Every day | ORAL | 0 refills | Status: DC

## 2016-08-17 NOTE — Progress Notes (Signed)
Patient ID: Norma HornerBetty M Barton, female   DOB: 02/05/1935, 81 y.o.   MRN: [id number] Pod 2 status post left total knee  BP 110/61 (BP Location: Left Arm)   Pulse 84   Temp 98 F (36.7 C) (Oral)   Resp 16   Ht 5' 2.5" (1.588 m)   Wt 158 lb (71.7 kg)   SpO2 100%   BMI 28.44 kg/m  CBC Latest Ref Rng & Units 08/17/2016 08/16/2016 08/15/2016  WBC 4.0 - 10.5 K/uL 12.4(H) 7.7 -  Hemoglobin 12.0 - 15.0 g/dL 11.7(L) 12.0 15.0  Hematocrit 36.0 - 46.0 % 35.1(L) 36.9 44.0  Platelets 150 - 400 K/uL 119(L) 105(L) -   BMP Latest Ref Rng & Units 08/16/2016 08/15/2016 08/12/2016  Glucose 65 - 99 mg/dL 409(W137(H) 119(J126(H) 478(G193(H)  BUN 6 - 20 mg/dL 13 - 14  Creatinine 9.560.44 - 1.00 mg/dL 2.130.93 - 0.860.82  BUN/Creat Ratio 11 - 26 - - -  Sodium 135 - 145 mmol/L 137 143 139  Potassium 3.5 - 5.1 mmol/L 4.1 3.9 2.6(LL)  Chloride 101 - 111 mmol/L 105 - 98(L)  CO2 22 - 32 mmol/L 27 - 31  Calcium 8.9 - 10.3 mg/dL 5.7(Q8.4(L) - 9.5   Stairs Stairs: Yes Stairs assistance: Min assist Stair Management: Step to pattern;One rail Right;Forwards Number of Stairs: 3 General stair comments: HHA on the left and pt educated on leading up with the R LE, and down with the L LE.  Pt's Granddaugther and another family/friend was present for stair training. Transfers Overall transfer level: Modified independent Equipment used: Rolling walker (2 wheeled) Transfers: Sit to/from Stand Sit to Stand: Modified independent (Device/Increase time);Supervision (vc's to get lined up and to reach back for the bed prior to sitting down. ) Stand pivot transfers: Min guard (vc's for hand placement, however, pt still demonstrated poor eccentric control down into the chair. )     Ambulation/Gait Ambulation/Gait assistance: Supervision Ambulation Distance (Feet): 150 Feet Assistive device: Rolling walker (2 wheeled) Gait Pattern/deviations: Step-through pattern;Antalgic   Gait velocity interpretation: <1.8 ft/sec, indicative of risk for recurrent  falls General Gait Details: Pt demonstrated L knee buckle x 1 when she took a longer step.  Continued occasional cues for upright posture, but improved this PM.    Dressing change clean incision   Discharge today

## 2016-08-17 NOTE — Progress Notes (Signed)
CM received information and faxed to [facility] to start services.

## 2016-08-17 NOTE — Discharge Summary (Signed)
Physician Discharge Summary  Patient ID: Norma Barton MRN: [id number] DOB/AGE: 79/05/1935 79 y.o.  Admit date: 08/15/2016 Discharge date: 08/17/2016  Admission Diagnoses: LEFT KNEE OA   Discharge Diagnoses: LEFT KNEE OA  Active Problems:   Arthritis of left knee   Arthritis of knee, degenerative   Discharged Condition: good  Hospital Course:  HD 1 11/30 SURGERY LEFT TKA SPINAL   Consults: None  CBC Latest Ref Rng & Units 08/17/2016 08/16/2016 08/15/2016  WBC 4.0 - 10.5 K/uL 12.4(H) 7.7 -  Hemoglobin 12.0 - 15.0 g/dL 11.7(L) 12.0 15.0  Hematocrit 36.0 - 46.0 % 35.1(L) 36.9 44.0  Platelets 150 - 400 K/uL 119(L) 105(L) -   BMP Latest Ref Rng & Units 08/16/2016 08/15/2016 08/12/2016  Glucose 65 - 99 mg/dL 528(U137(H) 132(G126(H) 401(U193(H)  BUN 6 - 20 mg/dL 13 - 14  Creatinine 2.720.44 - 1.00 mg/dL 5.360.93 - 6.440.82  BUN/Creat Ratio 11 - 26 - - -  Sodium 135 - 145 mmol/L 137 143 139  Potassium 3.5 - 5.1 mmol/L 4.1 3.9 2.6(LL)  Chloride 101 - 111 mmol/L 105 - 98(L)  CO2 22 - 32 mmol/L 27 - 31  Calcium 8.9 - 10.3 mg/dL 0.3(K8.4(L) - 9.5    Discharge Exam: Blood pressure 110/61, pulse 84, temperature 98 F (36.7 C), temperature source Oral, resp. rate 16, height 5' 2.5" (1.588 m), weight 158 lb (71.7 kg), SpO2 100 %. General appearance: alert, cooperative and appears stated age Neurologic: Grossly normal Incision/Wound:CLEAN  Stairs Stairs: Yes Stairs assistance: Min assist Stair Management: Step to pattern;One rail Right;Forwards Number of Stairs: 3 General stair comments: HHA on the left and pt educated on leading up with the R LE, and down with the L LE.  Pt's Granddaugther and another family/friend was present for stair training. Transfers Overall transfer level: Modified independent Equipment used: Rolling walker (2 wheeled) Transfers: Sit to/from Stand Sit to Stand: Modified independent (Device/Increase time);Supervision (vc's to get lined up and to reach back for the bed prior to sitting  down. ) Stand pivot transfers: Min guard (vc's for hand placement, however, pt still demonstrated poor eccentric control down into the chair. )   Ambulation/Gait Ambulation/Gait assistance: Supervision Ambulation Distance (Feet): 150 Feet Assistive device: Rolling walker (2 wheeled) Gait Pattern/deviations: Step-through pattern;Antalgic   Gait velocity interpretation: <1.8 ft/sec, indicative of risk for recurrent falls General Gait Details: Pt demonstrated L knee buckle x 1 when she took a longer step.  Continued occasional cues for upright posture, but improved this PM.     Disposition: Final discharge disposition not confirmed  Discharge Instructions    Call MD / Call 911    Complete by:  As directed    If you experience chest pain or shortness of breath, CALL 911 and be transported to the hospital emergency room.  If you develope a fever above 101 F, pus (white drainage) or increased drainage or redness at the wound, or calf pain, call your surgeon's office.   Change dressing    Complete by:  As directed    Do not change dressing   Constipation Prevention    Complete by:  As directed    Drink plenty of fluids.  Prune juice may be helpful.  You may use a stool softener, such as Colace (over the counter) 100 mg twice a day.  Use MiraLax (over the counter) for constipation as needed.   Diet - low sodium heart healthy    Complete by:  As directed    Driving  restrictions    Complete by:  As directed    No driving for 3 weeks   Increase activity slowly as tolerated    Complete by:  As directed    TED hose    Complete by:  As directed    Use stockings (TED hose) for 2 weeks on both leg(s).  You may remove them at night for sleeping.       Medication List    STOP taking these medications   HYDROcodone-acetaminophen 7.5-325 MG tablet Commonly known as:  NORCO Replaced by:  HYDROcodone-acetaminophen 10-325 MG tablet   Potassium Chloride ER 20 MEQ Tbcr     TAKE these  medications   aspirin 325 MG EC tablet Take 1 tablet (325 mg total) by mouth daily with breakfast. Start taking on:  08/18/2016   chlorthalidone 25 MG tablet Commonly known as:  HYGROTON TAKE 1 TABLET EVERY DAY   HYDROcodone-acetaminophen 10-325 MG tablet Commonly known as:  NORCO Take 1 tablet by mouth every 4 (four) hours as needed. Replaces:  HYDROcodone-acetaminophen 7.5-325 MG tablet   metoprolol succinate 25 MG 24 hr tablet Commonly known as:  TOPROL-XL TAKE 1 TABLET (25 MG TOTAL) BY MOUTH DAILY.   ranitidine 150 MG tablet Commonly known as:  ZANTAC Take 150 mg by mouth 2 (two) times daily.   tetrahydrozoline-zinc 0.05-0.25 % ophthalmic solution Commonly known as:  VISINE-AC Place 1-2 drops into both eyes 3 (three) times daily as needed (for dry/irritated eyes).            Durable Medical Equipment        Start     Ordered   08/16/16 1459  For home use only DME Other see comment  Once    Comments:  4 wheeled walker with a seat (rollator)   08/16/16 1459   08/16/16 1412  For home use only DME 3 n 1  Once     08/16/16 1412   08/15/16 1517  DME Walker rolling  Once    Comments:  Home use  Question:  Patient needs a walker to treat with the following condition  Answer:  Gait abnormality   08/15/16 1516   08/15/16 1517  DME 3 n 1  Once    Comments:  Home use   08/15/16 1516   08/15/16 1517  DME Bedside commode  Once    Comments:  Home use  Question:  Patient needs a bedside commode to treat with the following condition  Answer:  S/P knee replacement   08/15/16 1516       Signed: Fuller CanadaStanley Boby Eyer 08/17/2016, 10:53 AM

## 2016-08-17 NOTE — Progress Notes (Signed)
Physical Therapy Treatment Patient Details Name: Norma HornerBetty M Barton MRN: [id number] DOB: 04/20/1935 Today's Date: 08/17/2016    History of Present Illness Pt is a 81 y/o female s/p left TKA on 08/15/16.     PT Comments    Pt progressing well this session, able to progress gait distance, sets/reps of exercises, addition of SAQ/SLR without any needed physical assistance to perform, multiple reps of STs transfers with good demonstrated safety awareness and safe use of AD. Pt able to perform 3x30sec narrow stance with eyes closed and minimal postural sway (2 LOB). Pt making progress toward goals, denies additional questions at this time to prepare for DC. Pt is ready for DC at this time from PT standpoint. All education complete.    Follow Up Recommendations  Home health PT;Supervision/Assistance - 24 hour     Equipment Recommendations  3in1 (PT)    Recommendations for Other Services       Precautions / Restrictions Precautions Precautions: Fall Precaution Comments: Due to recent surgery Restrictions Weight Bearing Restrictions: Yes LLE Weight Bearing: Weight bearing as tolerated    Mobility  Bed Mobility Overal bed mobility: Independent Bed Mobility: Supine to Sit              Transfers Overall transfer level: Needs assistance Equipment used: Rolling walker (2 wheeled) Transfers: Sit to/from Stand Sit to Stand: Supervision         General transfer comment: Performed 5x for strengthening and education on form, safe DME use.   Ambulation/Gait Ambulation/Gait assistance: Supervision Ambulation Distance (Feet): 180 Feet Assistive device: Rolling walker (2 wheeled) Gait Pattern/deviations: Step-through pattern;Decreased step length - left Gait velocity: 0.7072m/s  Gait velocity interpretation: <1.8 ft/sec, indicative of risk for recurrent falls     Stairs         General stair comments: Pt reports confidence with stairs training 1DA and asks to defer stairs  today.   Wheelchair Mobility    Modified Rankin (Stroke Patients Only)       Balance               Standing balance comment: 3x30sec narrow stance eyes closed: LOB x2, sel;f correcting.                     Cognition Arousal/Alertness: Awake/alert Behavior During Therapy: WFL for tasks assessed/performed Overall Cognitive Status: Within Functional Limits for tasks assessed                      Exercises Total Joint Exercises Quad Sets: Strengthening;Supine;15 reps;Left Short Arc Quad: Strengthening;Supine;15 reps;Left (excellent strength, minimal pain exacerbation) Heel Slides: AROM;AAROM;20 reps;Supine;Left (10x AAROM, 10x AROM) Hip ABduction/ADduction: AROM;Left;Supine;15 reps Straight Leg Raises: AROM;AAROM;Left;15 reps;Supine (excellent strength, minimal pain exacerbation) Goniometric ROM: 9-85 degrees Left knee fleixon supine     General Comments        Pertinent Vitals/Pain Pain Assessment: 0-10 Pain Score: 0-No pain (at rest, mild increase with actiivty) Pain Intervention(s): Limited activity within patient's tolerance;Monitored during session;Premedicated before session;Repositioned;Ice applied    Home Living                      Prior Function            PT Goals (current goals can now be found in the care plan section) Acute Rehab PT Goals Patient Stated Goal: Pt wants to get stronger.  PT Goal Formulation: With patient/family Time For Goal Achievement: 08/23/16 Potential to Achieve Goals: Good Progress  towards PT goals: Progressing toward goals    Frequency    BID      PT Plan Current plan remains appropriate    Co-evaluation             End of Session Equipment Utilized During Treatment: Gait belt Activity Tolerance: Patient tolerated treatment well;No increased pain Patient left: in chair;with call bell/phone within reach;with family/visitor present     Time: [id number] PT Time Calculation (min)  (ACUTE ONLY): 23 min  Charges:  $Gait Training: 8-22 mins $Therapeutic Exercise: 8-22 mins                    G Codes:      11:40 AM, 08/17/16 Rosamaria LintsAllan C Buccola, PT, DPT Physical Therapist [facility] [phone] (office)  [phone] (mobile)

## 2016-08-17 NOTE — Progress Notes (Signed)
Discharge instructions were reviewed with patient, family at bedside per patient request. Given copy of AVS and prescription. Pt and family verbalized understanding of instructions. Pt aware of home health arranged. No c/o prior to discharge. cryocuff sent home with patient. Verbalized understanding of how to use. Pt left floor in stable condition via w/c accompanied by family and nursing staff. Earnstine RegalAshley Domanik [place], RN

## 2016-08-18 DIAGNOSIS — M1711 Unilateral primary osteoarthritis, right knee: Secondary | ICD-10-CM | POA: Diagnosis not present

## 2016-08-18 DIAGNOSIS — Z471 Aftercare following joint replacement surgery: Secondary | ICD-10-CM | POA: Diagnosis not present

## 2016-08-18 DIAGNOSIS — I10 Essential (primary) hypertension: Secondary | ICD-10-CM | POA: Diagnosis not present

## 2016-08-18 DIAGNOSIS — Z96652 Presence of left artificial knee joint: Secondary | ICD-10-CM | POA: Diagnosis not present

## 2016-08-19 DIAGNOSIS — M1711 Unilateral primary osteoarthritis, right knee: Secondary | ICD-10-CM | POA: Diagnosis not present

## 2016-08-19 DIAGNOSIS — Z96652 Presence of left artificial knee joint: Secondary | ICD-10-CM | POA: Diagnosis not present

## 2016-08-19 DIAGNOSIS — I10 Essential (primary) hypertension: Secondary | ICD-10-CM | POA: Diagnosis not present

## 2016-08-19 DIAGNOSIS — Z471 Aftercare following joint replacement surgery: Secondary | ICD-10-CM | POA: Diagnosis not present

## 2016-08-21 DIAGNOSIS — I10 Essential (primary) hypertension: Secondary | ICD-10-CM | POA: Diagnosis not present

## 2016-08-21 DIAGNOSIS — M1711 Unilateral primary osteoarthritis, right knee: Secondary | ICD-10-CM | POA: Diagnosis not present

## 2016-08-21 DIAGNOSIS — Z471 Aftercare following joint replacement surgery: Secondary | ICD-10-CM | POA: Diagnosis not present

## 2016-08-21 DIAGNOSIS — Z96652 Presence of left artificial knee joint: Secondary | ICD-10-CM | POA: Diagnosis not present

## 2016-08-22 LAB — TYPE AND SCREEN
ABO/RH(D): O NEG
Antibody Screen: NEGATIVE
UNIT DIVISION: 0
UNIT DIVISION: 0
Unit division: 0

## 2016-08-23 DIAGNOSIS — Z471 Aftercare following joint replacement surgery: Secondary | ICD-10-CM | POA: Diagnosis not present

## 2016-08-23 DIAGNOSIS — I10 Essential (primary) hypertension: Secondary | ICD-10-CM | POA: Diagnosis not present

## 2016-08-23 DIAGNOSIS — Z96652 Presence of left artificial knee joint: Secondary | ICD-10-CM | POA: Diagnosis not present

## 2016-08-23 DIAGNOSIS — M1711 Unilateral primary osteoarthritis, right knee: Secondary | ICD-10-CM | POA: Diagnosis not present

## 2016-08-26 DIAGNOSIS — Z96652 Presence of left artificial knee joint: Secondary | ICD-10-CM | POA: Diagnosis not present

## 2016-08-26 DIAGNOSIS — M1711 Unilateral primary osteoarthritis, right knee: Secondary | ICD-10-CM | POA: Diagnosis not present

## 2016-08-26 DIAGNOSIS — I10 Essential (primary) hypertension: Secondary | ICD-10-CM | POA: Diagnosis not present

## 2016-08-26 DIAGNOSIS — Z471 Aftercare following joint replacement surgery: Secondary | ICD-10-CM | POA: Diagnosis not present

## 2016-08-27 ENCOUNTER — Ambulatory Visit (INDEPENDENT_AMBULATORY_CARE_PROVIDER_SITE_OTHER): Payer: Commercial Managed Care - HMO | Admitting: Orthopedic Surgery

## 2016-08-27 DIAGNOSIS — Z4889 Encounter for other specified surgical aftercare: Secondary | ICD-10-CM

## 2016-08-27 DIAGNOSIS — Z96652 Presence of left artificial knee joint: Secondary | ICD-10-CM

## 2016-08-27 MED ORDER — HYDROCODONE-ACETAMINOPHEN 10-325 MG PO TABS: 1.0000 | ORAL_TABLET | ORAL | 0 refills | Status: DC | PRN

## 2016-08-27 NOTE — Progress Notes (Signed)
FOLLOW UP FOR  left TKA  Chief Complaint  Patient presents with  . Follow-up    Recheck left total kne replacement, DOS 08/15/16.    The patient is doing very well. The pain is controlled with Hydrocodone 10  The surgical site is clean dry and intact  We were able to remove the staples today The calf is supple nontender the Homans sign is normal  DVT prevention aspirin 325 mg twice a day with bilateral TED hose  CPM machine at home  Home physical therapy  The therapy notes indicate that her extension was 22 I was able to easily passively extend her full extension.   Assessment and plan: Stable, continue physical therapy return 2-4 weeks Start physical therapy as outpatient when home therapy completed

## 2016-08-28 DIAGNOSIS — Z471 Aftercare following joint replacement surgery: Secondary | ICD-10-CM | POA: Diagnosis not present

## 2016-08-28 DIAGNOSIS — I10 Essential (primary) hypertension: Secondary | ICD-10-CM | POA: Diagnosis not present

## 2016-08-28 DIAGNOSIS — M1711 Unilateral primary osteoarthritis, right knee: Secondary | ICD-10-CM | POA: Diagnosis not present

## 2016-08-28 DIAGNOSIS — Z96652 Presence of left artificial knee joint: Secondary | ICD-10-CM | POA: Diagnosis not present

## 2016-08-30 DIAGNOSIS — I10 Essential (primary) hypertension: Secondary | ICD-10-CM | POA: Diagnosis not present

## 2016-08-30 DIAGNOSIS — Z96652 Presence of left artificial knee joint: Secondary | ICD-10-CM | POA: Diagnosis not present

## 2016-08-30 DIAGNOSIS — Z471 Aftercare following joint replacement surgery: Secondary | ICD-10-CM | POA: Diagnosis not present

## 2016-08-30 DIAGNOSIS — M1711 Unilateral primary osteoarthritis, right knee: Secondary | ICD-10-CM | POA: Diagnosis not present

## 2016-09-12 ENCOUNTER — Ambulatory Visit: Payer: Commercial Managed Care - HMO | Attending: Orthopedic Surgery | Admitting: Physical Therapy

## 2016-09-12 DIAGNOSIS — M25662 Stiffness of left knee, not elsewhere classified: Secondary | ICD-10-CM

## 2016-09-12 DIAGNOSIS — M25562 Pain in left knee: Secondary | ICD-10-CM | POA: Diagnosis not present

## 2016-09-12 NOTE — Therapy (Signed)
[facility] [address] [place], [place], [address] Phone: [phone]   Fax:  [phone]  Physical Therapy Evaluation  Patient Details  Name: Norma Barton MRN: [id number] Date of Birth: 01/28/1935 Referring Provider: Fuller CanadaStanley Harrison MD  Encounter Date: 09/12/2016      PT End of Session - 09/12/16 1742    Visit Number 1   Number of Visits 12   Date for PT Re-Evaluation 11/11/16   PT Start Time 0235   PT Stop Time 0333   PT Time Calculation (min) 58 min   Activity Tolerance Patient tolerated treatment well   Behavior During Therapy [facility] for tasks assessed/performed      Past Medical History:  Diagnosis Date  . Hypertension     Past Surgical History:  Procedure Laterality Date  . ABDOMINAL HYSTERECTOMY    . arthroscopy right knee    . TOTAL KNEE ARTHROPLASTY Left 08/15/2016   Procedure: TOTAL KNEE ARTHROPLASTY;  Surgeon: Vickki HearingStanley E Harrison, MD;  Location: [facility];  Service: Orthopedics;  Laterality: Left;    There were no vitals filed for this visit.       Subjective Assessment - 09/12/16 1744    Subjective The patient presents to OPPT s/p left total knee arthroplasty performed on 08/15/16. She had some hone health and is pleased thus far.  Her pain-level today is a 6/10.  Her pain decreases with medication and increases with walking.   Patient is accompained by: Family member  Son and daughter-in-law.   Pertinent History Right knee OA.   Limitations Walking   How long can you walk comfortably? Short distances.   Patient Stated Goals Get this knee better.   Currently in Pain? Yes   Pain Score 6    Pain Location Knee   Pain Orientation Left   Pain Descriptors / Indicators Aching;Throbbing   Pain Type Surgical pain   Pain Onset More than a month ago   Pain Frequency Constant   Aggravating Factors  See above.   Pain Relieving Factors See above.            [facility] Assessment - 09/12/16 0001      Assessment   Medical Diagnosis Left total knee replacement.   Referring Provider Fuller CanadaStanley Harrison MD   Onset Date/Surgical Date --  08/15/16 (surgery date).     Precautions   Precautions --  No ultrasound.     Restrictions   Weight Bearing Restrictions No     Balance Screen   Has the patient fallen in the past 6 months No   Has the patient had a decrease in activity level because of a fear of falling?  Yes   Is the patient reluctant to leave their home because of a fear of falling?  No     Home Environment   Living Environment Private residence     Prior Function   Level of Independence Independent     Observation/Other Assessments   Observations One small stitch remaining in which a sterile bandaid was placed over it.     Observation/Other Assessments-Edema    Edema Circumferential     Circumferential Edema   Circumferential - Right --  LT 2.5 cms > LT at mid-patellar region.     ROM / Strength   AROM / PROM / Strength AROM;Strength     AROM   Overall AROM Comments -5 degrees of right active knee extension with flexion 90 degrees and 95 degrees passive.  Strength   Overall Strength Comments Left hip and knee 4/5 with left quadriceps atrophy observed.     Palpation   Palpation comment Tender around patient's left anterior right knee and especially in region of adductor tubercle.     Ambulation/Gait   Gait Comments Supervised gait with a small based quad cane on right.                   [facility] Adult PT Treatment/Exercise - 09/12/16 0001      Exercises   Exercises Knee/Hip     Knee/Hip Exercises: Aerobic   Nustep 10 minutes moving forward x 1 to increase flexion.     Modalities   Modalities Acupuncturist Stimulation Location Left knee.   Electrical Stimulation Action IFC   Electrical Stimulation Parameters 1-10 HZ x 15 minutes.   Electrical Stimulation Goals Edema;Pain      Vasopneumatic   Number Minutes Vasopneumatic  15 minutes   Vasopnuematic Location  --  Right knee.   Vasopneumatic Pressure Low                  PT Short Term Goals - 09/12/16 1754      PT SHORT TERM GOAL #1   Title Independent with a HEP.   Time 2   Period Weeks   Status New     PT SHORT TERM GOAL #2   Title Full active left knee extension.   Time 2   Period Weeks   Status New           PT Long Term Goals - 09/12/16 1755      PT LONG TERM GOAL #1   Title Active left knee flexion to 115 degrees+ so the patient can perform functional tasks and do so with pain not > 2-3/10.   Time 8   Period Weeks   Status New     PT LONG TERM GOAL #2   Title Increase right knee strength to a solid 4+/5 to provide good stability for accomplishment of functional activities   Time 8   Period Weeks   Status New     PT LONG TERM GOAL #3   Title Perform a reciprocating stair gait with one railing with pain not > 2-3/10.   Time 8   Period Weeks   Status New               Plan - 09/12/16 1751    Clinical Impression Statement The patient exhibits 90 degrees of active right knee flexion and - 5 degrees of extension with relatively minimal edema.  She is ambulating with a quad cane.     Rehab Potential Excellent   PT Frequency 3x / week   PT Duration 4 weeks   PT Treatment/Interventions ADLs/Self Care Home Management;Cryotherapy;Electrical Stimulation;Therapeutic activities;Therapeutic exercise;Neuromuscular re-education;Patient/family education;Passive range of motion;Manual techniques;Vasopneumatic Device   PT Next Visit Plan Nustep; PROM to left knee; toatl knee replacement progression electrical stimulation and vasopneumatic.   Consulted and Agree with Plan of Care Patient      Patient will benefit from skilled therapeutic intervention in order to improve the following deficits and impairments:  Pain, Decreased activity tolerance, Decreased strength, Decreased  range of motion, Increased edema  Visit Diagnosis: Acute pain of left knee - Plan: PT plan of care cert/re-cert  Stiffness of left knee, not elsewhere classified - Plan: PT plan of care cert/re-cert     Problem List Patient  Active Problem List   Diagnosis Date Noted  . Arthritis of knee, degenerative 08/15/2016  . Arthritis of left knee   . Left knee pain 11/07/2015  . Right knee pain 11/07/2015  . Hypertension 04/12/2013  . Osteoarthritis of both knees 04/12/2013    Cydni Reddoch, [place] MPT 09/12/2016, 6:08 PM  [facility] [address] [place], [place], [address] Phone: [phone]   Fax:  [phone]  Name: Norma Barton MRN: [id number] Date of Birth: 03/14/1935

## 2016-09-17 ENCOUNTER — Ambulatory Visit: Payer: Commercial Managed Care - HMO | Attending: Orthopedic Surgery | Admitting: Physical Therapy

## 2016-09-17 DIAGNOSIS — M25662 Stiffness of left knee, not elsewhere classified: Secondary | ICD-10-CM | POA: Insufficient documentation

## 2016-09-17 DIAGNOSIS — M25562 Pain in left knee: Secondary | ICD-10-CM | POA: Diagnosis not present

## 2016-09-17 NOTE — Therapy (Addendum)
[facility] [place], [place], [address] Phone: [phone]   Fax:  [phone]  Physical Therapy Treatment  Patient Details  Name: Norma Barton MRN: [id number] Date of Birth: 09-08-1935 Referring Provider: Arther Abbott MD  Encounter Date: 09/17/2016      PT End of Session - 09/17/16 1617    Visit Number 2   Number of Visits 12   Date for PT Re-Evaluation 11/11/16   PT Start Time 0407   PT Stop Time 0504   PT Time Calculation (min) 57 min   Activity Tolerance Patient tolerated treatment well   Behavior During Therapy [facility] for tasks assessed/performed      Past Medical History:  Diagnosis Date  . Hypertension     Past Surgical History:  Procedure Laterality Date  . ABDOMINAL HYSTERECTOMY    . arthroscopy right knee    . TOTAL KNEE ARTHROPLASTY Left 08/15/2016   Procedure: TOTAL KNEE ARTHROPLASTY;  Surgeon: Carole Civil, MD;  Location: [facility];  Service: Orthopedics;  Laterality: Left;    There were no vitals filed for this visit.      Subjective Assessment - 09/17/16 1652    Subjective I'm doing okay.   Pain Score 6    Pain Location Knee   Pain Descriptors / Indicators Aching;Throbbing   Pain Type Surgical pain   Pain Onset More than a month ago                         [facility] Adult PT Treatment/Exercise - 09/17/16 0001      Exercises   Exercises Knee/Hip;Ankle     Knee/Hip Exercises: Aerobic   Nustep 15 minutes moving forward x 2 to increase flexion.     Modalities   Modalities Location manager Stimulation Location Left knee.   Electrical Stimulation Action IFC   Electrical Stimulation Parameters 1-10 Hz x 15 minutes.   Electrical Stimulation Goals Edema;Pain     Vasopneumatic   Number Minutes Vasopneumatic  15 minutes   Vasopnuematic Location  --  Left knee.   Vasopneumatic Pressure Low   Vasopneumatic  Temperature  --  50 degrees.     Manual Therapy   Manual Therapy Passive ROM   Passive ROM PROM into left knee flexion in supine x 5 minutes.     Ankle Exercises: Standing   Rocker Board Limitations 3 minutes.                  PT Short Term Goals - 09/12/16 1754      PT SHORT TERM GOAL #1   Title Independent with a HEP.   Time 2   Period Weeks   Status New     PT SHORT TERM GOAL #2   Title Full active left knee extension.   Time 2   Period Weeks   Status New           PT Long Term Goals - 09/12/16 1755      PT LONG TERM GOAL #1   Title Active left knee flexion to 115 degrees+ so the patient can perform functional tasks and do so with pain not > 2-3/10.   Time 8   Period Weeks   Status New     PT LONG TERM GOAL #2   Title Increase right knee strength to a solid 4+/5 to provide good stability for accomplishment of functional activities  Time 8   Period Weeks   Status New     PT LONG TERM GOAL #3   Title Perform a reciprocating stair gait with one railing with pain not > 2-3/10.   Time 8   Period Weeks   Status New             Patient will benefit from skilled therapeutic intervention in order to improve the following deficits and impairments:  Pain, Decreased activity tolerance, Decreased strength, Decreased range of motion, Increased edema  Visit Diagnosis: Acute pain of left knee  Stiffness of left knee, not elsewhere classified     Problem List Patient Active Problem List   Diagnosis Date Noted  . Arthritis of knee, degenerative 08/15/2016  . Arthritis of left knee   . Left knee pain 11/07/2015  . Right knee pain 11/07/2015  . Hypertension 04/12/2013  . Osteoarthritis of both knees 04/12/2013    Fiora Weill, [place] MPT 09/17/2016, 5:11 PM  [facility] [place], [place], [address] Phone: [phone]   Fax:  [phone]  Name: Norma Barton MRN: [id number] Date of  Birth: 1935/09/06   PHYSICAL THERAPY DISCHARGE SUMMARY  Visits from Start of Care: 2.  Current functional level related to goals / functional outcomes: See above.   Remaining deficits: See below.   Education / Equipment:  Plan: Patient agrees to discharge.  Patient goals were not met. Patient is being discharged due to not returning since the last visit.  ?????        [place] Harmoni Lucus MPT

## 2016-09-19 ENCOUNTER — Encounter: Payer: Commercial Managed Care - HMO | Admitting: Physical Therapy

## 2016-09-23 ENCOUNTER — Ambulatory Visit: Payer: Commercial Managed Care - HMO | Admitting: Physical Therapy

## 2016-09-24 ENCOUNTER — Encounter: Payer: Self-pay | Admitting: Orthopedic Surgery

## 2016-09-24 ENCOUNTER — Ambulatory Visit (INDEPENDENT_AMBULATORY_CARE_PROVIDER_SITE_OTHER): Payer: Medicare HMO | Admitting: Orthopedic Surgery

## 2016-09-24 DIAGNOSIS — G8929 Other chronic pain: Secondary | ICD-10-CM

## 2016-09-24 DIAGNOSIS — M1711 Unilateral primary osteoarthritis, right knee: Secondary | ICD-10-CM

## 2016-09-24 DIAGNOSIS — Z4889 Encounter for other specified surgical aftercare: Secondary | ICD-10-CM | POA: Diagnosis not present

## 2016-09-24 DIAGNOSIS — M25561 Pain in right knee: Secondary | ICD-10-CM

## 2016-09-24 MED ORDER — HYDROCODONE-ACETAMINOPHEN 7.5-325 MG PO TABS: 1.0000 | ORAL_TABLET | Freq: Four times a day (QID) | ORAL | 0 refills | Status: DC | PRN

## 2016-09-24 NOTE — Progress Notes (Addendum)
Patient ID: Norma Barton, female   DOB: 07/08/1935, 82 y.o.   MRN: [id number]  Chief Complaint  Patient presents with  . Follow-up    Left TKA, DOS 08/15/16    HPI Norma Barton is a 82 y.o. female.  Postop week #6  She is doing well she is walking with a cane her range of motion is 0-110 passive 5-95 active  Complains of right knee pain No Known Allergies  Current Outpatient Prescriptions  Medication Sig Dispense Refill  . aspirin EC 325 MG EC tablet Take 1 tablet (325 mg total) by mouth daily with breakfast. 30 tablet 0  . chlorthalidone (HYGROTON) 25 MG tablet TAKE 1 TABLET EVERY DAY 30 tablet 4  . HYDROcodone-acetaminophen (NORCO) 10-325 MG tablet Take 1 tablet by mouth every 4 (four) hours as needed. 84 tablet 0  . metoprolol succinate (TOPROL-XL) 25 MG 24 hr tablet TAKE 1 TABLET (25 MG TOTAL) BY MOUTH DAILY. 30 tablet 4  . ranitidine (ZANTAC) 150 MG tablet Take 150 mg by mouth 2 (two) times daily.    Marland Kitchen. tetrahydrozoline-zinc (VISINE-AC) 0.05-0.25 % ophthalmic solution Place 1-2 drops into both eyes 3 (three) times daily as needed (for dry/irritated eyes).     No current facility-administered medications for this visit.     Physical Exam Physical Exam There were no vitals taken for this visit.  Appearance of incision: Incision is clean dry and intact we removed the staples  The calf was supple and the Homans sign was normal, there is minimal peripheral edema  Assessment and plan The patient is doing well and is in good condition  Follow-up will be 6 WEEKS   Patient requests injection right knee  History loss or arthritis right knee with right knee pain  Prior injections gave her good relief.  Appropriate warnings regarding opioids were given today.  2:24 PM Fuller CanadaStanley Mekayla Soman, MD 09/24/2016   [facility] [place] [facility] reviewed Meds ordered this encounter  Medications  . HYDROcodone-acetaminophen (NORCO) 7.5-325 MG tablet    Sig:  Take 1 tablet by mouth every 6 (six) hours as needed for moderate pain.    Dispense:  28 tablet    Refill:  0

## 2016-09-24 NOTE — Patient Instructions (Signed)
What You Need to Know About Prescription Opioid Pain Medicine        Please be advised. You are on a medication which is classified as an "opiod". The CDC the [employer]  has recently advised all providers to advise patient's that these medications have certain risks which include but are not limited to:    drug intolerance  drug addiction  respiratory depression   respiratory failure  Death  Please keep these medications locked away. If you feel that you are becoming addicted to these medicines or you are having difficulties with these medications please alert your provider.   As your provider I will attempt to wean you off of these medications when you're severe acute pain has been taking care of. However, if we cannot wean you off of this medication you will be sent to a pain management center where they can better manage chronic pain   Opioids are powerful medicines that are used to treat moderate to severe pain. Opioids should be taken with the supervision of a trained health care provider. They should be taken for the shortest period of time as possible. This is because opioids can be addictive and the longer you take opioids, the greater your risk of addiction (opioid use disorder). What do opioids do? Opioids help reduce or eliminate pain. When used for short periods of time, they can help you:  Sleep better.  Do better in physical or occupational therapy.  Feel better in the first few days after an injury.  Recover from surgery. What kind of problems can opioids cause? Opioids can cause side effects, such as:  Constipation.  Nausea.  Vomiting.  Drowsiness.  Confusion.  Opioid use disorder.  Breathing difficulties (respiratory depression). Using opioid pain medicines for longer than 3 days increases your risk of these side effects. Taking opioid pain medicine for a long period of time can affect your ability to do daily tasks. It also  puts you at risk for:  Car accidents.  Heart attack.  Overdose, which can sometimes lead to death. What can increase my risk for developing problems while taking opioids? You may be at an especially high risk for problems while taking opioids if you:  Are over the age of 65.  Are pregnant.  Have kidney or liver disease.  Have certain mental health conditions, such as depression or anxiety.  Have a history of substance use disorder.  Have had an opioid overdose in the past. How do I stop taking opioids if I have been taking them for a long time? If you have been taking opioid medicine for more than a few weeks, you may need to slowly stop taking them (taper). Tapering your use of opioids can decrease your chances of experiencing withdrawal symptoms, such as:  Abdominal pain and cramping.  Nausea.  Sweating.  Sleepiness.  Restlessness.  Uncontrollable shaking (tremors).  Cravings for the medicine. Do not attempt to taper your use of opioids on your own. Talk with your health care provider about how to do this. Your health care provider may prescribe a step-down schedule based on how much medicine you are taking and how long you have been taking it. What are the benefits of stopping the use of opioids? By switching from opioid pain medicine to non-opioid pain management options, you will decrease your risk of accidents and injuries associated with long-term opioid use. You will also be able to:  Monitor your pain more accurately and know when to   seek medical care if it is not improving.  Decrease risk to others around you. Having opioids in the home increases the risk for accidental or intentional use or overdose by others. How can I treat pain without opioids? Pain can be managed with many types of alternative treatments. Ask your health care provider to refer you to one or more specialists who can help you manage pain through:  Physical or occupational  therapy.  Counseling (cognitive-behavioral therapy).  Good nutrition.  Biofeedback.  Massage.  Meditation.  Non-opioid medicine.  Following a gentle exercise program. Where can I get support? If you have been taking opioids for a long time, you may benefit from receiving support for quitting from a local support group or counselor. Ask your health care provider for a referral to these resources in your area. When should I seek medical care? Seek medical care right away if you are taking opioids and you experience any of the following:  Difficulty breathing.  Breathing that is more shallow or slower than normal.  A very slow heartbeat (pulse).  Severe confusion.  Unconsciousness.  Sleepiness.  Difficulty waking from sleep.  Slurred speech.  Nausea and vomiting.  Cold, clammy skin.  Blue lips or fingernails.  Limpness.  Abnormally small pupils. If you think that you or someone else may have taken too much of an opioid medicine, get medical help right away. Do not wait to see if the symptoms go away on their own. Call your local emergency services (911 in the [place] or call the hotline of the National Poison Control Center (800-222-1222 in the [place]  Where can I get more information? To learn more about opioid medicines, visit the Centers for Disease Control and Prevention web site Opioid Basics at https://www.cdc.gov/drugoverdos/opioids/index.html. Summary  Opioid medicines can help you manage moderate-to-severe pain for a short period of time.  Taking opioid pain medicine for a long period of time puts you at risk for unintentional accidents, injury, and even death.  If you think that you or someone else may have taken too much of an opioid, get medical help right away. This information is not intended to replace advice given to you by your health care provider. Make sure you discuss any questions you have with your health care provider. Document Released:  09/29/2015 Document Revised: 04/26/2016 Document Reviewed: 04/14/2015 Elsevier Interactive Patient Education  2017 Elsevier Inc.   

## 2016-09-26 ENCOUNTER — Encounter: Payer: Commercial Managed Care - HMO | Admitting: Physical Therapy

## 2016-10-13 ENCOUNTER — Other Ambulatory Visit: Payer: Self-pay | Admitting: Nurse Practitioner

## 2016-10-13 DIAGNOSIS — I10 Essential (primary) hypertension: Secondary | ICD-10-CM

## 2016-10-14 NOTE — Telephone Encounter (Signed)
Last refill without being seen 

## 2016-10-17 ENCOUNTER — Other Ambulatory Visit: Payer: Self-pay | Admitting: Nurse Practitioner

## 2016-10-17 DIAGNOSIS — I10 Essential (primary) hypertension: Secondary | ICD-10-CM

## 2016-11-05 ENCOUNTER — Ambulatory Visit (INDEPENDENT_AMBULATORY_CARE_PROVIDER_SITE_OTHER): Payer: Medicare HMO | Admitting: Orthopedic Surgery

## 2016-11-05 DIAGNOSIS — M25561 Pain in right knee: Secondary | ICD-10-CM | POA: Diagnosis not present

## 2016-11-05 DIAGNOSIS — Z96652 Presence of left artificial knee joint: Secondary | ICD-10-CM | POA: Diagnosis not present

## 2016-11-05 DIAGNOSIS — Z4889 Encounter for other specified surgical aftercare: Secondary | ICD-10-CM | POA: Diagnosis not present

## 2016-11-05 DIAGNOSIS — M1711 Unilateral primary osteoarthritis, right knee: Secondary | ICD-10-CM | POA: Diagnosis not present

## 2016-11-05 DIAGNOSIS — G8929 Other chronic pain: Secondary | ICD-10-CM

## 2016-11-05 MED ORDER — HYDROCODONE-ACETAMINOPHEN 5-325 MG PO TABS: 1.0000 | ORAL_TABLET | Freq: Three times a day (TID) | ORAL | 0 refills | Status: DC | PRN

## 2016-11-05 NOTE — Progress Notes (Signed)
FOLLOW UP FOR  Left  TKA   Patient comes in after left total knee in November  She is actually complaining of right knee pain.  Long-standing right knee pain, prior treatment with injection and analgesics. As dull aching pain medial joint line mild swelling  Left knee The patient is doing very well. The pain is controlled with Tylenol  The surgical site is clean dry and intact  Review of Systems  Constitutional: Negative for chills, fever and weight loss.  Respiratory: Negative for shortness of breath.   Cardiovascular: Negative for chest pain.  Neurological: Negative for tingling.   Physical Exam  Constitutional: She is oriented to person, place, and time. She appears well-developed and well-nourished. No distress.  Cardiovascular: Normal rate and intact distal pulses.   Neurological: She is alert and oriented to person, place, and time. She has normal reflexes. She exhibits normal muscle tone. Coordination normal.  Skin: Skin is warm and dry. No rash noted. She is not diaphoretic. No erythema. No pallor.  Psychiatric: She has a normal mood and affect. Her behavior is normal. Judgment and thought content normal.   She has progressed to a cane.  The right knee is in varus medial joint line is tender she has a small effusion  Assessment and plan: Status post left total knee stable  Right knee arthritis:  Injection right knee joint Meds ordered this encounter  Medications  . HYDROcodone-acetaminophen (NORCO) 5-325 MG tablet    Sig: Take 1 tablet by mouth every 8 (eight) hours as needed for moderate pain.    Dispense:  60 tablet    Refill:  0   Return 1 month consider surgery late spring or early summer

## 2016-11-15 ENCOUNTER — Other Ambulatory Visit: Payer: Self-pay | Admitting: Nurse Practitioner

## 2016-11-15 DIAGNOSIS — I10 Essential (primary) hypertension: Secondary | ICD-10-CM

## 2016-12-03 ENCOUNTER — Encounter: Payer: Self-pay | Admitting: Orthopedic Surgery

## 2016-12-03 ENCOUNTER — Ambulatory Visit (INDEPENDENT_AMBULATORY_CARE_PROVIDER_SITE_OTHER): Payer: Medicare HMO | Admitting: Orthopedic Surgery

## 2016-12-03 DIAGNOSIS — M1711 Unilateral primary osteoarthritis, right knee: Secondary | ICD-10-CM | POA: Diagnosis not present

## 2016-12-03 DIAGNOSIS — M25561 Pain in right knee: Secondary | ICD-10-CM | POA: Diagnosis not present

## 2016-12-03 DIAGNOSIS — G8929 Other chronic pain: Secondary | ICD-10-CM | POA: Diagnosis not present

## 2016-12-03 DIAGNOSIS — Z96652 Presence of left artificial knee joint: Secondary | ICD-10-CM

## 2016-12-03 MED ORDER — HYDROCODONE-ACETAMINOPHEN 5-325 MG PO TABS: 1.0000 | ORAL_TABLET | Freq: Three times a day (TID) | ORAL | 0 refills | Status: DC | PRN

## 2016-12-03 NOTE — Patient Instructions (Signed)

## 2016-12-03 NOTE — Progress Notes (Signed)
Progress Note   Patient ID: Norma Barton, female   DOB: 02/14/1935, 82 y.o.   MRN: [id number]  Chief Complaint  Patient presents with  . Follow-up    RIGHT KNEE Pain    Medical decision-making Diagnosis, Data, Plan (risk)  Encounter Diagnoses  Name Primary?  . Chronic pain of right knee Yes  . Primary osteoarthritis of right knee   . Status post total left knee replacement     Injection right knee for osteoarthritis  Procedure note right knee injection verbal consent was obtained to inject right knee joint  Timeout was completed to confirm the site of injection  The medications used were 40 mg of Depo-Medrol and 1% lidocaine 3 cc  Anesthesia was provided by ethyl chloride and the skin was prepped with alcohol.  After cleaning the skin with alcohol a 20-gauge needle was used to inject the right knee joint. There were no complications. A sterile bandage was applied.   HPI Norma Barton is a 82 y.o. female.   HPI 82 year old female had a left total knee arthroplasty 08/15/2016  Prior to that she was getting injections in both knees for osteoarthritis  She would like her right knee reinjected  Review of Systems Review of Systems She complains of peripatellar pain lateral side left knee otherwise left knee functioning well  Examination There were no vitals taken for this visit.  Gen. appearance the patient's appearance is normal with normal grooming and  hygiene The patient is oriented to person place and time Mood and affect are normal   Ortho Exam Stability tests are normal in the right knee Motor exam 5/5 manual muscle testing , no atrophy  Skin is normal (no rash or erythema)  Range of motion is limited to 115 of total arc of flexion she has pain and tenderness in the lateral and medial joint lines without effusion   Norma CanadaStanley Mortimer Bair, MD 12/03/2016 4:02 PM

## 2016-12-30 ENCOUNTER — Ambulatory Visit: Payer: Medicare HMO | Admitting: Orthopedic Surgery

## 2016-12-31 ENCOUNTER — Ambulatory Visit: Payer: Medicare HMO | Admitting: Orthopedic Surgery

## 2017-01-19 ENCOUNTER — Other Ambulatory Visit: Payer: Self-pay | Admitting: Nurse Practitioner

## 2017-01-19 DIAGNOSIS — I10 Essential (primary) hypertension: Secondary | ICD-10-CM

## 2017-02-17 ENCOUNTER — Other Ambulatory Visit: Payer: Self-pay | Admitting: Nurse Practitioner

## 2017-02-17 DIAGNOSIS — I10 Essential (primary) hypertension: Secondary | ICD-10-CM

## 2017-02-21 ENCOUNTER — Other Ambulatory Visit: Payer: Self-pay

## 2017-02-21 ENCOUNTER — Telehealth: Payer: Self-pay | Admitting: Nurse Practitioner

## 2017-02-21 DIAGNOSIS — I10 Essential (primary) hypertension: Secondary | ICD-10-CM

## 2017-02-21 MED ORDER — METOPROLOL SUCCINATE ER 25 MG PO TB24: ORAL_TABLET | ORAL | 0 refills | Status: DC

## 2017-02-21 NOTE — Telephone Encounter (Signed)
Patient dropped in while in night clinic.  She is out of Metoprolol and does not have a followup appointment until 03/10/2017. Wanted one month supply until she can be seen.  [facility] and patient informed she will not receive any more refills, must be seen on 03/10/2017.

## 2017-03-03 ENCOUNTER — Telehealth: Payer: Self-pay | Admitting: Orthopedic Surgery

## 2017-03-03 NOTE — Telephone Encounter (Signed)
Patient left message on voice mail @ 3:38 on 02/28/17 requesting an injection for her knee.  I returned call and left message for patient to call back for appt   cb [phone]

## 2017-03-04 ENCOUNTER — Ambulatory Visit (INDEPENDENT_AMBULATORY_CARE_PROVIDER_SITE_OTHER): Payer: Medicare HMO | Admitting: Orthopedic Surgery

## 2017-03-04 ENCOUNTER — Encounter: Payer: Self-pay | Admitting: Orthopedic Surgery

## 2017-03-04 DIAGNOSIS — M25561 Pain in right knee: Secondary | ICD-10-CM

## 2017-03-04 DIAGNOSIS — Z96652 Presence of left artificial knee joint: Secondary | ICD-10-CM

## 2017-03-04 DIAGNOSIS — G8929 Other chronic pain: Secondary | ICD-10-CM

## 2017-03-04 MED ORDER — HYDROCODONE-ACETAMINOPHEN 5-325 MG PO TABS: 1.0000 | ORAL_TABLET | Freq: Three times a day (TID) | ORAL | 0 refills | Status: DC | PRN

## 2017-03-04 NOTE — Progress Notes (Signed)
Patient ID: Norma HornerBetty M Barton, female   DOB: 10/15/1934, 82 y.o.   MRN: [id number]  Chief Complaint  Patient presents with  . Follow-up    right knee pain, Recurrent     82 Years old  left total knee in November, has right knee pain gets injections from time to time      Review of Systems  Constitutional: Negative for chills and fever.   There were no vitals taken for this visit. Gen. appearance is normal grooming and hygiene normal Orientation to person place and time normal Mood normal  Ortho Exam Right knee tenderness medial lateral compartment mild swelling flexion greater than 90 mild flexion contracture   A/P  Medical decision-making  Encounter Diagnoses  Name Primary?  . Chronic pain of right knee Yes  . Status post total left knee replacement   . Encounter for chronic pain management      Meds ordered this encounter  Medications  . HYDROcodone-acetaminophen (NORCO) 5-325 MG tablet    Sig: Take 1 tablet by mouth every 8 (eight) hours as needed for moderate pain.    Dispense:  60 tablet    Refill:  0   Procedure note right knee injection verbal consent was obtained to inject right knee joint  Timeout was completed to confirm the site of injection  The medications used were 40 mg of Depo-Medrol and 1% lidocaine 3 cc  Anesthesia was provided by ethyl chloride and the skin was prepped with alcohol.  After cleaning the skin with alcohol a 20-gauge needle was used to inject the right knee joint. There were no complications. A sterile bandage was applied.      Fuller CanadaStanley Tyrie Porzio, MD 03/04/2017 4:16 PM

## 2017-03-10 ENCOUNTER — Ambulatory Visit (INDEPENDENT_AMBULATORY_CARE_PROVIDER_SITE_OTHER): Payer: Medicare HMO

## 2017-03-10 ENCOUNTER — Encounter: Payer: Self-pay | Admitting: Nurse Practitioner

## 2017-03-10 ENCOUNTER — Ambulatory Visit (INDEPENDENT_AMBULATORY_CARE_PROVIDER_SITE_OTHER): Payer: Medicare HMO | Admitting: Nurse Practitioner

## 2017-03-10 VITALS — BP 103/69 | HR 69 | Temp 96.8°F | Ht 62.0 in | Wt 153.0 lb

## 2017-03-10 DIAGNOSIS — Z6827 Body mass index (BMI) 27.0-27.9, adult: Secondary | ICD-10-CM | POA: Diagnosis not present

## 2017-03-10 DIAGNOSIS — I10 Essential (primary) hypertension: Secondary | ICD-10-CM

## 2017-03-10 DIAGNOSIS — Z1231 Encounter for screening mammogram for malignant neoplasm of breast: Secondary | ICD-10-CM

## 2017-03-10 DIAGNOSIS — Z13820 Encounter for screening for osteoporosis: Secondary | ICD-10-CM | POA: Diagnosis not present

## 2017-03-10 DIAGNOSIS — M170 Bilateral primary osteoarthritis of knee: Secondary | ICD-10-CM

## 2017-03-10 DIAGNOSIS — Z780 Asymptomatic menopausal state: Secondary | ICD-10-CM

## 2017-03-10 MED ORDER — CHLORTHALIDONE 25 MG PO TABS: 25.0000 mg | ORAL_TABLET | Freq: Every day | ORAL | 5 refills | Status: DC

## 2017-03-10 MED ORDER — METOPROLOL SUCCINATE ER 25 MG PO TB24: ORAL_TABLET | ORAL | 0 refills | Status: DC

## 2017-03-10 NOTE — Patient Instructions (Signed)

## 2017-03-10 NOTE — Progress Notes (Signed)
Subjective:    Patient ID: Norma Barton, female    DOB: 26-Mar-1935, 82 y.o.   MRN: [id number]  HPI  Norma Barton is here today for follow up of chronic medical problem.  Outpatient Encounter Prescriptions as of 03/10/2017  Medication Sig  . chlorthalidone (HYGROTON) 25 MG tablet TAKE 1 TABLET EVERY DAY  . HYDROcodone-acetaminophen (NORCO) 5-325 MG tablet Take 1 tablet by mouth every 8 (eight) hours as needed for moderate pain.  . metoprolol succinate (TOPROL-XL) 25 MG 24 hr tablet TAKE 1 TABLET (25 MG TOTAL) BY MOUTH DAILY.  . ranitidine (ZANTAC) 150 MG tablet Take 150 mg by mouth 2 (two) times daily.   1. Essential hypertension  No cheat pain, sob or HA- doe snot check blood pressures at home  2. Primary osteoarthritis of both knees  Has chronic bil knee pain- makes it difficult for her to get around- denies any recent falls. She has had left tota knee replacement since last visit and is due  To do right knee before end of year.  3. BMI 27.0-27.9,adult   no recent change in weight    New complaints: None today     Review of Systems  Constitutional: Negative for activity change and appetite change.  HENT: Negative.   Eyes: Negative for pain.  Respiratory: Negative for shortness of breath.   Cardiovascular: Negative for chest pain, palpitations and leg swelling.  Gastrointestinal: Negative for abdominal pain.  Endocrine: Negative for polydipsia.  Genitourinary: Negative.   Skin: Negative for rash.  Neurological: Negative for dizziness, weakness and headaches.  Hematological: Does not bruise/bleed easily.  Psychiatric/Behavioral: Negative.   All other systems reviewed and are negative.      Objective:   Physical Exam  Constitutional: She is oriented to person, place, and time. She appears well-developed and well-nourished.  HENT:  Nose: Nose normal.  Mouth/Throat: Oropharynx is clear and moist.  Eyes: EOM are normal.  Neck: Trachea normal, normal range of  motion and full passive range of motion without pain. Neck supple. No JVD present. Carotid bruit is not present. No thyromegaly present.  Cardiovascular: Normal rate, regular rhythm, normal heart sounds and intact distal pulses.  Exam reveals no gallop and no friction rub.   No murmur heard. Pulmonary/Chest: Effort normal and breath sounds normal.  Abdominal: Soft. Bowel sounds are normal. She exhibits no distension and no mass. There is no tenderness.  Musculoskeletal: Normal range of motion.  Lymphadenopathy:    She has no cervical adenopathy.  Neurological: She is alert and oriented to person, place, and time. She has normal reflexes.  Skin: Skin is warm and dry.  Psychiatric: She has a normal mood and affect. Her behavior is normal. Judgment and thought content normal.      BP 103/69   Pulse 69   Temp (!) 96.8 F (36 C) (Oral)   Ht 5' 2"  (1.575 m)   Wt 153 lb (69.4 kg)   BMI 27.98 kg/m      Assessment & Plan:  1. Essential hypertension   - chlorthalidone (HYGROTON) 25 MG tablet; Take 1 tablet (25 mg total) by mouth daily.  Dispense: 30 tablet; Refill: 5 - metoprolol succinate (TOPROL-XL) 25 MG 24 hr tablet; TAKE 1 TABLET (25 MG TOTAL) BY MOUTH DAILY.  Dispense: 30 tablet; Refill: 0 - CMP14+EGFR - Lipid panel  2. Primary osteoarthritis of both knees Fall prevention Keep follow up appointment with ortho  3. BMI 27.0-27.9,adult Discussed diet and exercise for person with  BMI >25 Will recheck weight in 3-6 months  4. Screening for osteoporosis Weight bearing exercises when can tolerate - DG [facility] DEXA  5. Screening mammogram, encounter for - MM SCREENING BREAST TOMO BILATERAL; Future    Labs pending Health maintenance reviewed Diet and exercise encouraged Continue all meds Follow up  In 6 months [place], FNP

## 2017-03-11 ENCOUNTER — Other Ambulatory Visit: Payer: Self-pay | Admitting: Nurse Practitioner

## 2017-03-11 LAB — CMP14+EGFR
ALBUMIN: 3.9 g/dL (ref 3.5–4.7)
ALT: 31 IU/L (ref 0–32)
AST: 46 IU/L — AB (ref 0–40)
Albumin/Globulin Ratio: 1.4 (ref 1.2–2.2)
Alkaline Phosphatase: 62 IU/L (ref 39–117)
BILIRUBIN TOTAL: 0.6 mg/dL (ref 0.0–1.2)
BUN / CREAT RATIO: 23 (ref 12–28)
BUN: 22 mg/dL (ref 8–27)
CO2: 31 mmol/L — ABNORMAL HIGH (ref 20–29)
Calcium: 10.6 mg/dL — ABNORMAL HIGH (ref 8.7–10.3)
Chloride: 97 mmol/L (ref 96–106)
Creatinine, Ser: 0.95 mg/dL (ref 0.57–1.00)
GFR calc Af Amer: 65 mL/min/{1.73_m2} (ref >59)
GFR calc non Af Amer: 57 mL/min/{1.73_m2} — ABNORMAL LOW (ref >59)
GLUCOSE: 146 mg/dL — AB (ref 65–99)
Globulin, Total: 2.7 g/dL (ref 1.5–4.5)
POTASSIUM: 3.3 mmol/L — AB (ref 3.5–5.2)
Sodium: 145 mmol/L — ABNORMAL HIGH (ref 134–144)
Total Protein: 6.6 g/dL (ref 6.0–8.5)

## 2017-03-11 LAB — LIPID PANEL
CHOL/HDL RATIO: 2.5 ratio (ref 0.0–4.4)
Cholesterol, Total: 131 mg/dL (ref 100–199)
HDL: 53 mg/dL (ref >39)
LDL CALC: 55 mg/dL (ref 0–99)
Triglycerides: 116 mg/dL (ref 0–149)
VLDL CHOLESTEROL CAL: 23 mg/dL (ref 5–40)

## 2017-03-11 MED ORDER — POTASSIUM CHLORIDE ER 10 MEQ PO TBCR
10.0000 meq | EXTENDED_RELEASE_TABLET | Freq: Every day | ORAL | 3 refills | Status: DC
Start: 2017-03-11 — End: 2017-07-14

## 2017-03-18 ENCOUNTER — Telehealth: Payer: Self-pay | Admitting: Nurse Practitioner

## 2017-03-22 ENCOUNTER — Other Ambulatory Visit: Payer: Self-pay | Admitting: Nurse Practitioner

## 2017-03-22 DIAGNOSIS — I10 Essential (primary) hypertension: Secondary | ICD-10-CM

## 2017-07-14 ENCOUNTER — Other Ambulatory Visit: Payer: Self-pay | Admitting: Nurse Practitioner

## 2017-08-21 ENCOUNTER — Ambulatory Visit (INDEPENDENT_AMBULATORY_CARE_PROVIDER_SITE_OTHER): Payer: Medicare HMO | Admitting: *Deleted

## 2017-08-21 DIAGNOSIS — Z23 Encounter for immunization: Secondary | ICD-10-CM | POA: Diagnosis not present

## 2017-09-02 ENCOUNTER — Other Ambulatory Visit: Payer: Self-pay | Admitting: Nurse Practitioner

## 2017-10-18 ENCOUNTER — Other Ambulatory Visit: Payer: Self-pay | Admitting: Family Medicine

## 2017-10-18 ENCOUNTER — Other Ambulatory Visit: Payer: Self-pay | Admitting: Nurse Practitioner

## 2017-10-18 DIAGNOSIS — I10 Essential (primary) hypertension: Secondary | ICD-10-CM

## 2017-10-19 ENCOUNTER — Other Ambulatory Visit: Payer: Self-pay | Admitting: Nurse Practitioner

## 2017-10-20 NOTE — Telephone Encounter (Signed)
Last seen 6/.25/18   MMM

## 2017-10-20 NOTE — Telephone Encounter (Signed)
Last seen 03/12/17

## 2017-10-20 NOTE — Telephone Encounter (Signed)
Last seen 03/10/17  MMM 

## 2017-10-20 NOTE — Telephone Encounter (Signed)
Last refill without being seen 

## 2017-10-21 NOTE — Telephone Encounter (Signed)
Phone has full voice mail. No message left.

## 2017-11-18 ENCOUNTER — Other Ambulatory Visit: Payer: Self-pay | Admitting: Nurse Practitioner

## 2017-11-18 DIAGNOSIS — I10 Essential (primary) hypertension: Secondary | ICD-10-CM

## 2017-11-18 NOTE — Telephone Encounter (Signed)
Last refill without being seen 

## 2017-11-18 NOTE — Telephone Encounter (Signed)
Last seen 03/10/17  MMM 

## 2017-11-19 NOTE — Telephone Encounter (Signed)
error 

## 2017-12-17 ENCOUNTER — Other Ambulatory Visit: Payer: Self-pay | Admitting: Nurse Practitioner

## 2017-12-17 DIAGNOSIS — I10 Essential (primary) hypertension: Secondary | ICD-10-CM

## 2017-12-24 ENCOUNTER — Other Ambulatory Visit: Payer: Self-pay

## 2017-12-24 DIAGNOSIS — I10 Essential (primary) hypertension: Secondary | ICD-10-CM

## 2017-12-24 DIAGNOSIS — Z029 Encounter for administrative examinations, unspecified: Secondary | ICD-10-CM

## 2017-12-24 MED ORDER — METOPROLOL SUCCINATE ER 25 MG PO TB24: 25.0000 mg | ORAL_TABLET | Freq: Every day | ORAL | 0 refills | Status: DC

## 2017-12-24 NOTE — Telephone Encounter (Signed)
Patient presents to office requesting a RF on her BP med. She has an appt this Friday with MMM and is out of meds. [facility]

## 2017-12-25 ENCOUNTER — Other Ambulatory Visit: Payer: Self-pay | Admitting: Nurse Practitioner

## 2017-12-25 DIAGNOSIS — I10 Essential (primary) hypertension: Secondary | ICD-10-CM

## 2017-12-25 NOTE — Telephone Encounter (Signed)
Last refill without being seen 

## 2017-12-25 NOTE — Telephone Encounter (Signed)
Last seen 03/10/17  MMM

## 2018-01-12 ENCOUNTER — Ambulatory Visit (INDEPENDENT_AMBULATORY_CARE_PROVIDER_SITE_OTHER): Payer: Medicare HMO | Admitting: Nurse Practitioner

## 2018-01-12 ENCOUNTER — Encounter: Payer: Self-pay | Admitting: Nurse Practitioner

## 2018-01-12 VITALS — BP 127/81 | HR 94 | Temp 97.8°F | Ht 62.0 in | Wt 164.0 lb

## 2018-01-12 DIAGNOSIS — E876 Hypokalemia: Secondary | ICD-10-CM | POA: Diagnosis not present

## 2018-01-12 DIAGNOSIS — I10 Essential (primary) hypertension: Secondary | ICD-10-CM | POA: Diagnosis not present

## 2018-01-12 DIAGNOSIS — M170 Bilateral primary osteoarthritis of knee: Secondary | ICD-10-CM | POA: Diagnosis not present

## 2018-01-12 DIAGNOSIS — Z6830 Body mass index (BMI) 30.0-30.9, adult: Secondary | ICD-10-CM

## 2018-01-12 DIAGNOSIS — K219 Gastro-esophageal reflux disease without esophagitis: Secondary | ICD-10-CM | POA: Diagnosis not present

## 2018-01-12 MED ORDER — CHLORTHALIDONE 25 MG PO TABS: 25.0000 mg | ORAL_TABLET | Freq: Every day | ORAL | 5 refills | Status: DC

## 2018-01-12 MED ORDER — METOPROLOL SUCCINATE ER 25 MG PO TB24: 25.0000 mg | ORAL_TABLET | Freq: Every day | ORAL | 5 refills | Status: DC

## 2018-01-12 MED ORDER — POTASSIUM CHLORIDE ER 10 MEQ PO TBCR: 10.0000 meq | EXTENDED_RELEASE_TABLET | Freq: Every day | ORAL | 1 refills | Status: DC

## 2018-01-12 MED ORDER — OMEPRAZOLE 40 MG PO CPDR: 40.0000 mg | DELAYED_RELEASE_CAPSULE | Freq: Every day | ORAL | 5 refills | Status: DC

## 2018-01-12 NOTE — Patient Instructions (Signed)
Food Choices for Gastroesophageal Reflux Disease, Adult When you have gastroesophageal reflux disease (GERD), the foods you eat and your eating habits are very important. Choosing the right foods can help ease your discomfort. What guidelines do I need to follow?  Choose fruits, vegetables, whole grains, and low-fat dairy products.  Choose low-fat meat, fish, and poultry.  Limit fats such as oils, salad dressings, butter, nuts, and avocado.  Keep a food diary. This helps you identify foods that cause symptoms.  Avoid foods that cause symptoms. These may be different for everyone.  Eat small meals often instead of 3 large meals a day.  Eat your meals slowly, in a place where you are relaxed.  Limit fried foods.  Cook foods using methods other than frying.  Avoid drinking alcohol.  Avoid drinking large amounts of liquids with your meals.  Avoid bending over or lying down until 2-3 hours after eating. What foods are not recommended? These are some foods and drinks that may make your symptoms worse: Vegetables  Tomatoes. Tomato juice. Tomato and spaghetti sauce. Chili peppers. Onion and garlic. Horseradish. Fruits  Oranges, grapefruit, and lemon (fruit and juice). Meats  High-fat meats, fish, and poultry. This includes hot dogs, ribs, ham, sausage, salami, and bacon. Dairy  Whole milk and chocolate milk. Sour cream. Cream. Butter. Ice cream. Cream cheese. Drinks  Coffee and tea. Bubbly (carbonated) drinks or energy drinks. Condiments  Hot sauce. Barbecue sauce. Sweets/Desserts  Chocolate and cocoa. Donuts. Peppermint and spearmint. Fats and Oils  High-fat foods. This includes French fries and potato chips. Other  Vinegar. Strong spices. This includes black pepper, white pepper, red pepper, cayenne, curry powder, cloves, ginger, and chili powder. The items listed above may not be a complete list of foods and drinks to avoid. Contact your dietitian for more information.    This information is not intended to replace advice given to you by your health care provider. Make sure you discuss any questions you have with your health care provider. Document Released: 03/03/2012 Document Revised: 02/08/2016 Document Reviewed: 07/07/2013 Elsevier Interactive Patient Education  2017 Elsevier Inc.  

## 2018-01-12 NOTE — Progress Notes (Signed)
Subjective:    Patient ID: Norma Barton, female    DOB: [birth year], 83 y.o.   MRN: [id number]  HPI   Norma Barton is here today for follow up of chronic medical problem.  Outpatient Encounter Medications as of 01/12/2018  Medication Sig  . chlorthalidone (HYGROTON) 25 MG tablet TAKE 1 TABLET BY MOUTH EVERY DAY  . KLOR-CON 10 10 MEQ tablet TAKE 1 TABLET BY MOUTH EVERY DAY  . metoprolol succinate (TOPROL-XL) 25 MG 24 hr tablet Take 1 tablet (25 mg total) by mouth daily.  . ranitidine (ZANTAC) 150 MG tablet Take 150 mg by mouth 2 (two) times daily.    1. Essential hypertension  No c/o chest pain, sob or headache. Does not check blood pressure at home. BP Readings from Last 3 Encounters:  01/12/18 127/81  03/10/17 103/69  08/17/16 110/61     2. Primary osteoarthritis of both knees  She needs a total knee replacement on right but does not want to do right now. She had left total knee replacement in November of 2017 and did well.  3. Hypokalemia  denies any lower ext cramping  4. Gastroesophageal reflux disease without esophagitis  Takes zantac but is not helping. Would like rx meds.  5.      BMI          Weight is up 11lbs since last visit  New complaints: None  today  Social history: Lives by herself. She has 5 sons that come to see her often   Review of Systems  Constitutional: Negative for activity change and appetite change.  HENT: Negative.   Eyes: Negative for pain.  Respiratory: Negative for shortness of breath.   Cardiovascular: Negative for chest pain, palpitations and leg swelling.  Gastrointestinal: Negative for abdominal pain.  Endocrine: Negative for polydipsia.  Genitourinary: Negative.   Musculoskeletal: Positive for arthralgias (right knee).  Skin: Negative for rash.  Neurological: Negative for dizziness, weakness and headaches.  Hematological: Does not bruise/bleed easily.  Psychiatric/Behavioral: Negative.   All other systems reviewed and are  negative.      Objective:   Physical Exam  Constitutional: She is oriented to person, place, and time.  HENT:  Head: Normocephalic.  Nose: Nose normal.  Mouth/Throat: Oropharynx is clear and moist.  Eyes: Pupils are equal, round, and reactive to light. EOM are normal.  Neck: Normal range of motion. Neck supple. No JVD present. Carotid bruit is not present.  Cardiovascular: Normal rate, regular rhythm, normal heart sounds and intact distal pulses.  Pulmonary/Chest: Effort normal and breath sounds normal. No respiratory distress. She has no wheezes. She has no rales. She exhibits no tenderness.  Abdominal: Soft. Normal appearance, normal aorta and bowel sounds are normal. She exhibits no distension, no abdominal bruit, no pulsatile midline mass and no mass. There is no splenomegaly or hepatomegaly. There is no tenderness.  Musculoskeletal: She exhibits edema (1+ edema bil lower ext).  Decreased ROM of right knee due to pain on flexion and extension Crepitus felt right knee Mild edema right knee  Lymphadenopathy:    She has no cervical adenopathy.  Neurological: She is alert and oriented to person, place, and time. She has normal reflexes.  Skin: Skin is warm and dry.  Psychiatric: Judgment normal.   BP 127/81   Pulse 94   Temp 97.8 F (36.6 C) (Oral)   Ht  (1.575 m)   Wt 164 lb (74.4 kg)   BMI 30.00 kg/m  Assessment & Plan:  1. Essential hypertension Low sodium diet - chlorthalidone (HYGROTON) 25 MG tablet; Take 1 tablet (25 mg total) by mouth daily.  Dispense: 30 tablet; Refill: 5 - metoprolol succinate (TOPROL-XL) 25 MG 24 hr tablet; Take 1 tablet (25 mg total) by mouth daily.  Dispense: 30 tablet; Refill: 5  2. Primary osteoarthritis of both knees Follow up with ortho when ready  3. Hypokalemia - potassium chloride (KLOR-CON 10) 10 MEQ tablet; Take 1 tablet (10 mEq total) by mouth daily.  Dispense: 30 tablet; Refill: 1  4. Gastroesophageal reflux disease  without esophagitis Avoid spicy foods Do not eat 2 hours prior to bedtime - omeprazole (PRILOSEC) 40 MG capsule; Take 1 capsule (40 mg total) by mouth daily.  Dispense: 30 capsule; Refill: 5  5. BMI 30.0-30.9,adult Discussed diet and exercise for person with BMI >25 Will recheck weight in 3-6 months     Labs pending Health maintenance reviewed Diet and exercise encouraged Continue all meds Follow up  In 6 months   Mary-Margaret Daphine Deutscher, FNP

## 2018-01-12 NOTE — Addendum Note (Signed)
Addended by: Cleda Daub on: 01/12/2018 04:44 PM   Modules accepted: Orders

## 2018-01-13 LAB — CMP14+EGFR
ALBUMIN: 3.9 g/dL (ref 3.5–4.7)
ALK PHOS: 67 IU/L (ref 39–117)
ALT: 59 IU/L — AB (ref 0–32)
AST: 96 IU/L — ABNORMAL HIGH (ref 0–40)
Albumin/Globulin Ratio: 1.4 (ref 1.2–2.2)
BILIRUBIN TOTAL: 0.6 mg/dL (ref 0.0–1.2)
BUN / CREAT RATIO: 14 (ref 12–28)
BUN: 14 mg/dL (ref 8–27)
CHLORIDE: 100 mmol/L (ref 96–106)
CO2: 29 mmol/L (ref 20–29)
Calcium: 10.4 mg/dL — ABNORMAL HIGH (ref 8.7–10.3)
Creatinine, Ser: 0.99 mg/dL (ref 0.57–1.00)
GFR calc non Af Amer: 54 mL/min/{1.73_m2} — ABNORMAL LOW (ref >59)
GFR, EST AFRICAN AMERICAN: 62 mL/min/{1.73_m2} (ref >59)
GLUCOSE: 191 mg/dL — AB (ref 65–99)
Globulin, Total: 2.7 g/dL (ref 1.5–4.5)
Potassium: 3.5 mmol/L (ref 3.5–5.2)
Sodium: 144 mmol/L (ref 134–144)
TOTAL PROTEIN: 6.6 g/dL (ref 6.0–8.5)

## 2018-01-13 LAB — LIPID PANEL
CHOLESTEROL TOTAL: 140 mg/dL (ref 100–199)
Chol/HDL Ratio: 2.7 ratio (ref 0.0–4.4)
HDL: 51 mg/dL (ref >39)
LDL CALC: 62 mg/dL (ref 0–99)
Triglycerides: 135 mg/dL (ref 0–149)
VLDL CHOLESTEROL CAL: 27 mg/dL (ref 5–40)

## 2018-01-21 ENCOUNTER — Ambulatory Visit: Payer: Medicare HMO

## 2018-01-21 ENCOUNTER — Other Ambulatory Visit: Payer: Self-pay | Admitting: Nurse Practitioner

## 2018-02-10 DIAGNOSIS — Z1231 Encounter for screening mammogram for malignant neoplasm of breast: Secondary | ICD-10-CM | POA: Diagnosis not present

## 2018-02-10 LAB — HM MAMMOGRAPHY

## 2018-02-24 ENCOUNTER — Other Ambulatory Visit: Payer: Self-pay | Admitting: Nurse Practitioner

## 2018-02-24 DIAGNOSIS — E876 Hypokalemia: Secondary | ICD-10-CM

## 2018-03-11 DIAGNOSIS — R922 Inconclusive mammogram: Secondary | ICD-10-CM | POA: Diagnosis not present

## 2018-03-11 DIAGNOSIS — N6320 Unspecified lump in the left breast, unspecified quadrant: Secondary | ICD-10-CM | POA: Diagnosis not present

## 2018-03-11 DIAGNOSIS — N6311 Unspecified lump in the right breast, upper outer quadrant: Secondary | ICD-10-CM | POA: Diagnosis not present

## 2018-03-11 DIAGNOSIS — N6001 Solitary cyst of right breast: Secondary | ICD-10-CM | POA: Diagnosis not present

## 2018-03-11 DIAGNOSIS — N6002 Solitary cyst of left breast: Secondary | ICD-10-CM | POA: Diagnosis not present

## 2018-03-11 DIAGNOSIS — R928 Other abnormal and inconclusive findings on diagnostic imaging of breast: Secondary | ICD-10-CM | POA: Diagnosis not present

## 2018-04-20 NOTE — Progress Notes (Signed)
In Care Everywhere Cat 3

## 2018-05-26 DIAGNOSIS — G5791 Unspecified mononeuropathy of right lower limb: Secondary | ICD-10-CM | POA: Diagnosis not present

## 2018-05-26 DIAGNOSIS — M79671 Pain in right foot: Secondary | ICD-10-CM | POA: Diagnosis not present

## 2018-05-30 ENCOUNTER — Other Ambulatory Visit: Payer: Self-pay | Admitting: Nurse Practitioner

## 2018-05-30 DIAGNOSIS — E876 Hypokalemia: Secondary | ICD-10-CM

## 2018-06-01 NOTE — Telephone Encounter (Signed)
Normal potassium 01/12/18 office vsit/labs

## 2018-06-01 NOTE — Telephone Encounter (Signed)
Last refill without being seen 

## 2018-06-02 NOTE — Telephone Encounter (Signed)
[id number]-cb 9/17

## 2018-07-12 ENCOUNTER — Other Ambulatory Visit: Payer: Self-pay | Admitting: Nurse Practitioner

## 2018-07-12 DIAGNOSIS — I10 Essential (primary) hypertension: Secondary | ICD-10-CM

## 2018-07-12 DIAGNOSIS — K219 Gastro-esophageal reflux disease without esophagitis: Secondary | ICD-10-CM

## 2018-07-13 NOTE — Telephone Encounter (Signed)
Last seen 01/12/18  MMM

## 2018-07-14 ENCOUNTER — Encounter: Payer: Self-pay | Admitting: Nurse Practitioner

## 2018-07-14 ENCOUNTER — Ambulatory Visit (INDEPENDENT_AMBULATORY_CARE_PROVIDER_SITE_OTHER): Payer: Medicare HMO | Admitting: Nurse Practitioner

## 2018-07-14 VITALS — BP 124/75 | HR 74 | Temp 97.7°F | Ht 62.0 in | Wt 161.2 lb

## 2018-07-14 DIAGNOSIS — Z23 Encounter for immunization: Secondary | ICD-10-CM

## 2018-07-14 DIAGNOSIS — I10 Essential (primary) hypertension: Secondary | ICD-10-CM | POA: Diagnosis not present

## 2018-07-14 DIAGNOSIS — K219 Gastro-esophageal reflux disease without esophagitis: Secondary | ICD-10-CM

## 2018-07-14 DIAGNOSIS — Z6830 Body mass index (BMI) 30.0-30.9, adult: Secondary | ICD-10-CM

## 2018-07-14 DIAGNOSIS — E876 Hypokalemia: Secondary | ICD-10-CM

## 2018-07-14 MED ORDER — CHLORTHALIDONE 25 MG PO TABS: 25.0000 mg | ORAL_TABLET | Freq: Every day | ORAL | 5 refills | Status: DC

## 2018-07-14 MED ORDER — METOPROLOL SUCCINATE ER 25 MG PO TB24: 25.0000 mg | ORAL_TABLET | Freq: Every day | ORAL | 1 refills | Status: DC

## 2018-07-14 MED ORDER — OMEPRAZOLE 40 MG PO CPDR: 40.0000 mg | DELAYED_RELEASE_CAPSULE | Freq: Every day | ORAL | 1 refills | Status: DC

## 2018-07-14 NOTE — Telephone Encounter (Signed)
LM- last refill without being seen

## 2018-07-14 NOTE — Telephone Encounter (Signed)
Last refill without being seen 

## 2018-07-14 NOTE — Patient Instructions (Signed)

## 2018-07-14 NOTE — Progress Notes (Signed)
Subjective:    Patient ID: Norma Barton, female    DOB: October 22, 1934, 83 y.o.   MRN: [id number]   Chief Complaint: medical management of chronic issues  HPI:  1. Essential hypertension  No c/o chest pain, sob or headache. Does not check blood pressure at home. BP Readings from Last 3 Encounters:  01/12/18 127/81  03/10/17 103/69  08/17/16 110/61     2. Gastroesophageal reflux disease without esophagitis  Patient is on omeprazole daily and works well to keep symptoms under control  3. BMI 30.0-30.9,adult  No weight changes since last visit  4. Hypokalemia  No c/o lower ext cramping. Not taking potassium supplement- says causes diarrhea.    Outpatient Encounter Medications as of 07/14/2018  Medication Sig  . chlorthalidone (HYGROTON) 25 MG tablet Take 1 tablet (25 mg total) by mouth daily.  . metoprolol succinate (TOPROL-XL) 25 MG 24 hr tablet TAKE 1 TABLET BY MOUTH EVERY DAY  . omeprazole (PRILOSEC) 40 MG capsule TAKE 1 CAPSULE BY MOUTH EVERY DAY  . potassium chloride (K-DUR) 10 MEQ tablet TAKE 1 TABLET BY MOUTH EVERY DAY     New complaints: - she was having pain in right foot- saw DR. Drake and he diagnosed her with neuropathy. Started on gabapentin which helps some.  Social history: Lives by herself. 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   Review of Systems  Constitutional: Negative for activity change and appetite change.  HENT: Negative.   Eyes: Negative for pain.  Respiratory: Negative for shortness of breath.   Cardiovascular: Negative for chest pain, palpitations and leg swelling.  Gastrointestinal: Negative for abdominal pain.  Endocrine: Negative for polydipsia.  Genitourinary: Negative.   Skin: Negative for rash.  Neurological: Negative for dizziness, weakness and headaches.  Hematological: Does not bruise/bleed easily.  Psychiatric/Behavioral: Negative.   All other systems reviewed and are negative.      Objective:   Physical Exam  Constitutional:  She is oriented to person, place, and time. She appears well-developed and well-nourished. No distress.  HENT:  Head: Normocephalic.  Nose: Nose normal.  Mouth/Throat: Oropharynx is clear and moist.  Eyes: Pupils are equal, round, and reactive to light. EOM are normal.  Neck: Normal range of motion. Neck supple. No JVD present. Carotid bruit is not present.  Cardiovascular: Normal rate, regular rhythm, normal heart sounds and intact distal pulses.  Pulmonary/Chest: Effort normal and breath sounds normal. No respiratory distress. She has no wheezes. She has no rales. She exhibits no tenderness.  Abdominal: Soft. Normal appearance, normal aorta and bowel sounds are normal. She exhibits no distension, no abdominal bruit, no pulsatile midline mass and no mass. There is no splenomegaly or hepatomegaly. There is no tenderness.  Musculoskeletal: Normal range of motion. She exhibits no edema.  Lymphadenopathy:    She has no cervical adenopathy.  Neurological: She is alert and oriented to person, place, and time. She has normal reflexes.  Skin: Skin is warm and dry.  Erythematous scaley scabbed lesion on left forearm  Psychiatric: She has a normal mood and affect. Her behavior is normal. Judgment and thought content normal.  Nursing note and vitals reviewed.  BP 124/75   Pulse 74   Temp 97.7 F (36.5 C) (Oral)   Ht 5' 2"  (1.575 m)   Wt 161 lb 3.2 oz (73.1 kg)   BMI 29.48 kg/m       Assessment & Plan:  Norma Barton comes in today with chief complaint of Hypertension (6 month followup)  Diagnosis and orders addressed:  1. Essential hypertension Low sodium diet - metoprolol succinate (TOPROL-XL) 25 MG 24 hr tablet; Take 1 tablet (25 mg total) by mouth daily.  Dispense: 90 tablet; Refill: 1 - chlorthalidone (HYGROTON) 25 MG tablet; Take 1 tablet (25 mg total) by mouth daily.  Dispense: 30 tablet; Refill: 5 - CMP14+EGFR - Lipid panel  2. Gastroesophageal reflux disease without  esophagitis Avoid spicy foods Do not eat 2 hours prior to bedtime - omeprazole (PRILOSEC) 40 MG capsule; Take 1 capsule (40 mg total) by mouth daily.  Dispense: 90 capsule; Refill: 1  3. BMI 30.0-30.9,adult Discussed diet and exercise for person with BMI >25 Will recheck weight in 3-6 months  4. Hypokalemia Labs pending   Labs pending Health Maintenance reviewed Diet and exercise encouraged  Follow up plan: 6 months   [name] Done, FNP

## 2018-07-15 LAB — CMP14+EGFR
ALBUMIN: 3.9 g/dL (ref 3.5–4.7)
ALK PHOS: 88 IU/L (ref 39–117)
ALT: 38 IU/L — ABNORMAL HIGH (ref 0–32)
AST: 66 IU/L — ABNORMAL HIGH (ref 0–40)
Albumin/Globulin Ratio: 1.5 (ref 1.2–2.2)
BILIRUBIN TOTAL: 0.6 mg/dL (ref 0.0–1.2)
BUN / CREAT RATIO: 12 (ref 12–28)
BUN: 12 mg/dL (ref 8–27)
CHLORIDE: 97 mmol/L (ref 96–106)
CO2: 30 mmol/L — ABNORMAL HIGH (ref 20–29)
Calcium: 9.5 mg/dL (ref 8.7–10.3)
Creatinine, Ser: 1.01 mg/dL — ABNORMAL HIGH (ref 0.57–1.00)
GFR calc non Af Amer: 52 mL/min/{1.73_m2} — ABNORMAL LOW (ref >59)
GFR, EST AFRICAN AMERICAN: 60 mL/min/{1.73_m2} (ref >59)
GLOBULIN, TOTAL: 2.6 g/dL (ref 1.5–4.5)
Glucose: 134 mg/dL — ABNORMAL HIGH (ref 65–99)
Potassium: 3.2 mmol/L — ABNORMAL LOW (ref 3.5–5.2)
SODIUM: 143 mmol/L (ref 134–144)
TOTAL PROTEIN: 6.5 g/dL (ref 6.0–8.5)

## 2018-07-15 LAB — LIPID PANEL
CHOLESTEROL TOTAL: 138 mg/dL (ref 100–199)
Chol/HDL Ratio: 2.7 ratio (ref 0.0–4.4)
HDL: 52 mg/dL (ref >39)
LDL CALC: 63 mg/dL (ref 0–99)
Triglycerides: 115 mg/dL (ref 0–149)
VLDL CHOLESTEROL CAL: 23 mg/dL (ref 5–40)

## 2018-08-24 ENCOUNTER — Ambulatory Visit: Payer: Medicare HMO | Admitting: Orthopedic Surgery

## 2018-08-26 ENCOUNTER — Ambulatory Visit: Payer: Medicare HMO | Admitting: Orthopedic Surgery

## 2018-08-26 ENCOUNTER — Ambulatory Visit (INDEPENDENT_AMBULATORY_CARE_PROVIDER_SITE_OTHER): Payer: Medicare HMO

## 2018-08-26 ENCOUNTER — Encounter: Payer: Self-pay | Admitting: Orthopedic Surgery

## 2018-08-26 VITALS — BP 120/89 | HR 73 | Ht 62.0 in | Wt 158.0 lb

## 2018-08-26 DIAGNOSIS — G8929 Other chronic pain: Secondary | ICD-10-CM | POA: Diagnosis not present

## 2018-08-26 DIAGNOSIS — M1711 Unilateral primary osteoarthritis, right knee: Secondary | ICD-10-CM | POA: Diagnosis not present

## 2018-08-26 DIAGNOSIS — M25561 Pain in right knee: Secondary | ICD-10-CM | POA: Diagnosis not present

## 2018-08-26 NOTE — Progress Notes (Signed)
Chief Complaint  Patient presents with  . Knee Pain    right / getting worse     83 year old female presents for evaluation of pain and swelling of her right knee.  She reports no trauma.  She reports progressively increasing pain on the medial side of the night knee with deformity progressive varus.  She has a dull aching pain which is preventing her from going shopping.  Pain is constant but worse with activity associated with some swelling but no giving way  Review of Systems  Constitutional: Negative for fever.  Musculoskeletal: Positive for joint pain and myalgias. Negative for back pain.  Skin: Negative.   Neurological: Negative for tingling, tremors, sensory change, speech change and focal weakness.   Past Medical History:  Diagnosis Date  . Hypertension    Past Surgical History:  Procedure Laterality Date  . ABDOMINAL HYSTERECTOMY    . arthroscopy right knee    . TOTAL KNEE ARTHROPLASTY Left 08/15/2016   Procedure: TOTAL KNEE ARTHROPLASTY;  Surgeon: Vickki HearingStanley E Hiawatha Merriott, MD;  Location: [facility];  Service: Orthopedics;  Laterality: Left;    BP 120/89   Pulse 73   Ht 5\' 2"  (1.575 m)   Wt 158 lb (71.7 kg)   BMI 28.90 kg/m   Physical Exam  Constitutional: She is oriented to person, place, and time. She appears well-developed and well-nourished.  Neurological: She is alert and oriented to person, place, and time. Gait abnormal.  Psychiatric: She has a normal mood and affect. Judgment normal.  Vitals reviewed.  Right knee is in varus she has tenderness over the medial joint line she has a small joint effusion.  Her range of motion is 110 degrees with 5 degree flexion contracture.  She has no instability and sagittal or coronal planes muscle tone and strength are normal neurovascular exam is intact skin is warm dry with no erythema  Her left knee is functioning well she has full extension 120 degrees of flexion at stable skin incision looks good muscle tone is normal  neurovascular exam intact  X-ray today shows a severe varus osteoarthritis of the right knee please see report  Patient advised that she will probably need surgery when the knee becomes painful enough but today she is comfortable getting an injection  Procedure note right knee injection verbal consent was obtained to inject right knee joint  Timeout was completed to confirm the site of injection  The medications used were 40 mg of Depo-Medrol and 1% lidocaine 3 cc  Anesthesia was provided by ethyl chloride and the skin was prepped with alcohol.  After cleaning the skin with alcohol a 20-gauge needle was used to inject the right knee joint. There were no complications. A sterile bandage was applied.   Encounter Diagnoses  Name Primary?  . Chronic pain of right knee Yes  . Primary osteoarthritis of right knee

## 2018-11-02 DIAGNOSIS — N6002 Solitary cyst of left breast: Secondary | ICD-10-CM | POA: Diagnosis not present

## 2018-11-11 ENCOUNTER — Telehealth: Payer: Self-pay | Admitting: Nurse Practitioner

## 2018-12-21 ENCOUNTER — Other Ambulatory Visit: Payer: Self-pay

## 2018-12-21 ENCOUNTER — Ambulatory Visit (INDEPENDENT_AMBULATORY_CARE_PROVIDER_SITE_OTHER): Payer: Medicare HMO | Admitting: Nurse Practitioner

## 2018-12-21 ENCOUNTER — Encounter: Payer: Self-pay | Admitting: Nurse Practitioner

## 2018-12-21 DIAGNOSIS — M79675 Pain in left toe(s): Secondary | ICD-10-CM

## 2018-12-21 DIAGNOSIS — M109 Gout, unspecified: Secondary | ICD-10-CM | POA: Diagnosis not present

## 2018-12-21 MED ORDER — COLCHICINE 0.6 MG PO TABS: ORAL_TABLET | ORAL | 0 refills | Status: DC

## 2018-12-21 NOTE — Progress Notes (Signed)
Patient ID: Norma Barton, female   DOB: 11-05-1934, 84 y.o.   MRN: [id number]    Virtual Visit via telephone Note  I connected with Norma Barton on 12/21/18 at 11:45 AM by telephone and verified that I am speaking with the correct person using two identifiers. Norma Barton is currently located at home and no one is currently with her during visit. The provider, Mary-Margaret Daphine Deutscher, FNP is located in their office at time of visit.  I discussed the limitations, risks, security and privacy concerns of performing an evaluation and management service by telephone and the availability of in person appointments. I also discussed with the patient that there may be a patient responsible charge related to this service. The patient expressed understanding and agreed to proceed.   History and Present Illness:   Chief Complaint: Foot Pain   HPI Patient is c/o left great toe pain that started over a week ago. The toe is red a swollen and hurts for bed sheets to even tough it. Walking increases pain. No history or gout     Review of Systems  Constitutional: Negative.   Respiratory: Negative.   Cardiovascular: Negative.   Musculoskeletal:       Left great toe pain  Skin: Negative.   Neurological: Negative.   Psychiatric/Behavioral: Negative.   All other systems reviewed and are negative. \  Observations/Objective: Alert and oriented- answers all questions approrpriately  Assessment and Plan [name] in today with chief complaint of Foot Pain   1. Great toe pain, left  2. Acute gout of left foot, unspecified cause Probable gout soak in warm epsom salt daily Meds ordered this encounter  Medications  . colchicine 0.6 MG tablet    Sig: 2 po at pain onset and may repeat 1 in 1 hour if still hurting    Dispense:  30 tablet    Refill:  0    Order Specific Question:   Supervising Provider    Answer:   Arville Care A [[id number]]     Follow Up Instructions: As  needed    I discussed the assessment and treatment plan with the patient. The patient was provided an opportunity to ask questions and all were answered. The patient agreed with the plan and demonstrated an understanding of the instructions.   The patient was advised to call back or seek an in-person evaluation if the symptoms worsen or if the condition fails to improve as anticipated.  The above assessment and management plan was discussed with the patient. The patient verbalized understanding of and has agreed to the management plan. Patient is aware to call the clinic if symptoms persist or worsen. Patient is aware when to return to the clinic for a follow-up visit. Patient educated on when it is appropriate to go to the emergency department.    I provided 8 minutes of non-face-to-face time during this encounter.    Mary-Margaret Daphine Deutscher, FNP

## 2019-01-14 ENCOUNTER — Encounter: Payer: Self-pay | Admitting: Nurse Practitioner

## 2019-01-14 ENCOUNTER — Ambulatory Visit (INDEPENDENT_AMBULATORY_CARE_PROVIDER_SITE_OTHER): Payer: Medicare HMO | Admitting: Nurse Practitioner

## 2019-01-14 ENCOUNTER — Other Ambulatory Visit: Payer: Self-pay

## 2019-01-14 DIAGNOSIS — I10 Essential (primary) hypertension: Secondary | ICD-10-CM

## 2019-01-14 DIAGNOSIS — E876 Hypokalemia: Secondary | ICD-10-CM | POA: Diagnosis not present

## 2019-01-14 DIAGNOSIS — Z6830 Body mass index (BMI) 30.0-30.9, adult: Secondary | ICD-10-CM

## 2019-01-14 DIAGNOSIS — F5101 Primary insomnia: Secondary | ICD-10-CM | POA: Diagnosis not present

## 2019-01-14 DIAGNOSIS — K219 Gastro-esophageal reflux disease without esophagitis: Secondary | ICD-10-CM

## 2019-01-14 MED ORDER — METOPROLOL SUCCINATE ER 25 MG PO TB24: 25.0000 mg | ORAL_TABLET | Freq: Every day | ORAL | 1 refills | Status: DC

## 2019-01-14 MED ORDER — POTASSIUM CHLORIDE ER 10 MEQ PO TBCR: 10.0000 meq | EXTENDED_RELEASE_TABLET | Freq: Every day | ORAL | 1 refills | Status: DC

## 2019-01-14 MED ORDER — OMEPRAZOLE 40 MG PO CPDR: 40.0000 mg | DELAYED_RELEASE_CAPSULE | Freq: Every day | ORAL | 1 refills | Status: DC

## 2019-01-14 MED ORDER — CHLORTHALIDONE 25 MG PO TABS: 25.0000 mg | ORAL_TABLET | Freq: Every day | ORAL | 1 refills | Status: DC

## 2019-01-14 NOTE — Progress Notes (Signed)
Virtual Visit via telephone Note  I connected with Norma Barton on 01/14/19 at 2:40 PM by telephone and verified that I am speaking with the correct person using two identifiers. Norma Barton is currently located at home and no one is currently with her during visit. The provider, Mary-Margaret Daphine Deutscher, FNP is located in their office at time of visit.  I discussed the limitations, risks, security and privacy concerns of performing an evaluation and management service by telephone and the availability of in person appointments. I also discussed with the patient that there may be a patient responsible charge related to this service. The patient expressed understanding and agreed to proceed.   History and Present Illness:   Chief Complaint: Medical Management of Chronic Issues    HPI:  1. Essential hypertension No c/o chest pain, sob or headache. Does not check blood pressure at home. BP Readings from Last 3 Encounters:  08/26/18 120/89  07/14/18 124/75  01/12/18 127/81     2. Gastroesophageal reflux disease without esophagitis Is on omeprazole daily. Works well to keep symptoms under control  3. Hypokalemia No c/o lower ext carmping  4. Primary insomnia Sleeps good some nights. Says she has her days and nights mixed up. Watches TV late into night and sleeps during the day  5. BMI 30.0-30.9,adult No recent weight changes    Outpatient Encounter Medications as of 01/14/2019  Medication Sig  . chlorthalidone (HYGROTON) 25 MG tablet Take 1 tablet (25 mg total) by mouth daily.  . colchicine 0.6 MG tablet 2 po at pain onset and may repeat 1 in 1 hour if still hurting  . GABAPENTIN, ONCE-DAILY, PO Take by mouth.  . metoprolol succinate (TOPROL-XL) 25 MG 24 hr tablet Take 1 tablet (25 mg total) by mouth daily.  Marland Kitchen omeprazole (PRILOSEC) 40 MG capsule Take 1 capsule (40 mg total) by mouth daily.  . potassium chloride (K-DUR) 10 MEQ tablet TAKE 1 TABLET BY MOUTH EVERY DAY       New complaints: None today  Social history: Lives by herself and family checks on her daily.       Review of Systems  Constitutional: Negative for diaphoresis and weight loss.  Eyes: Negative for blurred vision, double vision and pain.  Respiratory: Negative for shortness of breath.   Cardiovascular: Negative for chest pain, palpitations, orthopnea and leg swelling.  Gastrointestinal: Negative for abdominal pain.  Skin: Negative for rash.  Neurological: Negative for dizziness, sensory change, loss of consciousness, weakness and headaches.  Endo/Heme/Allergies: Negative for polydipsia. Does not bruise/bleed easily.  Psychiatric/Behavioral: Negative for memory loss. The patient does not have insomnia.   All other systems reviewed and are negative.    Observations/Objective: Lives by herself  Assessment and Plan: Alert and oriented- answers all questions appropriately  Follow Up Instructions: Norma Barton comes in today with chief complaint of Medical Management of Chronic Issues   Diagnosis and orders addressed:  1. Essential hypertension Low sodium diet - metoprolol succinate (TOPROL-XL) 25 MG 24 hr tablet; Take 1 tablet (25 mg total) by mouth daily.  Dispense: 90 tablet; Refill: 1 - chlorthalidone (HYGROTON) 25 MG tablet; Take 1 tablet (25 mg total) by mouth daily.  Dispense: 90 tablet; Refill: 1  2. Gastroesophageal reflux disease without esophagitis Avoid spicy foods Do not eat 2 hours prior to bedtime - omeprazole (PRILOSEC) 40 MG capsule; Take 1 capsule (40 mg total) by mouth daily.  Dispense: 90 capsule; Refill: 1  3. Hypokalemia - potassium  chloride (K-DUR) 10 MEQ tablet; Take 1 tablet (10 mEq total) by mouth daily.  Dispense: 90 tablet; Refill: 1  4. Primary insomnia Bedtime routine  5. BMI 30.0-30.9,adult Discussed diet and exercise for person with BMI >25 Will recheck weight in 3-6 months    Previous lab results reveiewed Health  Maintenance reviewed Diet and exercise encouraged  Follow up plan: 3 onths       I discussed the assessment and treatment plan with the patient. The patient was provided an opportunity to ask questions and all were answered. The patient agreed with the plan and demonstrated an understanding of the instructions.   The patient was advised to call back or seek an in-person evaluation if the symptoms worsen or if the condition fails to improve as anticipated.  The above assessment and management plan was discussed with the patient. The patient verbalized understanding of and has agreed to the management plan. Patient is aware to call the clinic if symptoms persist or worsen. Patient is aware when to return to the clinic for a follow-up visit. Patient educated on when it is appropriate to go to the emergency department.    I provided 15 minutes of non-face-to-face time during this encounter.    Mary-Margaret Daphine DeutscherMartin, FNP

## 2019-03-10 ENCOUNTER — Ambulatory Visit (INDEPENDENT_AMBULATORY_CARE_PROVIDER_SITE_OTHER): Payer: Medicare HMO | Admitting: Orthopedic Surgery

## 2019-03-10 ENCOUNTER — Encounter: Payer: Self-pay | Admitting: Orthopedic Surgery

## 2019-03-10 ENCOUNTER — Other Ambulatory Visit: Payer: Self-pay

## 2019-03-10 VITALS — BP 130/85 | HR 76 | Ht 62.0 in | Wt 157.0 lb

## 2019-03-10 DIAGNOSIS — M25561 Pain in right knee: Secondary | ICD-10-CM

## 2019-03-10 DIAGNOSIS — M1711 Unilateral primary osteoarthritis, right knee: Secondary | ICD-10-CM | POA: Diagnosis not present

## 2019-03-10 DIAGNOSIS — G8929 Other chronic pain: Secondary | ICD-10-CM

## 2019-03-10 NOTE — Progress Notes (Signed)
Chief Complaint  Patient presents with  . Knee Pain    right/ wants injection     Procedure note right knee injection   verbal consent was obtained to inject right knee joint  Timeout was completed to confirm the site of injection  The medications used were 40 mg of Depo-Medrol and 1% lidocaine 3 cc  Anesthesia was provided by ethyl chloride and the skin was prepped with alcohol.  After cleaning the skin with alcohol a 20-gauge needle was used to inject the right knee joint. There were no complications. A sterile bandage was applied.   Encounter Diagnoses  Name Primary?  . Primary osteoarthritis of right knee Yes  . Chronic pain of right knee     Fu prn she s considering knee replacement

## 2019-03-10 NOTE — Addendum Note (Signed)
Addended byCandice Camp on: 03/10/2019 02:27 PM   Modules accepted: Orders, SmartSet

## 2019-03-23 ENCOUNTER — Telehealth: Payer: Self-pay | Admitting: Radiology

## 2019-03-23 NOTE — Telephone Encounter (Signed)
-----   Message from Josue Hector sent at 03/23/2019  1:02 PM EDT ----- I spoke with Ms. Dayrit and she stated she had LM at your office to either reschedule or cancel.  When you speak with her, please let me know if you are cancelling her completely or changing the date.  Thanks,  Hoyle Sauer

## 2019-03-23 NOTE — Telephone Encounter (Signed)
Cancelled her surgery / Total knee replacement was sch on July 28th , her family wants her to wait  She states she has gotten a lot better following the injection   To you FYI

## 2019-04-13 ENCOUNTER — Ambulatory Visit: Admit: 2019-04-13 | Payer: Medicare HMO | Admitting: Orthopedic Surgery

## 2019-04-13 SURGERY — ARTHROPLASTY, KNEE, TOTAL
Anesthesia: Choice | Laterality: Right

## 2019-04-27 ENCOUNTER — Telehealth: Payer: Self-pay | Admitting: Nurse Practitioner

## 2019-04-27 NOTE — Chronic Care Management (AMB) (Signed)
°  Chronic Care Management   Outreach Note  04/27/2019 Name: Norma Barton MRN: [id number] DOB: Oct 16, 1934  Referred by: Chevis Pretty, FNP Reason for referral : Chronic Care Management (Initial CCM outreach was unsuccessful. )   An unsuccessful telephone outreach was attempted today. The patient was referred to the case management team by for assistance with chronic care management and care coordination.   Follow Up Plan: A HIPPA compliant phone message was left for the patient providing contact information and requesting a return call.  The care management team will reach out to the patient again over the next 7 days.  If patient returns call to provider office, please advise to call [facility]  [email or web address]   ??[phone]

## 2019-05-03 NOTE — Chronic Care Management (AMB) (Signed)
Chronic Care Management   Note  05/03/2019 Name: MADALINE LEFEBER MRN: [id number] DOB: 1934/10/23  JAELLA WEINERT is a 84 y.o. year old female who is a primary care patient of Chevis Pretty, [place]. I reached out to Kathline Magic by phone today in response to a referral sent by Ms. [place].    Ms. Siebels was given information about Chronic Care Management services today including:  1. CCM service includes personalized support from designated clinical staff supervised by her physician, including individualized plan of care and coordination with other care providers 2. 24/7 contact phone numbers for assistance for urgent and routine care needs. 3. Service will only be billed when office clinical staff spend 20 minutes or more in a month to coordinate care. 4. Only one practitioner may furnish and bill the service in a calendar month. 5. The patient may stop CCM services at any time (effective at the end of the month) by phone call to the office staff. 6. The patient will be responsible for cost sharing (co-pay) of up to 20% of the service fee (after annual deductible is met).  Patient did not agree to enrollment in care management services and does not wish to consider at this time.  Follow up plan: The patient has been provided with contact information for the chronic care management team and has been advised to call with any health related questions or concerns.   [place]  [email or web address]   ??[phone]

## 2019-05-10 ENCOUNTER — Ambulatory Visit (INDEPENDENT_AMBULATORY_CARE_PROVIDER_SITE_OTHER): Payer: Medicare HMO | Admitting: Orthopedic Surgery

## 2019-05-10 ENCOUNTER — Encounter: Payer: Self-pay | Admitting: Orthopedic Surgery

## 2019-05-10 ENCOUNTER — Other Ambulatory Visit: Payer: Self-pay

## 2019-05-10 VITALS — BP 117/75 | HR 72 | Ht 62.0 in | Wt 157.0 lb

## 2019-05-10 DIAGNOSIS — M25561 Pain in right knee: Secondary | ICD-10-CM | POA: Diagnosis not present

## 2019-05-10 DIAGNOSIS — M1711 Unilateral primary osteoarthritis, right knee: Secondary | ICD-10-CM

## 2019-05-10 DIAGNOSIS — G8929 Other chronic pain: Secondary | ICD-10-CM

## 2019-05-10 MED ORDER — TRAMADOL HCL 50 MG PO TABS: 50.0000 mg | ORAL_TABLET | Freq: Four times a day (QID) | ORAL | 5 refills | Status: DC | PRN

## 2019-05-10 NOTE — Progress Notes (Signed)
Progress Note   Patient ID: Norma Barton, female   DOB: 06/09/1935, 84 y.o.   MRN: [id number]   Chief Complaint  Patient presents with  . Knee Pain    wants alternative treatment for right knee/ not ready for knee replacment     Encounter Diagnoses  Name Primary?  . Primary osteoarthritis of right knee Yes  . Chronic pain of right knee     HPI 84 year old female with osteoarthritis right knee contemplating surgery but afraid because of COVID-19.  She would have to go on a nursing home and does not want at risk going to a nursing home with positive cases  Complains of right knee pain  ROS  No other complaints but she does have elevated liver enzymes was told not to take Tylenol  BP 117/75   Pulse 72   Ht 5\' 2"  (1.575 m)   Wt 157 lb (71.2 kg)   BMI 28.72 kg/m   Physical Exam  Well-developed well-nourished awake and alert oriented x3 mood affect normal gait normal without a limp tenderness lateral medial joint line mild swelling of the joint knee flexion 115 degrees knee stable strength normal skin intact Medical decisions:  (Established problem worse, x-ray ,physical therapy, over-the-counter medicines, read outside film or summarize x-ray)  Data  Imaging:   No new imaging patient not getting good relief from Advil or Aleve   Encounter Diagnoses  Name Primary?  . Primary osteoarthritis of right knee Yes  . Chronic pain of right knee     PLAN:   Meds ordered this encounter  Medications  . traMADol (ULTRAM) 50 MG tablet    Sig: Take 1 tablet (50 mg total) by mouth every 6 (six) hours as needed.    Dispense:  60 tablet    Refill:  5   Procedure note right knee injection   verbal consent was obtained to inject right knee joint  Timeout was completed to confirm the site of injection  The medications used were 40 mg of Depo-Medrol and 1% lidocaine 3 cc  Anesthesia was provided by ethyl chloride and the skin was prepped with alcohol.  After cleaning the  skin with alcohol a 20-gauge needle was used to inject the right knee joint. There were no complications. A sterile bandage was applied.     Arther Abbott, MD 05/10/2019 2:49 PM

## 2019-05-10 NOTE — Patient Instructions (Signed)

## 2019-06-03 DIAGNOSIS — N6002 Solitary cyst of left breast: Secondary | ICD-10-CM | POA: Diagnosis not present

## 2019-06-03 DIAGNOSIS — N6012 Diffuse cystic mastopathy of left breast: Secondary | ICD-10-CM | POA: Diagnosis not present

## 2019-06-03 DIAGNOSIS — R922 Inconclusive mammogram: Secondary | ICD-10-CM | POA: Diagnosis not present

## 2019-06-11 ENCOUNTER — Telehealth: Payer: Self-pay

## 2019-06-11 NOTE — Telephone Encounter (Signed)
Error. MPulliam, CMA/RT(R)  

## 2019-07-13 ENCOUNTER — Encounter: Payer: Self-pay | Admitting: Family Medicine

## 2019-07-13 ENCOUNTER — Ambulatory Visit (INDEPENDENT_AMBULATORY_CARE_PROVIDER_SITE_OTHER): Payer: Medicare HMO | Admitting: Family Medicine

## 2019-07-13 DIAGNOSIS — M10071 Idiopathic gout, right ankle and foot: Secondary | ICD-10-CM

## 2019-07-13 MED ORDER — DICLOFENAC SODIUM 75 MG PO TBEC: 75.0000 mg | DELAYED_RELEASE_TABLET | Freq: Two times a day (BID) | ORAL | 0 refills | Status: DC

## 2019-07-13 NOTE — Progress Notes (Signed)
    Subjective:    Patient ID: Norma Barton, female    DOB: Nov 05, 1934, 84 y.o.   MRN: [id number]   HPI: Norma Barton is a 84 y.o. female presenting for feet swelling and pain in big toe of right foot. Can't bend it. Can hardly walk due to pain. Denies fever, diarrhea currently. First occurred in April. Colchicine gave her bad diarrhea. Requests a different med.   Depression screen [facility] 2/9 07/14/2018 01/12/2018 03/10/2017 12/14/2014 02/03/2014  Decreased Interest 0 0 0 0 0  Down, Depressed, Hopeless 0 0 0 0 0  PHQ - 2 Score 0 0 0 0 0     Relevant past medical, surgical, family and social history reviewed and updated as indicated.  Interim medical history since our last visit reviewed. Allergies and medications reviewed and updated.  ROS:  Review of Systems  Constitutional: Negative.   HENT: Negative.   Eyes: Negative for visual disturbance.  Respiratory: Negative for shortness of breath.   Cardiovascular: Negative for chest pain.  Gastrointestinal: Negative for abdominal pain.  Musculoskeletal: Positive for arthralgias.     Social History   Tobacco Use  Smoking Status Never Smoker  Smokeless Tobacco Never Used       Objective:     Wt Readings from Last 3 Encounters:  05/10/19 157 lb (71.2 kg)  03/10/19 157 lb (71.2 kg)  08/26/18 158 lb (71.7 kg)     Exam deferred. Pt. [place] due to COVID 19. Phone visit performed.   Assessment & Plan:   1. Acute idiopathic gout involving toe of right foot     Meds ordered this encounter  Medications  . diclofenac (VOLTAREN) 75 MG EC tablet    Sig: Take 1 tablet (75 mg total) by mouth 2 (two) times daily. For muscle and  Joint pain    Dispense:  60 tablet    Refill:  0    No orders of the defined types were placed in this encounter.     Diagnoses and all orders for this visit:  Acute idiopathic gout involving toe of right foot  Other orders -     diclofenac (VOLTAREN) 75 MG EC tablet; Take 1 tablet (75 mg  total) by mouth 2 (two) times daily. For muscle and  Joint pain    Virtual Visit via telephone Note  I discussed the limitations, risks, security and privacy concerns of performing an evaluation and management service by telephone and the availability of in person appointments. The patient was identified with two identifiers. Pt.expressed understanding and agreed to proceed. Pt. Is at home. Dr. Livia Snellen is in his office.  Follow Up Instructions:   I discussed the assessment and treatment plan with the patient. The patient was provided an opportunity to ask questions and all were answered. The patient agreed with the plan and demonstrated an understanding of the instructions.   The patient was advised to call back or seek an in-person evaluation if the symptoms worsen or if the condition fails to improve as anticipated.   Total minutes including chart review and phone contact time: 12   Follow up plan: Consider folllow up afterpain resolves with Ms. Hassell Done for prophylaxis, uric acid level  Norma Fraise, MD [place]

## 2019-07-22 ENCOUNTER — Other Ambulatory Visit: Payer: Self-pay

## 2019-07-23 ENCOUNTER — Ambulatory Visit (INDEPENDENT_AMBULATORY_CARE_PROVIDER_SITE_OTHER): Payer: Medicare HMO | Admitting: *Deleted

## 2019-07-23 DIAGNOSIS — Z23 Encounter for immunization: Secondary | ICD-10-CM | POA: Diagnosis not present

## 2019-09-02 ENCOUNTER — Other Ambulatory Visit: Payer: Self-pay | Admitting: Nurse Practitioner

## 2019-09-02 DIAGNOSIS — I10 Essential (primary) hypertension: Secondary | ICD-10-CM

## 2019-10-05 ENCOUNTER — Other Ambulatory Visit: Payer: Self-pay | Admitting: Nurse Practitioner

## 2019-10-05 DIAGNOSIS — I10 Essential (primary) hypertension: Secondary | ICD-10-CM

## 2019-10-05 DIAGNOSIS — K219 Gastro-esophageal reflux disease without esophagitis: Secondary | ICD-10-CM

## 2019-10-06 ENCOUNTER — Other Ambulatory Visit: Payer: Self-pay | Admitting: Nurse Practitioner

## 2019-10-06 DIAGNOSIS — I10 Essential (primary) hypertension: Secondary | ICD-10-CM

## 2019-10-06 NOTE — Telephone Encounter (Signed)
MMM NTBS 30 days given 09/02/19

## 2019-10-07 NOTE — Telephone Encounter (Signed)
ntbs 30 day supply given 09/02/19 Last OV 01/14/19

## 2019-10-07 NOTE — Telephone Encounter (Signed)
Lmtcb to schedule appt to be seen for med refill.

## 2019-10-15 ENCOUNTER — Other Ambulatory Visit: Payer: Self-pay

## 2019-10-15 ENCOUNTER — Ambulatory Visit (INDEPENDENT_AMBULATORY_CARE_PROVIDER_SITE_OTHER): Payer: Medicare HMO | Admitting: Nurse Practitioner

## 2019-10-15 ENCOUNTER — Encounter: Payer: Self-pay | Admitting: Nurse Practitioner

## 2019-10-15 VITALS — BP 107/66 | HR 83 | Temp 97.2°F | Resp 20 | Ht 62.0 in | Wt 160.0 lb

## 2019-10-15 DIAGNOSIS — E876 Hypokalemia: Secondary | ICD-10-CM

## 2019-10-15 DIAGNOSIS — K219 Gastro-esophageal reflux disease without esophagitis: Secondary | ICD-10-CM | POA: Diagnosis not present

## 2019-10-15 DIAGNOSIS — Z6830 Body mass index (BMI) 30.0-30.9, adult: Secondary | ICD-10-CM

## 2019-10-15 DIAGNOSIS — R7309 Other abnormal glucose: Secondary | ICD-10-CM | POA: Diagnosis not present

## 2019-10-15 DIAGNOSIS — R739 Hyperglycemia, unspecified: Secondary | ICD-10-CM

## 2019-10-15 DIAGNOSIS — I10 Essential (primary) hypertension: Secondary | ICD-10-CM | POA: Diagnosis not present

## 2019-10-15 DIAGNOSIS — F5101 Primary insomnia: Secondary | ICD-10-CM | POA: Diagnosis not present

## 2019-10-15 MED ORDER — CHLORTHALIDONE 25 MG PO TABS: 25.0000 mg | ORAL_TABLET | Freq: Every day | ORAL | 0 refills | Status: DC

## 2019-10-15 MED ORDER — METOPROLOL SUCCINATE ER 25 MG PO TB24: 25.0000 mg | ORAL_TABLET | Freq: Every day | ORAL | 1 refills | Status: DC

## 2019-10-15 MED ORDER — OMEPRAZOLE 40 MG PO CPDR: 40.0000 mg | DELAYED_RELEASE_CAPSULE | Freq: Every day | ORAL | 1 refills | Status: DC

## 2019-10-15 NOTE — Progress Notes (Signed)
Subjective:    Patient ID: Norma Barton, female    DOB: 05-02-1935, 85 y.o.   MRN: [id number]   Chief Complaint: Medical Management of Chronic Issues    HPI:  1. Essential hypertension No c/o chest pain, sob or headache. Does not check blood pressure at home. BP Readings from Last 3 Encounters:  10/15/19 107/66  05/10/19 117/75  03/10/19 130/85     2. Hypokalemia No c/o lower ext cramping  3. Gastroesophageal reflux disease without esophagitis Is on omeprazole daily and says does well to keep symptoms under control.  4. Primary insomnia Does not sleep well. Thinks she has her days and nights mixed up  5. BMI 30.0-30.9,adult No recent weight changes Wt Readings from Last 3 Encounters:  10/15/19 160 lb (72.6 kg)  05/10/19 157 lb (71.2 kg)  03/10/19 157 lb (71.2 kg)   BMI Readings from Last 3 Encounters:  10/15/19 29.26 kg/m  05/10/19 28.72 kg/m  03/10/19 28.72 kg/m       Outpatient Encounter Medications as of 10/15/2019  Medication Sig  . chlorthalidone (HYGROTON) 25 MG tablet Take 1 tablet (25 mg total) by mouth daily. (Needs to be seen before next refill)  . metoprolol succinate (TOPROL-XL) 25 MG 24 hr tablet TAKE 1 TABLET BY MOUTH EVERY DAY  . omeprazole (PRILOSEC) 40 MG capsule TAKE 1 CAPSULE BY MOUTH EVERY DAY  . traMADol (ULTRAM) 50 MG tablet Take 1 tablet (50 mg total) by mouth every 6 (six) hours as needed.     Past Surgical History:  Procedure Laterality Date  . ABDOMINAL HYSTERECTOMY    . arthroscopy right knee    . TOTAL KNEE ARTHROPLASTY Left 08/15/2016   Procedure: TOTAL KNEE ARTHROPLASTY;  Surgeon: Carole Civil, MD;  Location: [facility];  Service: Orthopedics;  Laterality: Left;    Family History  Problem Relation Age of Onset  . Breast cancer Sister     New complaints: She says her right knee is really bothering her. She had Total knee replacement on left side 3 years ago and needs the other knee done.  Social history: Lives  by herself. Has family that checks on her daily  Controlled substance contract: n/a    Review of Systems  Constitutional: Negative for diaphoresis.  Eyes: Negative for pain.  Respiratory: Negative for shortness of breath.   Cardiovascular: Negative for chest pain, palpitations and leg swelling.  Gastrointestinal: Negative for abdominal pain.  Endocrine: Negative for polydipsia.  Skin: Negative for rash.  Neurological: Negative for dizziness, weakness and headaches.  Hematological: Does not bruise/bleed easily.  All other systems reviewed and are negative.      Objective:   Physical Exam Vitals and nursing note reviewed.  Constitutional:      General: She is not in acute distress.    Appearance: Normal appearance. She is well-developed.  HENT:     Head: Normocephalic.     Nose: Nose normal.  Eyes:     Pupils: Pupils are equal, round, and reactive to light.  Neck:     Vascular: No carotid bruit or JVD.  Cardiovascular:     Rate and Rhythm: Normal rate and regular rhythm.     Heart sounds: Normal heart sounds.  Pulmonary:     Effort: Pulmonary effort is normal. No respiratory distress.     Breath sounds: Normal breath sounds. No wheezing or rales.  Chest:     Chest wall: No tenderness.  Abdominal:     General: Bowel sounds are  normal. There is no distension or abdominal bruit.     Palpations: Abdomen is soft. There is no hepatomegaly, splenomegaly, mass or pulsatile mass.     Tenderness: There is no abdominal tenderness.  Musculoskeletal:        General: Normal range of motion.     Cervical back: Normal range of motion and neck supple.  Lymphadenopathy:     Cervical: No cervical adenopathy.  Skin:    General: Skin is warm and dry.  Neurological:     Mental Status: She is alert and oriented to person, place, and time.     Deep Tendon Reflexes: Reflexes are normal and symmetric.  Psychiatric:        Behavior: Behavior normal.        Thought Content: Thought content  normal.        Judgment: Judgment normal.    BP 107/66   Pulse 83   Temp (!) 97.2 F (36.2 C) (Temporal)   Resp 20   Ht 5' 2"  (1.575 m)   Wt 160 lb (72.6 kg)   SpO2 96%   BMI 29.26 kg/m         Assessment & Plan:  JENNIFFER VESSELS comes in today with chief complaint of Medical Management of Chronic Issues   Diagnosis and orders addressed:  1. Essential hypertension Low sodium diet - metoprolol succinate (TOPROL-XL) 25 MG 24 hr tablet; Take 1 tablet (25 mg total) by mouth daily.  Dispense: 90 tablet; Refill: 1 - chlorthalidone (HYGROTON) 25 MG tablet; Take 1 tablet (25 mg total) by mouth daily. (Needs to be seen before next refill)  Dispense: 30 tablet; Refill: 0 - CMP14+EGFR - Lipid panel - CBC with Differential/Platelet  2. Hypokalemia Labs pending  3. Gastroesophageal reflux disease without esophagitis Avoid spicy foods Do not eat 2 hours prior to bedtime - omeprazole (PRILOSEC) 40 MG capsule; Take 1 capsule (40 mg total) by mouth daily.  Dispense: 90 capsule; Refill: 1  4. Primary insomnia Bedtime routine  5. BMI 30.0-30.9,adult Discussed diet and exercise for person with BMI >25 Will recheck weight in 3-6 months    Labs pending Health Maintenance reviewed Diet and exercise encouraged  Follow up plan: 6 months   [name] Done, FNP

## 2019-10-15 NOTE — Patient Instructions (Signed)

## 2019-10-18 LAB — CMP14+EGFR
ALT: 48 IU/L — ABNORMAL HIGH (ref 0–32)
AST: 93 IU/L — ABNORMAL HIGH (ref 0–40)
Albumin/Globulin Ratio: 1.3 (ref 1.2–2.2)
Albumin: 4 g/dL (ref 3.6–4.6)
Alkaline Phosphatase: 115 IU/L (ref 39–117)
BUN/Creatinine Ratio: 17 (ref 12–28)
BUN: 18 mg/dL (ref 8–27)
Bilirubin Total: 0.6 mg/dL (ref 0.0–1.2)
CO2: 24 mmol/L (ref 20–29)
Calcium: 9.9 mg/dL (ref 8.7–10.3)
Chloride: 99 mmol/L (ref 96–106)
Creatinine, Ser: 1.05 mg/dL — ABNORMAL HIGH (ref 0.57–1.00)
GFR calc Af Amer: 57 mL/min/{1.73_m2} — ABNORMAL LOW (ref >59)
GFR calc non Af Amer: 50 mL/min/{1.73_m2} — ABNORMAL LOW (ref >59)
Globulin, Total: 3 g/dL (ref 1.5–4.5)
Glucose: 208 mg/dL — ABNORMAL HIGH (ref 65–99)
Potassium: 3.6 mmol/L (ref 3.5–5.2)
Sodium: 143 mmol/L (ref 134–144)
Total Protein: 7 g/dL (ref 6.0–8.5)

## 2019-10-18 LAB — CBC WITH DIFFERENTIAL/PLATELET
Basophils Absolute: 0.1 10*3/uL (ref 0.0–0.2)
Basos: 1 %
EOS (ABSOLUTE): 0.2 10*3/uL (ref 0.0–0.4)
Eos: 2 %
Hematocrit: 47.1 % — ABNORMAL HIGH (ref 34.0–46.6)
Hemoglobin: 16.4 g/dL — ABNORMAL HIGH (ref 11.1–15.9)
Immature Grans (Abs): 0 10*3/uL (ref 0.0–0.1)
Immature Granulocytes: 0 %
Lymphocytes Absolute: 3.1 10*3/uL (ref 0.7–3.1)
Lymphs: 39 %
MCH: 33.3 pg — ABNORMAL HIGH (ref 26.6–33.0)
MCHC: 34.8 g/dL (ref 31.5–35.7)
MCV: 96 fL (ref 79–97)
Monocytes Absolute: 0.6 10*3/uL (ref 0.1–0.9)
Monocytes: 7 %
Neutrophils Absolute: 4.1 10*3/uL (ref 1.4–7.0)
Neutrophils: 51 %
Platelets: 176 10*3/uL (ref 150–450)
RBC: 4.92 x10E6/uL (ref 3.77–5.28)
RDW: 11.8 % (ref 11.7–15.4)
WBC: 8 10*3/uL (ref 3.4–10.8)

## 2019-10-18 LAB — LIPID PANEL
Chol/HDL Ratio: 2.8 ratio (ref 0.0–4.4)
Cholesterol, Total: 150 mg/dL (ref 100–199)
HDL: 53 mg/dL (ref >39)
LDL Chol Calc (NIH): 59 mg/dL (ref 0–99)
Triglycerides: 241 mg/dL — ABNORMAL HIGH (ref 0–149)
VLDL Cholesterol Cal: 38 mg/dL (ref 5–40)

## 2019-10-18 NOTE — Addendum Note (Signed)
Addended by: [name] on: 10/18/2019 01:52 PM   Modules accepted: Orders

## 2019-10-19 ENCOUNTER — Encounter: Payer: Self-pay | Admitting: Nurse Practitioner

## 2019-10-19 DIAGNOSIS — E119 Type 2 diabetes mellitus without complications: Secondary | ICD-10-CM | POA: Insufficient documentation

## 2019-10-19 LAB — HGB A1C W/O EAG: Hgb A1c MFr Bld: 6.8 % — ABNORMAL HIGH (ref 4.8–5.6)

## 2019-10-19 LAB — SPECIMEN STATUS REPORT

## 2019-11-06 ENCOUNTER — Other Ambulatory Visit: Payer: Self-pay | Admitting: Nurse Practitioner

## 2019-11-06 DIAGNOSIS — I10 Essential (primary) hypertension: Secondary | ICD-10-CM

## 2019-11-25 ENCOUNTER — Telehealth: Payer: Self-pay | Admitting: Nurse Practitioner

## 2019-11-25 NOTE — Chronic Care Management (AMB) (Signed)
  Chronic Care Management   Note  11/25/2019 Name: Norma Barton MRN: [id number] DOB: 05-15-1935  Norma Barton is a 85 y.o. year old female who is a primary care patient of Chevis Pretty, [place]. I reached out to Norma Barton by phone today in response to a referral sent by Norma Barton.     Norma Barton was given information about Chronic Care Management services today including:  1. CCM service includes personalized support from designated clinical staff supervised by her physician, including individualized plan of care and coordination with other care providers 2. 24/7 contact phone numbers for assistance for urgent and routine care needs. 3. Service will only be billed when office clinical staff spend 20 minutes or more in a month to coordinate care. 4. Only one practitioner may furnish and bill the service in a calendar month. 5. The patient may stop CCM services at any time (effective at the end of the month) by phone call to the office staff. 6. The patient will be responsible for cost sharing (co-pay) of up to 20% of the service fee (after annual deductible is met).  Patient agreed to services and verbal consent obtained.   Follow up plan: Telephone appointment with care management team member scheduled for:03/03/2020.  [place], [place] [address] Direct Dial: [phone] [email or web address]: [email or web address]

## 2019-12-24 ENCOUNTER — Telehealth: Payer: Self-pay | Admitting: Nurse Practitioner

## 2019-12-24 DIAGNOSIS — R928 Other abnormal and inconclusive findings on diagnostic imaging of breast: Secondary | ICD-10-CM | POA: Diagnosis not present

## 2019-12-24 NOTE — Chronic Care Management (AMB) (Signed)
  Care Management   Note  12/24/2019 Name: [name] MRN: [id number] DOB: 1934/11/21  Norma Barton is a 85 y.o. year old female who is a primary care patient of [name], FNP and is actively engaged with the care management team. I reached out to Wonda Horner by phone today to assist with re-scheduling an initial visit with the RN Case Manager  Follow up plan: Unsuccessful telephone outreach attempt made. A HIPPA compliant phone message was left for the patient providing contact information and requesting a return call. The care management team will reach out to the patient again over the next 7 days. If patient returns call to provider office, please advise to call Embedded Care Management Care Guide Gwenevere Ghazi at [phone].  Gwenevere Ghazi  Care Guide, Embedded Care Coordination [facility]  [place], [place] [address] Direct Dial: [phone] [email or web address]

## 2019-12-27 NOTE — Chronic Care Management (AMB) (Signed)
  Care Management   Note  12/27/2019 Name: Norma Barton MRN: [id number] DOB: [birth year]  ANY MCNEICE is a 85 y.o. year old female who is a primary care patient of [name], FNP and is actively engaged with the care management team. I reached out to Wonda Horner by phone today to assist with re-scheduling an initial visit with the RN Case Manager  Follow up plan: Unsuccessful telephone outreach attempt made. A HIPPA compliant phone message was left for the patient providing contact information and requesting a return call. The care management team will reach out to the patient again over the next 7 days. If patient returns call to provider office, please advise to call Embedded Care Management Care Guide Gwenevere Ghazi  at [phone].  Gwenevere Ghazi  Care Guide, Embedded Care Coordination [facility]  [place], [place] [address] Direct Dial: [phone] [email or web address] Website: [email or web address]

## 2019-12-29 NOTE — Chronic Care Management (AMB) (Signed)
  Care Management   Note  12/29/2019 Name: Norma Barton MRN: [id number] DOB: 01/16/1935  [name] is a 85 y.o. year old female who is a primary care patient of [name], FNP and is actively engaged with the care management team. I reached out to Wonda Horner by phone today to assist with re-scheduling an initial visit with the RN Case Manager  Follow up plan: Telephone appointment with care management team member scheduled for:03/02/2020.  Gwenevere Ghazi  Care Guide, Embedded Care Coordination [facility]  [place], [place] [address] Direct Dial: [phone] [email or web address] Website: [email or web address]

## 2020-01-17 ENCOUNTER — Encounter: Payer: Self-pay | Admitting: Nurse Practitioner

## 2020-01-17 ENCOUNTER — Ambulatory Visit (INDEPENDENT_AMBULATORY_CARE_PROVIDER_SITE_OTHER): Payer: Medicare HMO | Admitting: Nurse Practitioner

## 2020-01-17 DIAGNOSIS — E119 Type 2 diabetes mellitus without complications: Secondary | ICD-10-CM

## 2020-01-17 DIAGNOSIS — Z6830 Body mass index (BMI) 30.0-30.9, adult: Secondary | ICD-10-CM

## 2020-01-17 DIAGNOSIS — R69 Illness, unspecified: Secondary | ICD-10-CM | POA: Diagnosis not present

## 2020-01-17 DIAGNOSIS — E876 Hypokalemia: Secondary | ICD-10-CM

## 2020-01-17 DIAGNOSIS — F5101 Primary insomnia: Secondary | ICD-10-CM

## 2020-01-17 DIAGNOSIS — K219 Gastro-esophageal reflux disease without esophagitis: Secondary | ICD-10-CM

## 2020-01-17 DIAGNOSIS — I10 Essential (primary) hypertension: Secondary | ICD-10-CM | POA: Diagnosis not present

## 2020-01-17 NOTE — Progress Notes (Signed)
Virtual Visit via telephone Note Due to COVID-19 pandemic this visit was conducted virtually. This visit type was conducted due to national recommendations for restrictions regarding the COVID-19 Pandemic (e.g. social distancing, sheltering in place) in an effort to limit this patient's exposure and mitigate transmission in our community. All issues noted in this document were discussed and addressed.  A physical exam was not performed with this format.  I connected with Norma Barton on 01/17/20 at 2:55 by telephone and verified that I am speaking with the correct person using two identifiers. Norma Barton is currently located at home and no one is currently with  her during visit. The provider, [name] Done, FNP is located in their office at time of visit.  I discussed the limitations, risks, security and privacy concerns of performing an evaluation and management service by telephone and the availability of in person appointments. I also discussed with the patient that there may be a patient responsible charge related to this service. The patient expressed understanding and agreed to proceed.   History and Present Illness:  Patient ID: Norma Barton, female    DOB: 05-30-1935, 85 y.o.   MRN: [id number]   Chief Complaint: medical management of chronic issue   HPI:  1. Essential hypertension No c/lo chest pain, sob or headache. Does not check blood pressure at home. BP Readings from Last 3 Encounters:  10/15/19 107/66  05/10/19 117/75  03/10/19 130/85     2. Gastroesophageal reflux disease without esophagitis Is on omeprazole daily and works well to keep symptoms under control.  3. Diabetes mellitus without complication (HCC) Fasting blood sugar are not checked on daily basis. She does not watch diet. Lab Results  Component Value Date   HGBA1C 6.8 (H) 10/15/2019    4. Hypokalemia denies any lower lower ext cramping Lab Results  Component Value Date   K 3.6  10/15/2019     5. Primary insomnia says she sleep well most of the time. She stay up late at night and sleeps late in mornings.  6. BMI 30.0-30.9,adult No recent weight changes Wt Readings from Last 3 Encounters:  10/15/19 160 lb (72.6 kg)  05/10/19 157 lb (71.2 kg)  03/10/19 157 lb (71.2 kg)   BMI Readings from Last 3 Encounters:  10/15/19 29.26 kg/m  05/10/19 28.72 kg/m  03/10/19 28.72 kg/m       Outpatient Encounter Medications as of 01/17/2020  Medication Sig  . chlorthalidone (HYGROTON) 25 MG tablet Take 1 tablet (25 mg total) by mouth daily.  . metoprolol succinate (TOPROL-XL) 25 MG 24 hr tablet Take 1 tablet (25 mg total) by mouth daily.  Marland Kitchen omeprazole (PRILOSEC) 40 MG capsule Take 1 capsule (40 mg total) by mouth daily.  . traMADol (ULTRAM) 50 MG tablet Take 1 tablet (50 mg total) by mouth every 6 (six) hours as needed.     Past Surgical History:  Procedure Laterality Date  . ABDOMINAL HYSTERECTOMY    . arthroscopy right knee    . TOTAL KNEE ARTHROPLASTY Left 08/15/2016   Procedure: TOTAL KNEE ARTHROPLASTY;  Surgeon: Carole Civil, MD;  Location: [facility];  Service: Orthopedics;  Laterality: Left;    Family History  Problem Relation Age of Onset  . Breast cancer Sister     New complaints: None today  Social history: Lives by herself. Has family that check on her everyday  Controlled substance contract: n/a    Review of Systems  Constitutional: Negative for diaphoresis.  Eyes:  Negative for pain.  Respiratory: Negative for shortness of breath.   Cardiovascular: Negative for chest pain, palpitations and leg swelling.  Gastrointestinal: Negative for abdominal pain.  Endocrine: Negative for polydipsia.  Musculoskeletal: Positive for arthralgias (bil knees).  Skin: Negative for rash.  Neurological: Negative for dizziness, weakness and headaches.  Hematological: Does not bruise/bleed easily.  All other systems reviewed and are  negative.    Observations/Objective: Alert and oriented- answers all questions appropriately No distress    Assessment and Plan: Norma Barton comes in today with chief complaint of No chief complaint on file.   Diagnosis and orders addressed:  1. Essential hypertension Low odium diet - CBC with Differential/Platelet; Future - CMP14+EGFR; Future - Lipid panel; Future  2. Gastroesophageal reflux disease without esophagitis Avoid spicy foods Do not eat 2 hours prior to bedtime  3. Diabetes mellitus without complication (HCC) Low carb diet - Bayer DCA Hb A1c Waived; Future  4. Hypokalemia Lab pending  5. Primary insomnia Bedtime routine  6. BMI 30.0-30.9,adult Discussed diet and exercise for person with BMI >25 Will recheck weight in 3-6 months   Patient will come in and have lab drawn Health Maintenance reviewed Diet and exercise encouraged     Follow Up Instructions: 3 month    I discussed the assessment and treatment plan with the patient. The patient was provided an opportunity to ask questions and all were answered. The patient agreed with the plan and demonstrated an understanding of the instructions.   The patient was advised to call back or seek an in-person evaluation if the symptoms worsen or if the condition fails to improve as anticipated.  The above assessment and management plan was discussed with the patient. The patient verbalized understanding of and has agreed to the management plan. Patient is aware to call the clinic if symptoms persist or worsen. Patient is aware when to return to the clinic for a follow-up visit. Patient educated on when it is appropriate to go to the emergency department.   Time call ended:  3:07  I provided 12 minutes of non-face-to-face time during this encounter.    [name] Done, FNP

## 2020-01-31 ENCOUNTER — Ambulatory Visit (INDEPENDENT_AMBULATORY_CARE_PROVIDER_SITE_OTHER): Payer: Medicare HMO | Admitting: *Deleted

## 2020-01-31 DIAGNOSIS — Z0000 Encounter for general adult medical examination without abnormal findings: Secondary | ICD-10-CM | POA: Diagnosis not present

## 2020-01-31 NOTE — Progress Notes (Addendum)
MEDICARE ANNUAL WELLNESS VISIT  01/31/2020  Telephone Visit Disclaimer This Medicare AWV was conducted by telephone due to national recommendations for restrictions regarding the COVID-19 Pandemic (e.g. social distancing).  I verified, using two identifiers, that I am speaking with Norma Barton or their authorized healthcare agent. I discussed the limitations, risks, security, and privacy concerns of performing an evaluation and management service by telephone and the potential availability of an in-person appointment in the future. The patient expressed understanding and agreed to proceed.   Subjective:  Norma Barton is a 85 y.o. female patient of Chevis Pretty, [place] who had a Medicare Annual Wellness Visit today via telephone. Norma Barton is retired and lives alone. She is widowed and has 5 grown sons. She reports that she is socially active and does interact with friends/family regularly. She is not physically active and enjoys watching TV.  Patient Care Team: Chevis Pretty, FNP as PCP - General (Nurse Practitioner) Ilean China, RN as Registered Nurse  Advanced Directives 01/31/2020 09/12/2016 08/15/2016 08/12/2016  Does Patient Have a Medical Advance Directive? No No No No  Would patient like information on creating a medical advance directive? No - Patient declined - No - Patient declined Yes (MAU/Ambulatory/Procedural Areas - Information given)    Hospital Utilization Over the Past 12 Months: # of hospitalizations or ER visits: 0 # of surgeries: 0  Review of Systems    Patient reports that her overall health is unchanged compared to last year.  History obtained from the patient and patient chart.  Patient Reported Readings (BP, Pulse, CBG, Weight, etc) none  Pain Assessment Pain : No/denies pain     Current Medications & Allergies (verified) Allergies as of 01/31/2020   No Known Allergies      Medication List        Accurate as of Jan 31, 2020  1:56 PM. If you have any questions, ask your nurse or doctor.          chlorthalidone 25 MG tablet Commonly known as: HYGROTON Take 1 tablet (25 mg total) by mouth daily.   metoprolol succinate 25 MG 24 hr tablet Commonly known as: TOPROL-XL Take 1 tablet (25 mg total) by mouth daily.   omeprazole 40 MG capsule Commonly known as: PRILOSEC Take 1 capsule (40 mg total) by mouth daily.   Vitamin D3 125 MCG (5000 UT) Caps Take by mouth.        History (reviewed): Past Medical History:  Diagnosis Date   Hypertension    Past Surgical History:  Procedure Laterality Date   ABDOMINAL HYSTERECTOMY     arthroscopy right knee     TOTAL KNEE ARTHROPLASTY Left 08/15/2016   Procedure: TOTAL KNEE ARTHROPLASTY;  Surgeon: Carole Civil, MD;  Location: [facility];  Service: Orthopedics;  Laterality: Left;   Family History  Problem Relation Age of Onset   Parkinson's disease Father    Breast cancer Sister    Social History   Socioeconomic History   Marital status: Widowed    Spouse name: Not on file   Number of children: 5   Years of education: 71   Highest education level: 12th grade  Occupational History   Occupation: retired  Tobacco Use   Smoking status: Never Smoker   Smokeless tobacco: Never Used  Substance and Sexual Activity   Alcohol use: No   Drug use: No   Sexual activity: Not Currently  Other Topics Concern   Not on file  Social  History Narrative   Not on file   Social Determinants of Health   Financial Resource Strain:    Difficulty of Paying Living Expenses:   Food Insecurity:    Worried About Programme researcher, broadcasting/film/video in the Last Year:    Barista in the Last Year:   Transportation Needs:    Freight forwarder (Medical):    Lack of Transportation (Non-Medical):   Physical Activity:    Days of Exercise per Week:    Minutes of Exercise per Session:   Stress:    Feeling of Stress :   Social Connections:    Frequency of Communication  with Friends and Family:    Frequency of Social Gatherings with Friends and Family:    Attends Religious Services:    Active Member of Clubs or Organizations:    Attends Banker Meetings:    Marital Status:     Activities of Daily Living In your present state of health, do you have any difficulty performing the following activities: 01/31/2020  Hearing? N  Vision? N  Difficulty concentrating or making decisions? N  Walking or climbing stairs? Y  Comment bad knees  Dressing or bathing? N  Doing errands, shopping? N  Preparing Food and eating ? N  Using the Toilet? N  In the past six months, have you accidently leaked urine? Y  Comment urgency incontinence  Do you have problems with loss of bowel control? N  Managing your Medications? N  Managing your Finances? N  Housekeeping or managing your Housekeeping? N  Some recent data might be hidden    Patient Education/ Literacy How often do you need to have someone help you when you read instructions, pamphlets, or other written materials from your doctor or pharmacy?: 1 - Never What is the last grade level you completed in school?: 12  Exercise Current Exercise Habits: The patient does not participate in regular exercise at present, Exercise limited by: orthopedic condition(s)(knee pain)  Diet Patient reports consuming 2 meals a day and 0 snack(s) a day Patient reports that her primary diet is: Regular Patient reports that she does have regular access to food.   Depression Screen PHQ 2/9 Scores 01/31/2020 10/15/2019 07/14/2018 01/12/2018 03/10/2017 12/14/2014 02/03/2014  PHQ - 2 Score 0 0 0 0 0 0 0     Fall Risk Fall Risk  01/31/2020 10/15/2019 07/14/2018 01/12/2018 03/10/2017  Falls in the past year? 0 0 No No No     Objective:  Norma Barton seemed alert and oriented and she participated appropriately during our telephone visit.  Blood Pressure Weight BMI  BP Readings from Last 3 Encounters:  10/15/19 107/66    05/10/19 117/75  03/10/19 130/85   Wt Readings from Last 3 Encounters:  10/15/19 160 lb (72.6 kg)  05/10/19 157 lb (71.2 kg)  03/10/19 157 lb (71.2 kg)   BMI Readings from Last 1 Encounters:  10/15/19 29.26 kg/m    *Unable to obtain current vital signs, weight, and BMI due to telephone visit type  Hearing/Vision  Norma Barton did not seem to have difficulty with hearing/understanding during the telephone conversation Reports that she has not had a formal eye exam by an eye care professional within the past year Reports that she has not had a formal hearing evaluation within the past year *Unable to fully assess hearing and vision during telephone visit type  Cognitive Function: 6CIT Screen 01/31/2020  What Year? 0 points  What month? 0 points  What time? 0 points  Count back from 20 0 points  Months in reverse 0 points  Repeat phrase 0 points  Total Score 0   (Normal:0-7, Significant for Dysfunction: >8)  Normal Cognitive Function Screening: Yes   Immunization & Health Maintenance Record Immunization History  Administered Date(s) Administered   Fluad Quad(high Dose 65+) 07/23/2019   Influenza, High Dose Seasonal PF 08/21/2017, 07/14/2018   Influenza,inj,Quad PF,6+ Mos 06/25/2016    Health Maintenance  Topic Date Due   FOOT EXAM  Never done   OPHTHALMOLOGY EXAM  Never done   URINE MICROALBUMIN  Never done   DEXA SCAN  03/11/2019   COVID-19 Vaccine (1) 02/16/2020 (Originally 02/22/1951)   TETANUS/TDAP  10/14/2020 (Originally 02/23/1954)   PNA vac Low Risk Adult (1 of 2 - PCV13) 10/14/2020 (Originally 02/22/2002)   HEMOGLOBIN A1C  04/13/2020   INFLUENZA VACCINE  04/16/2020   MAMMOGRAM  06/24/2020       Assessment  This is a routine wellness examination for Norma Barton.  Health Maintenance: Due or Overdue Health Maintenance Due  Topic Date Due   FOOT EXAM  Never done   OPHTHALMOLOGY EXAM  Never done   URINE MICROALBUMIN  Never done   DEXA SCAN  03/11/2019     Norma Barton does not need a referral for Community Assistance: Care Management:   no Social Work:    no Prescription Assistance:  no Nutrition/Diabetes Education:  no   Plan:  Personalized Goals Goals Addressed             This Visit's Progress    Patient Stated       01/31/2020 AWV Goal: Keep All Scheduled Appointments  Over the next year, patient will attend all scheduled appointments with their PCP and any specialists that they see.        Personalized Health Maintenance & Screening Recommendations  Bone densitometry screening, Eye exam  Lung Cancer Screening Recommended: no (Low Dose CT Chest recommended if Age 70-80 years, 30 pack-year currently smoking OR have quit w/in past 15 years) Hepatitis C Screening recommended: no HIV Screening recommended: no  Advanced Directives: Written information was not prepared per patient's request.  Referrals & Orders No orders of the defined types were placed in this encounter.   Follow-up Plan Follow-up with Norma Pierini, FNP as planned Schedule bone density scan and eye exam    I have personally reviewed and noted the following in the patient's chart:   Medical and social history Use of alcohol, tobacco or illicit drugs  Current medications and supplements Functional ability and status Nutritional status Physical activity Advanced directives List of other physicians Hospitalizations, surgeries, and ER visits in previous 12 months Vitals Screenings to include cognitive, depression, and falls Referrals and appointments  In addition, I have reviewed and discussed with Norma Barton certain preventive protocols, quality metrics, and best practice recommendations. A written personalized care plan for preventive services as well as general preventive health recommendations is available and can be mailed to the patient at her request.      Adella Hare, LPN  7/34/2876    I have reviewed and  agree with the above AWV documentation.   Jannifer Rodney, FNP

## 2020-02-21 ENCOUNTER — Other Ambulatory Visit: Payer: Medicare HMO

## 2020-02-21 ENCOUNTER — Other Ambulatory Visit: Payer: Self-pay

## 2020-02-21 DIAGNOSIS — E119 Type 2 diabetes mellitus without complications: Secondary | ICD-10-CM

## 2020-02-21 DIAGNOSIS — I10 Essential (primary) hypertension: Secondary | ICD-10-CM | POA: Diagnosis not present

## 2020-02-21 LAB — BAYER DCA HB A1C WAIVED: HB A1C (BAYER DCA - WAIVED): 6.6 % (ref <7.0)

## 2020-02-22 LAB — CBC WITH DIFFERENTIAL/PLATELET
Basophils Absolute: 0.1 10*3/uL (ref 0.0–0.2)
Basos: 1 %
EOS (ABSOLUTE): 0.2 10*3/uL (ref 0.0–0.4)
Eos: 2 %
Hematocrit: 44.2 % (ref 34.0–46.6)
Hemoglobin: 15.4 g/dL (ref 11.1–15.9)
Immature Grans (Abs): 0 10*3/uL (ref 0.0–0.1)
Immature Granulocytes: 0 %
Lymphocytes Absolute: 3.5 10*3/uL — ABNORMAL HIGH (ref 0.7–3.1)
Lymphs: 54 %
MCH: 33.5 pg — ABNORMAL HIGH (ref 26.6–33.0)
MCHC: 34.8 g/dL (ref 31.5–35.7)
MCV: 96 fL (ref 79–97)
Monocytes Absolute: 0.5 10*3/uL (ref 0.1–0.9)
Monocytes: 8 %
Neutrophils Absolute: 2.3 10*3/uL (ref 1.4–7.0)
Neutrophils: 35 %
Platelets: 142 10*3/uL — ABNORMAL LOW (ref 150–450)
RBC: 4.6 x10E6/uL (ref 3.77–5.28)
RDW: 12.5 % (ref 11.7–15.4)
WBC: 6.5 10*3/uL (ref 3.4–10.8)

## 2020-02-22 LAB — CMP14+EGFR
ALT: 28 IU/L (ref 0–32)
AST: 44 IU/L — ABNORMAL HIGH (ref 0–40)
Albumin/Globulin Ratio: 1.8 (ref 1.2–2.2)
Albumin: 4.1 g/dL (ref 3.6–4.6)
Alkaline Phosphatase: 87 IU/L (ref 48–121)
BUN/Creatinine Ratio: 16 (ref 12–28)
BUN: 21 mg/dL (ref 8–27)
Bilirubin Total: 0.5 mg/dL (ref 0.0–1.2)
CO2: 27 mmol/L (ref 20–29)
Calcium: 9.5 mg/dL (ref 8.7–10.3)
Chloride: 100 mmol/L (ref 96–106)
Creatinine, Ser: 1.33 mg/dL — ABNORMAL HIGH (ref 0.57–1.00)
GFR calc Af Amer: 43 mL/min/{1.73_m2} — ABNORMAL LOW (ref >59)
GFR calc non Af Amer: 37 mL/min/{1.73_m2} — ABNORMAL LOW (ref >59)
Globulin, Total: 2.3 g/dL (ref 1.5–4.5)
Glucose: 130 mg/dL — ABNORMAL HIGH (ref 65–99)
Potassium: 3.1 mmol/L — ABNORMAL LOW (ref 3.5–5.2)
Sodium: 142 mmol/L (ref 134–144)
Total Protein: 6.4 g/dL (ref 6.0–8.5)

## 2020-02-22 LAB — LIPID PANEL
Chol/HDL Ratio: 2.6 ratio (ref 0.0–4.4)
Cholesterol, Total: 143 mg/dL (ref 100–199)
HDL: 56 mg/dL (ref >39)
LDL Chol Calc (NIH): 68 mg/dL (ref 0–99)
Triglycerides: 101 mg/dL (ref 0–149)
VLDL Cholesterol Cal: 19 mg/dL (ref 5–40)

## 2020-02-24 MED ORDER — POTASSIUM CHLORIDE CRYS ER 20 MEQ PO TBCR
20.0000 meq | EXTENDED_RELEASE_TABLET | Freq: Every day | ORAL | 3 refills | Status: DC
Start: 2020-02-24 — End: 2020-04-18

## 2020-03-02 ENCOUNTER — Ambulatory Visit: Payer: Medicare HMO | Admitting: *Deleted

## 2020-03-02 DIAGNOSIS — E119 Type 2 diabetes mellitus without complications: Secondary | ICD-10-CM

## 2020-03-02 DIAGNOSIS — I10 Essential (primary) hypertension: Secondary | ICD-10-CM

## 2020-03-02 NOTE — Chronic Care Management (AMB) (Signed)
  Chronic Care Management   Initial Visit Outreach Note  03/02/2020 Name: Norma Barton MRN: [id number] DOB: 01/11/1935  Referred by: [name], FNP Reason for referral : Chronic Care Management (Initial Visit)   An unsuccessful initial telephone visit was attempted today. The patient was referred to the case management team for assistance with care management and care coordination.   Follow Up Plan: A HIPPA compliant phone message was left for the patient providing contact information and requesting a return call.  The care management team will reach out to the patient again over the next 10 days.   Demetrios Loll, BSN, RN-BC Embedded Chronic Care Manager [facility] Care Management Direct Dial: [phone]

## 2020-03-03 ENCOUNTER — Telehealth: Payer: Medicare HMO

## 2020-03-03 ENCOUNTER — Telehealth: Payer: Self-pay | Admitting: Nurse Practitioner

## 2020-03-03 NOTE — Chronic Care Management (AMB) (Signed)
  Care Management   Note  03/03/2020 Name: TIRA LAFFERTY MRN: [id number] DOB: 06/03/1935  SHANDRIA CLINCH is a 85 y.o. year old female who is a primary care patient of [name], FNP and is actively engaged with the care management team. I reached out to Wonda Horner by phone today to assist with re-scheduling an initial visit with the RN Case Manager.  Follow up plan: Unsuccessful telephone outreach attempt made. A HIPPA compliant phone message was left for the patient providing contact information and requesting a return call. The care management team will reach out to the patient again over the next 7 days. If patient returns call to provider office, please advise to call Embedded Care Management Care Guide Gwenevere Ghazi at [phone].  Gwenevere Ghazi  Care Guide, Embedded Care Coordination [facility]  [place], [place] [address] Direct Dial: [phone] [email or web address]: [email or web address]

## 2020-03-07 ENCOUNTER — Telehealth: Payer: Self-pay | Admitting: *Deleted

## 2020-03-07 DIAGNOSIS — R600 Localized edema: Secondary | ICD-10-CM

## 2020-03-07 DIAGNOSIS — R609 Edema, unspecified: Secondary | ICD-10-CM

## 2020-03-07 NOTE — Telephone Encounter (Signed)
Patient states that she is having bilateral leg and foot swelling and would like to increase fluid pill to 50 mg

## 2020-03-07 NOTE — Progress Notes (Signed)
Norma Barton   I rescheduled this patient with Lorin Picket for a initial intake appointment. Patient was asking if she could take an extra fluid pill because her legs and feet are swollen. I explained I can not assist with this and advised patient to call the office and put back a message to PCP [name], FNP.

## 2020-03-07 NOTE — Telephone Encounter (Signed)
03/07/2020   Patient was contacted by the Embedded CCM Care Guide RE: scheduling an Initial Visit appointment with the CCM team. She asked the Care Guide,Stacey, if she could take an extra fluid pill and Misty Stanley advised her to call her PCP for advice. I have not spoken with patient for her Initial Visit yet. Per chart, patient has not called into the office to discuss her fluid management needs.    Forwarding to [facility] clinical staff for review and to follow-up with patient about her needs.     Demetrios Loll, BSN, RN-BC Embedded Chronic Care Manager [facility] Care Management Direct Dial: [phone]

## 2020-03-07 NOTE — Chronic Care Management (AMB) (Signed)
  Chronic Care Management   Note  03/07/2020 Name: Norma Barton MRN: [id number] DOB: [birth year]  Norma Barton is a 85 y.o. year old female who is a primary care patient of [name], FNP and is actively engaged with the care management team. I reached out to Norma Barton by phone today to assist with re-scheduling an initial visit with the Licensed Clinical Child psychotherapist for intake appointment for Norma Barton.   Follow up plan: Telephone appointment with care management team member scheduled for: 04/05/2020.  Norma Barton  Care Guide, Embedded Care Coordination [facility]  [place], [place] [address] Direct Dial: [phone] [email or web address] Website: [email or web address]

## 2020-03-07 NOTE — Telephone Encounter (Signed)
Pt rc for nurse 

## 2020-03-09 MED ORDER — FUROSEMIDE 20 MG PO TABS: 20.0000 mg | ORAL_TABLET | Freq: Every day | ORAL | 3 refills | Status: DC

## 2020-03-09 NOTE — Addendum Note (Signed)
Addended by: [name] on: 03/09/2020 02:52 PM   Modules accepted: Orders

## 2020-03-09 NOTE — Telephone Encounter (Signed)
Left message to return call 

## 2020-03-09 NOTE — Telephone Encounter (Signed)
chlorathazidone doe snot work to get ridof swelling. Stop chlorthazidone and prescription for lasix sent to pharmacy-keep a check of blood pressure if possible.

## 2020-03-17 NOTE — Telephone Encounter (Signed)
Left message to please call our office. 

## 2020-03-22 NOTE — Telephone Encounter (Signed)
Left message for patient to call.

## 2020-03-22 NOTE — Telephone Encounter (Signed)
Aware of change in medications. 

## 2020-04-05 ENCOUNTER — Ambulatory Visit (INDEPENDENT_AMBULATORY_CARE_PROVIDER_SITE_OTHER): Payer: Medicare HMO | Admitting: Licensed Clinical Social Worker

## 2020-04-05 DIAGNOSIS — M170 Bilateral primary osteoarthritis of knee: Secondary | ICD-10-CM | POA: Diagnosis not present

## 2020-04-05 DIAGNOSIS — E119 Type 2 diabetes mellitus without complications: Secondary | ICD-10-CM | POA: Diagnosis not present

## 2020-04-05 DIAGNOSIS — I10 Essential (primary) hypertension: Secondary | ICD-10-CM | POA: Diagnosis not present

## 2020-04-05 DIAGNOSIS — K219 Gastro-esophageal reflux disease without esophagitis: Secondary | ICD-10-CM

## 2020-04-05 DIAGNOSIS — F5101 Primary insomnia: Secondary | ICD-10-CM

## 2020-04-05 NOTE — Patient Instructions (Addendum)
Licensed Clinical Social Worker Visit Information  Goals we discussed today:  Goals Addressed              This Visit's Progress   .  Client will talk with LCSW in next 4 weeks about health needs of client and managing health needs of client (pt-stated)        CARE PLAN ENTRY   Current Barriers:  . Patient with chronic diagnoses of HTN, OA, GERD, Insomnia, DM . Occasional Edema in legs  Clinical Social Work Clinical Goal(s):  Marland Kitchen LCSW will call client in next 4 weeks to talk with client about health needs of client and managing health needs of client  Interventions: . Talked with client about CCM program support . Talked with client about social work needs of client . Talked with client about social support network (sons are supportive) . Talked with client about mobility issues of client (she uses a cane to help her walk) . Talked with client about appetite of client . Talked with client about vision issues of client (uses reading glasses) . Talked with client about pain issues of client . Talked with client about occasional swelling in her legs . Talked with client about transport needs of client . Talked with client about her managing of Diabetes . Talked with client about relaxation techniques (watches TV) . Talked with Birgit about ADLs completion . Encouraged Kathie Rhodes to call RNCM as needed for nursing support  Patient Self Care Activities:   Attends medical appointments  Patient Self Care Deficits:    Occasional swelling in legs  Initial goal documentation       Materials Provided: No  Follow Up Plan: LCSW to call client in next 4 weeks to talk with her about health needs of client and managing health needs faced  The patient verbalized understanding of instructions provided today and declined a print copy of patient instruction materials.   Kelton Pillar.Jenyfer Trawick MSW, LCSW Licensed Clinical Social Worker [facility] Care  Management [phone]

## 2020-04-05 NOTE — Chronic Care Management (AMB) (Signed)
Chronic Care Management    Clinical Social Work Follow Up Note  04/05/2020 Name: Norma Barton MRN: [id number] DOB: [birth year]  Norma Barton is a 85 y.o. year old female who is a primary care patient of [name], FNP. The CCM team was consulted for assistance with [name] .   Review of patient status, including review of consultants reports, other relevant assessments, and collaboration with appropriate care team members and the patient's provider was performed as part of comprehensive patient evaluation and provision of chronic care management services.    SDOH (Social Determinants of Health) assessments performed: Yes; risk for tobacco use; risk for depression; risk for stress    Chronic Care Management from 04/05/2020 in [facility]  PHQ-9 Total Score 3     GAD 7 : Generalized Anxiety Score 04/05/2020  Nervous, Anxious, on Edge 0  Control/stop worrying 0  Worry too much - different things 0  Trouble relaxing 0  Restless 0  Easily annoyed or irritable 0  Afraid - awful might happen 0  Total GAD 7 Score 0  Anxiety Difficulty Somewhat difficult   SDOH Interventions     Most Recent Value  SDOH Interventions  Depression Interventions/Treatment  --  Gwen Her with Kathie Rhodes about RNCM support and LCSW support]       Outpatient Encounter Medications as of 04/05/2020  Medication Sig  . Cholecalciferol (VITAMIN D3) 125 MCG (5000 UT) CAPS Take by mouth.  . furosemide (LASIX) 20 MG tablet Take 1 tablet (20 mg total) by mouth daily.  . metoprolol succinate (TOPROL-XL) 25 MG 24 hr tablet Take 1 tablet (25 mg total) by mouth daily.  Marland Kitchen omeprazole (PRILOSEC) 40 MG capsule Take 1 capsule (40 mg total) by mouth daily.  . potassium chloride SA (KLOR-CON) 20 MEQ tablet Take 1 tablet (20 mEq total) by mouth daily.   No facility-administered encounter medications on file as of 04/05/2020.    Goals Addressed              This Visit's Progress     .  Client will talk with LCSW in next 4 weeks about health needs of client and managing health needs of client (pt-stated)        CARE PLAN ENTRY   Current Barriers:  . Patient with chronic diagnoses of HTN, OA, GERD, Insomnia, DM . Occasional Edema in legs  Clinical Social Work Clinical Goal(s):  Marland Kitchen LCSW will call client in next 4 weeks to talk with client about health needs of client and managing health needs of client  Interventions: . Talked with client about CCM program support . Talked with client about social work needs of client . Talked with client about social support network (sons are supportive) . Talked with client about mobility issues of client (she uses a cane to help her walk) . Talked with client about appetite of client . Talked with client about vision issues of client (uses reading glasses) . Talked with client about pain issues of client . Talked with client about occasional swelling in her legs . Talked with client about transport needs of client . Talked with client about her managing of Diabetes . Talked with client about relaxation techniques (watches TV) . Talked with Analysa about ADLs completion . Encouraged Kathie Rhodes to call RNCM as needed for nursing support  Patient Self Care Activities:   Attends medical appointments  Patient Self Care Deficits:    Occasional swelling in legs  Initial goal documentation  Follow Up Plan: LCSW to call client in next 4 weeks to talk with her about health needs of client and managing health needs faced  Kelton Pillar.Kalynne Womac MSW, LCSW Licensed Clinical Social Worker [facility] Care Management [phone]

## 2020-04-14 ENCOUNTER — Ambulatory Visit: Payer: Self-pay | Admitting: Nurse Practitioner

## 2020-04-18 ENCOUNTER — Encounter: Payer: Self-pay | Admitting: Nurse Practitioner

## 2020-04-18 ENCOUNTER — Other Ambulatory Visit: Payer: Self-pay

## 2020-04-18 ENCOUNTER — Ambulatory Visit (INDEPENDENT_AMBULATORY_CARE_PROVIDER_SITE_OTHER): Payer: Medicare HMO | Admitting: Nurse Practitioner

## 2020-04-18 VITALS — BP 115/67 | HR 69 | Temp 97.8°F | Resp 20 | Ht 62.0 in | Wt 160.0 lb

## 2020-04-18 DIAGNOSIS — Z0100 Encounter for examination of eyes and vision without abnormal findings: Secondary | ICD-10-CM | POA: Diagnosis not present

## 2020-04-18 DIAGNOSIS — K219 Gastro-esophageal reflux disease without esophagitis: Secondary | ICD-10-CM

## 2020-04-18 DIAGNOSIS — I10 Essential (primary) hypertension: Secondary | ICD-10-CM | POA: Diagnosis not present

## 2020-04-18 DIAGNOSIS — Z6830 Body mass index (BMI) 30.0-30.9, adult: Secondary | ICD-10-CM | POA: Diagnosis not present

## 2020-04-18 DIAGNOSIS — E876 Hypokalemia: Secondary | ICD-10-CM | POA: Diagnosis not present

## 2020-04-18 DIAGNOSIS — F5101 Primary insomnia: Secondary | ICD-10-CM

## 2020-04-18 DIAGNOSIS — R69 Illness, unspecified: Secondary | ICD-10-CM | POA: Diagnosis not present

## 2020-04-18 DIAGNOSIS — R609 Edema, unspecified: Secondary | ICD-10-CM | POA: Diagnosis not present

## 2020-04-18 DIAGNOSIS — R600 Localized edema: Secondary | ICD-10-CM

## 2020-04-18 DIAGNOSIS — E119 Type 2 diabetes mellitus without complications: Secondary | ICD-10-CM

## 2020-04-18 MED ORDER — METOPROLOL SUCCINATE ER 25 MG PO TB24: 25.0000 mg | ORAL_TABLET | Freq: Every day | ORAL | 1 refills | Status: DC

## 2020-04-18 MED ORDER — POTASSIUM CHLORIDE CRYS ER 20 MEQ PO TBCR: 20.0000 meq | EXTENDED_RELEASE_TABLET | Freq: Every day | ORAL | 1 refills | Status: DC

## 2020-04-18 MED ORDER — FUROSEMIDE 20 MG PO TABS: 20.0000 mg | ORAL_TABLET | Freq: Every day | ORAL | 1 refills | Status: DC

## 2020-04-18 MED ORDER — OMEPRAZOLE 40 MG PO CPDR: 40.0000 mg | DELAYED_RELEASE_CAPSULE | Freq: Every day | ORAL | 1 refills | Status: DC

## 2020-04-18 NOTE — Patient Instructions (Signed)

## 2020-04-18 NOTE — Progress Notes (Signed)
Subjective:    Patient ID: Norma Barton, female    DOB: 11-30-1934, 85 y.o.   MRN: [id number]   Chief Complaint: Medical Management of Chronic Issues    HPI:  1. Essential hypertension No c/o chest pain, sob or headache. Does not check blood pressure at home. BP Readings from Last 3 Encounters:  04/18/20 115/67  10/15/19 107/66  05/10/19 117/75      2. Hypokalemia Denies any lower ext cramping Lab Results  Component Value Date   K 3.1 (L) 02/21/2020     3. Diabetes mellitus without complication ([place]) She does not check her blood sugars at home.  Lab Results  Component Value Date   HGBA1C 6.6 02/21/2020     4. Gastroesophageal reflux disease without esophagitis Is on omeprazole daily and is doing well.   5. Primary insomnia She says she does not sleep good at night. She says she sleeps until noon time most days and takes a nap during the day.  6. BMI 30.0-30.9,adult No recent weight changes Wt Readings from Last 3 Encounters:  04/18/20 160 lb (72.6 kg)  10/15/19 160 lb (72.6 kg)  05/10/19 157 lb (71.2 kg)   BMI Readings from Last 3 Encounters:  04/18/20 29.26 kg/m  10/15/19 29.26 kg/m  05/10/19 28.72 kg/m       Outpatient Encounter Medications as of 04/18/2020  Medication Sig   Cholecalciferol (VITAMIN D3) 125 MCG (5000 UT) CAPS Take by mouth.   furosemide (LASIX) 20 MG tablet Take 1 tablet (20 mg total) by mouth daily.   metoprolol succinate (TOPROL-XL) 25 MG 24 hr tablet Take 1 tablet (25 mg total) by mouth daily.   omeprazole (PRILOSEC) 40 MG capsule Take 1 capsule (40 mg total) by mouth daily.   potassium chloride SA (KLOR-CON) 20 MEQ tablet Take 1 tablet (20 mEq total) by mouth daily.     Past Surgical History:  Procedure Laterality Date   ABDOMINAL HYSTERECTOMY     arthroscopy right knee     TOTAL KNEE ARTHROPLASTY Left 08/15/2016   Procedure: TOTAL KNEE ARTHROPLASTY;  Surgeon: Carole Civil, MD;  Location: [facility];   Service: Orthopedics;  Laterality: Left;    Family History  Problem Relation Age of Onset   Parkinson's disease Father    Breast cancer Sister     New complaints: None today  Social history: Lives by herself  Controlled substance contract: n/a    Review of Systems     Objective:   Physical Exam Vitals and nursing note reviewed.  Constitutional:      General: She is not in acute distress.    Appearance: Normal appearance. She is well-developed.  HENT:     Head: Normocephalic.     Nose: Nose normal.  Eyes:     Pupils: Pupils are equal, round, and reactive to light.  Neck:     Vascular: No carotid bruit or JVD.  Cardiovascular:     Rate and Rhythm: Normal rate and regular rhythm.     Heart sounds: Normal heart sounds.  Pulmonary:     Effort: Pulmonary effort is normal. No respiratory distress.     Breath sounds: Normal breath sounds. No wheezing or rales.  Chest:     Chest wall: No tenderness.  Abdominal:     General: Bowel sounds are normal. There is no distension or abdominal bruit.     Palpations: Abdomen is soft. There is no hepatomegaly, splenomegaly, mass or pulsatile mass.     Tenderness:  There is no abdominal tenderness.  Musculoskeletal:        General: Normal range of motion.     Cervical back: Normal range of motion and neck supple.     Right lower leg: Edema (1+) present.     Left lower leg: Edema (1+) present.  Lymphadenopathy:     Cervical: No cervical adenopathy.  Skin:    General: Skin is warm and dry.  Neurological:     Mental Status: She is alert and oriented to person, place, and time.     Deep Tendon Reflexes: Reflexes are normal and symmetric.  Psychiatric:        Behavior: Behavior normal.        Thought Content: Thought content normal.        Judgment: Judgment normal.    BP 115/67    Pulse 69    Temp 97.8 F (36.6 C) (Temporal)    Resp 20    Ht _0  (1.575 m)    Wt 160 lb (72.6 kg)    SpO2 94%    BMI 29.26 kg/m           Assessment & Plan:  Norma Barton comes in today with chief complaint of Medical Management of Chronic Issues   Diagnosis and orders addressed:  1. Essential hypertension Low sodium diet - metoprolol succinate (TOPROL-XL) 25 MG 24 hr tablet; Take 1 tablet (25 mg total) by mouth daily.  Dispense: 90 tablet; Refill: 1 - CBC with Differential/Platelet - CMP14+EGFR - Lipid panel  2. Hypokalemia  3. Diabetes mellitus without complication ([place]) Continue to watch carbs in diet - Bayer DCA Hb A1c Waived  4. Gastroesophageal reflux disease without esophagitis Avoid spicy foods Do not eat 2 hours prior to bedtime - omeprazole (PRILOSEC) 40 MG capsule; Take 1 capsule (40 mg total) by mouth daily.  Dispense: 90 capsule; Refill: 1  5. Primary insomnia Bedtime routine  6. BMI 30.0-30.9,adult Discussed diet and exercise for person with BMI >25 Will recheck weight in 3-6 months  7. Peripheral edema compression socks when on feet alot - furosemide (LASIX) 20 MG tablet; Take 1 tablet (20 mg total) by mouth daily.  Dispense: 90 tablet; Refill: 1   Labs pending Health Maintenance reviewed Diet and exercise encouraged  Follow up plan: 6 months   [name] Done, FNP

## 2020-05-05 ENCOUNTER — Other Ambulatory Visit: Payer: Self-pay | Admitting: Nurse Practitioner

## 2020-05-05 DIAGNOSIS — I10 Essential (primary) hypertension: Secondary | ICD-10-CM

## 2020-05-09 ENCOUNTER — Telehealth: Payer: Medicare HMO

## 2020-06-06 ENCOUNTER — Other Ambulatory Visit: Payer: Self-pay | Admitting: Nurse Practitioner

## 2020-06-06 DIAGNOSIS — I10 Essential (primary) hypertension: Secondary | ICD-10-CM

## 2020-06-14 ENCOUNTER — Ambulatory Visit: Payer: Medicare HMO | Admitting: Licensed Clinical Social Worker

## 2020-06-14 DIAGNOSIS — K219 Gastro-esophageal reflux disease without esophagitis: Secondary | ICD-10-CM

## 2020-06-14 DIAGNOSIS — F5101 Primary insomnia: Secondary | ICD-10-CM

## 2020-06-14 DIAGNOSIS — M170 Bilateral primary osteoarthritis of knee: Secondary | ICD-10-CM

## 2020-06-14 DIAGNOSIS — E119 Type 2 diabetes mellitus without complications: Secondary | ICD-10-CM

## 2020-06-14 DIAGNOSIS — I10 Essential (primary) hypertension: Secondary | ICD-10-CM

## 2020-06-14 NOTE — Patient Instructions (Signed)
Licensed Clinical Social Worker Visit Information  Materials Provided: No  06/14/2020  Name: Norma Barton       MRN: [id number]       DOB: [birth year]  Norma Barton is a 85 y.o. year old female who is a primary care patient of [name], FNP. The CCM team was consulted for assistance with [name] .   Review of patient status, including review of consultants reports, other relevant assessments, and collaboration with appropriate care team members and the patient's provider was performed as part of comprehensive patient evaluation and provision of chronic care management services.    SDOH (Social Determinants of Health) assessments performed: No;risk for tobacco use; risk for depression; risk for stress; risk for physical inactivity  LCSW called client home phone number several times today but LCSW was not able to speak via phone with client today. LCSW did leave phone message for client requesting that she please return call to LCSW at [phone]  Follow Up Plan: LCSW to call client in next 4 weeks to talk with her about health needs of client and managing health needs faced  LCSW was not able to speak via phone with client today; thus, the patient was not able to verbalize understanding of instructions provided today and was not able to accept or decline a print copy of patient instruction materials.   Kelton Pillar.Leverett Camplin MSW, LCSW Licensed Clinical Social Worker [facility] [phone]

## 2020-06-14 NOTE — Chronic Care Management (AMB) (Signed)
  Chronic Care Management    Clinical Social Work Follow Up Note  06/14/2020 Name: Norma Barton MRN: [id number] DOB: 15-Nov-1934  Norma Barton is a 85 y.o. year old female who is a primary care patient of [name], FNP. The CCM team was consulted for assistance with [name] .   Review of patient status, including review of consultants reports, other relevant assessments, and collaboration with appropriate care team members and the patient's provider was performed as part of comprehensive patient evaluation and provision of chronic care management services.    SDOH (Social Determinants of Health) assessments performed: No;risk for tobacco use; risk for depression; risk for stress; risk for physical inactivity    Chronic Care Management from 04/05/2020 in [facility]  PHQ-9 Total Score 3       GAD 7 : Generalized Anxiety Score 04/05/2020  Nervous, Anxious, on Edge 0  Control/stop worrying 0  Worry too much - different things 0  Trouble relaxing 0  Restless 0  Easily annoyed or irritable 0  Afraid - awful might happen 0  Total GAD 7 Score 0  Anxiety Difficulty Somewhat difficult    Outpatient Encounter Medications as of 06/14/2020  Medication Sig  . Cholecalciferol (VITAMIN D3) 125 MCG (5000 UT) CAPS Take by mouth.  . furosemide (LASIX) 20 MG tablet Take 1 tablet (20 mg total) by mouth daily.  . metoprolol succinate (TOPROL-XL) 25 MG 24 hr tablet Take 1 tablet (25 mg total) by mouth daily.  Marland Kitchen omeprazole (PRILOSEC) 40 MG capsule Take 1 capsule (40 mg total) by mouth daily.  . potassium chloride SA (KLOR-CON) 20 MEQ tablet Take 1 tablet (20 mEq total) by mouth daily.   No facility-administered encounter medications on file as of 06/14/2020.    LCSW called client home phone number several times today but LCSW was not able to speak via phone with client today. LCSW did leave phone message for client requesting that she please return call  to LCSW at [phone]  Follow Up Plan: LCSW to call client in next 4 weeks to talk with her about health needs of client and managing health needs faced  Kelton Pillar.Jasha Hodzic MSW, LCSW Licensed Clinical Social Worker [facility] [phone]

## 2020-07-20 ENCOUNTER — Ambulatory Visit: Payer: Medicare HMO | Admitting: Licensed Clinical Social Worker

## 2020-07-20 DIAGNOSIS — M170 Bilateral primary osteoarthritis of knee: Secondary | ICD-10-CM

## 2020-07-20 DIAGNOSIS — F5101 Primary insomnia: Secondary | ICD-10-CM

## 2020-07-20 DIAGNOSIS — I10 Essential (primary) hypertension: Secondary | ICD-10-CM

## 2020-07-20 DIAGNOSIS — K219 Gastro-esophageal reflux disease without esophagitis: Secondary | ICD-10-CM

## 2020-07-20 DIAGNOSIS — E119 Type 2 diabetes mellitus without complications: Secondary | ICD-10-CM

## 2020-07-20 NOTE — Chronic Care Management (AMB) (Signed)
Chronic Care Management    Clinical Social Work Follow Up Note  07/20/2020 Name: Norma Barton MRN: [id number] DOB: 1934-11-28  Norma Barton is a 85 y.o. year old female who is a primary care patient of Norma Pierini, FNP. The CCM team was consulted for assistance with Norma Barton .   Review of patient status, including review of consultants reports, other relevant assessments, and collaboration with appropriate care team members and the patient's provider was performed as part of comprehensive patient evaluation and provision of chronic care management services.    SDOH (Social Determinants of Health) assessments performed: No;risk for tobacco use; risk for depression; risk for stress; risk for physical inactivity    Chronic Care Management from 04/05/2020 in Norma Barton  PHQ-9 Total Score 3     GAD 7 : Generalized Anxiety Score 04/05/2020  Nervous, Anxious, on Edge 0  Control/stop worrying 0  Worry too much - different things 0  Trouble relaxing 0  Restless 0  Easily annoyed or irritable 0  Afraid - awful might happen 0  Total GAD 7 Score 0  Anxiety Difficulty Somewhat difficult    Outpatient Encounter Medications as of 07/20/2020  Medication Sig  . Cholecalciferol (VITAMIN D3) 125 MCG (5000 UT) CAPS Take by mouth.  . furosemide (LASIX) 20 MG tablet Take 1 tablet (20 mg total) by mouth daily.  . metoprolol succinate (TOPROL-XL) 25 MG 24 hr tablet Take 1 tablet (25 mg total) by mouth daily.  Marland Kitchen omeprazole (PRILOSEC) 40 MG capsule Take 1 capsule (40 mg total) by mouth daily.  . potassium chloride SA (KLOR-CON) 20 MEQ tablet Take 1 tablet (20 mEq total) by mouth daily.   No facility-administered encounter medications on file as of 07/20/2020.    Goals    .  Client will talk with LCSW in next 4 weeks about health needs of client and managing health needs of client (pt-stated)      CARE PLAN ENTRY   Current Barriers:  . Patient with  chronic diagnoses of HTN, OA, GERD, Insomnia, DM . Occasional Edema in legs  Clinical Social Work Clinical Goal(s):  Marland Kitchen LCSW will call client in next 4 weeks to talk with client about health needs of client and managing health needs of client  Interventions: . Talked with client about CCM program support . Talked with client about social work needs of client . Talked with client about social support network (sons are supportive) . Talked with client about mobility issues of client (she uses a cane to help her walk) . Talked with client about appetite of client . Talked with client about vision issues of client (uses reading glasses) . Talked with client about pain issues of client . Talked with client about occasional swelling in her legs . Talked with client about transport needs of client . Talked with client about her managing of Diabetes . Talked with client about relaxation techniques (watches TV) . Talked with Norma Barton about ADLs completion  Encouraged Norma Barton to call RNCM as needed for nursing support  Talked with Norma Barton about DME (she has a walker, a cane , and a 3 in 1 bedside commode)  Patient Self Care Activities:   Attends medical appointments  Patient Self Care Deficits:    Occasional swelling in legs  Initial goal documentation     Follow Up Plan:  LCSW to call client in next 4 weeks to talk with her about health needs of client and managing health needs faced  Norma Barton.Norma Barton MSW, LCSW Licensed Clinical Social Worker [facility] [phone]

## 2020-07-20 NOTE — Patient Instructions (Addendum)
Licensed Clinical Social Worker Visit Information  Goals we discussed today:   .  Client will talk with LCSW in next 4 weeks about health needs of client and managing health needs of client (pt-stated)        CARE PLAN ENTRY   Current Barriers:   Patient with chronic diagnoses of HTN, OA, GERD, Insomnia, DM  Occasional Edema in legs  Clinical Social Work Clinical Goal(s):   LCSW will call client in next 4 weeks to talk with client about health needs of client and managing health needs of client  Interventions:  Talked with client about CCM program support  Talked with client about social work needs of client  Talked with client about social support network (sons are supportive)  Talked with client about mobility issues of client (she uses a cane to help her walk)  Talked with client about appetite of client  Talked with client about vision issues of client (uses reading glasses)  Talked with client about pain issues of client  Talked with client about occasional swelling in her legs  Talked with client about transport needs of client  Talked with client about her managing of Diabetes  Talked with client about relaxation techniques (watches TV)  Talked with Norma Barton about ADLs completion  Encouraged Norma Barton to call RNCM as needed for nursing support  Talked with Norma Barton about DME (she has a walker, a cane , and a 3 in 1 bedside commode)  Patient Self Care Activities:   Attends medical appointments  Patient Self Care Deficits:    Occasional swelling in legs  Initial goal documentation     Follow Up Plan: LCSW to call client in next 4 weeks to talk with her about health needs of client and managing health needs faced  Materials Provided: No  The patient verbalized understanding of instructions provided today and declined a print copy of patient instruction materials.   Norma Barton.Norma Barton MSW, LCSW Licensed Clinical Social Worker [facility] [phone]

## 2020-08-24 ENCOUNTER — Ambulatory Visit: Payer: Medicare HMO | Admitting: Licensed Clinical Social Worker

## 2020-08-24 DIAGNOSIS — E119 Type 2 diabetes mellitus without complications: Secondary | ICD-10-CM

## 2020-08-24 DIAGNOSIS — I10 Essential (primary) hypertension: Secondary | ICD-10-CM

## 2020-08-24 DIAGNOSIS — M170 Bilateral primary osteoarthritis of knee: Secondary | ICD-10-CM

## 2020-08-24 DIAGNOSIS — F5101 Primary insomnia: Secondary | ICD-10-CM

## 2020-08-24 DIAGNOSIS — K219 Gastro-esophageal reflux disease without esophagitis: Secondary | ICD-10-CM

## 2020-08-24 NOTE — Chronic Care Management (AMB) (Signed)
  Chronic Care Management    Clinical Social Work Follow Up Note  08/24/2020 Name: [name] MRN: [id number] DOB: 1934-09-30  Norma Barton is a 85 y.o. year old female who is a primary care patient of [name], FNP. The CCM team was consulted for assistance with [name] .   Review of patient status, including review of consultants reports, other relevant assessments, and collaboration with appropriate care team members and the patient's provider was performed as part of comprehensive patient evaluation and provision of chronic care management services.    SDOH (Social Determinants of Health) assessments performed: No; risk for depression; risk for tobacco use; risk for stress; risk for physical inactivity  Flowsheet Row Chronic Care Management from 04/05/2020 in [facility]  PHQ-9 Total Score 3       GAD 7 : Generalized Anxiety Score 04/05/2020  Nervous, Anxious, on Edge 0  Control/stop worrying 0  Worry too much - different things 0  Trouble relaxing 0  Restless 0  Easily annoyed or irritable 0  Afraid - awful might happen 0  Total GAD 7 Score 0  Anxiety Difficulty Somewhat difficult    Outpatient Encounter Medications as of 08/24/2020  Medication Sig  . Cholecalciferol (VITAMIN D3) 125 MCG (5000 UT) CAPS Take by mouth.  . furosemide (LASIX) 20 MG tablet Take 1 tablet (20 mg total) by mouth daily.  . metoprolol succinate (TOPROL-XL) 25 MG 24 hr tablet Take 1 tablet (25 mg total) by mouth daily.  Marland Kitchen omeprazole (PRILOSEC) 40 MG capsule Take 1 capsule (40 mg total) by mouth daily.  . potassium chloride SA (KLOR-CON) 20 MEQ tablet Take 1 tablet (20 mEq total) by mouth daily.   No facility-administered encounter medications on file as of 08/24/2020.    LCSW called client home number several times today but LCSW was not able to speak via phone with client today; LCSW did leave phone message for client requesting that she call LCSW  at [phone]  Follow Up Plan: LCSW to call client in next 4 weeks to talk with her about health needs of client and managing health needs faced  Kelton Pillar.Jaking Thayer MSW, LCSW Licensed Clinical Social Worker [facility] [phone]

## 2020-08-24 NOTE — Patient Instructions (Addendum)
Licensed Clinical Social Worker Visit Information  Materials Provided: No  08/24/2020  Name: Norma Barton       MRN: [id number]       DOB: 07-Apr-1935  Norma Barton is a 85 y.o. year old female who is a primary care patient of [name], FNP. The CCM team was consulted for assistance with [name] .   Review of patient status, including review of consultants reports, other relevant assessments, and collaboration with appropriate care team members and the patient's provider was performed as part of comprehensive patient evaluation and provision of chronic care management services.    SDOH (Social Determinants of Health) assessments performed: No; risk for depression; risk for tobacco use; risk for stress; risk for physical inactivity  LCSW called client home number several times today but LCSW was not able to speak via phone with client today; LCSW did leave phone message for client requesting that she call LCSW at [phone]  Follow Up Plan:LCSW to call client in next 4 weeks to talk with her about health needs of client and managing health needs faced  LCSW was not able to speak via phone with client today; thus the client was not able to verbalize understanding of instructions provided today and was not able to accept or decline a print copy of patient instruction materials.   Kelton Pillar.Armstrong Creasy MSW, LCSW Licensed Clinical Social Worker [facility] [phone]

## 2020-08-29 ENCOUNTER — Telehealth: Payer: Self-pay

## 2020-08-29 NOTE — Telephone Encounter (Signed)
Called patient to follow up on recommended breast imaging due in October - patient states that she would like to wait until after the first of the year.  This will start patient back on year screening protocol.  Patient would like to have done at mobile unit that comes to our location and she will call to make appointment at later date.

## 2020-09-28 ENCOUNTER — Other Ambulatory Visit: Payer: Self-pay | Admitting: Nurse Practitioner

## 2020-09-28 DIAGNOSIS — R600 Localized edema: Secondary | ICD-10-CM

## 2020-09-28 DIAGNOSIS — R609 Edema, unspecified: Secondary | ICD-10-CM

## 2020-09-29 ENCOUNTER — Ambulatory Visit: Payer: Medicare HMO | Admitting: Licensed Clinical Social Worker

## 2020-09-29 DIAGNOSIS — E119 Type 2 diabetes mellitus without complications: Secondary | ICD-10-CM

## 2020-09-29 DIAGNOSIS — F5101 Primary insomnia: Secondary | ICD-10-CM

## 2020-09-29 DIAGNOSIS — I10 Essential (primary) hypertension: Secondary | ICD-10-CM

## 2020-09-29 DIAGNOSIS — K219 Gastro-esophageal reflux disease without esophagitis: Secondary | ICD-10-CM

## 2020-09-29 DIAGNOSIS — M170 Bilateral primary osteoarthritis of knee: Secondary | ICD-10-CM

## 2020-09-29 NOTE — Chronic Care Management (AMB) (Signed)
Chronic Care Management    Clinical Social Work Follow Up Note  09/29/2020 Name: Norma Barton MRN: [id number] DOB: Mar 15, 1935  Norma Barton is a 86 y.o. year old female who is a primary care patient of [name], FNP. The CCM team was consulted for assistance with [name] .   Review of patient status, including review of consultants reports, other relevant assessments, and collaboration with appropriate care team members and the patient's provider was performed as part of comprehensive patient evaluation and provision of chronic care management services.    SDOH (Social Determinants of Health) assessments performed: No; risk for depression; risk for tobacco use; risk for stress; risk for physical inactivity  Flowsheet Row Chronic Care Management from 04/05/2020 in [facility]  PHQ-9 Total Score 3      GAD 7 : Generalized Anxiety Score 04/05/2020  Nervous, Anxious, on Edge 0  Control/stop worrying 0  Worry too much - different things 0  Trouble relaxing 0  Restless 0  Easily annoyed or irritable 0  Afraid - awful might happen 0  Total GAD 7 Score 0  Anxiety Difficulty Somewhat difficult    Outpatient Encounter Medications as of 09/29/2020  Medication Sig  . Cholecalciferol (VITAMIN D3) 125 MCG (5000 UT) CAPS Take by mouth.  . furosemide (LASIX) 20 MG tablet TAKE 1 TABLET BY MOUTH EVERY DAY  . metoprolol succinate (TOPROL-XL) 25 MG 24 hr tablet Take 1 tablet (25 mg total) by mouth daily.  Marland Kitchen omeprazole (PRILOSEC) 40 MG capsule Take 1 capsule (40 mg total) by mouth daily.  . potassium chloride SA (KLOR-CON) 20 MEQ tablet Take 1 tablet (20 mEq total) by mouth daily.   No facility-administered encounter medications on file as of 09/29/2020.    Goals    .  Client will talk with LCSW in next 4 weeks about health needs of client and managing health needs of client (pt-stated)      CARE PLAN ENTRY   Current Barriers:  . Patient with  chronic diagnoses of HTN, OA, GERD, Insomnia, DM . Occasional Edema in legs  Clinical Social Work Clinical Goal(s):  Marland Kitchen LCSW will call client in next 4 weeks to talk with client about health needs of client and managing health needs of client  Interventions: . Talked with client about CCM program support . Talked with client about social work needs of client . Talked with client about social support network (sons are supportive) . Talked with client about mobility issues of client (she uses a cane to help her walk) . Talked with client about appetite of client . Talked with client about vision issues of client (uses reading glasses) . Talked with client about pain issues of client . Talked with client about occasional swelling in her legs . Talked with client about transport needs of client . Talked with client about her managing of Diabetes . Talked with client about relaxation techniques (watches TV) . Talked with Norma Barton about ADLs completion . Encouraged Norma Barton to call RNCM as needed for nursing support . Talked with Norma Barton about meal provision (she said she still cooks) . Talked with Norma Barton about medication procurement for client . Talked with Norma Barton about pain in her right knee (she said she sees orthopedist, Dr. Romeo Apple, in [place], [place])  Patient Self Care Activities:   Attends medical appointments  Patient Self Care Deficits:    Occasional swelling in legs  Initial goal documentation     Follow Up Plan:LCSW to call  client in next 4 weeks to talk with her about health needs of client and managing health needs faced  Kelton Pillar.Jariyah Hackley MSW, LCSW Licensed Clinical Social Worker [facility] [phone]

## 2020-09-29 NOTE — Patient Instructions (Addendum)
Licensed Clinical Social Worker Visit Information  Goals we discussed today:   .  Client will talk with LCSW in next 4 weeks about health needs of client and managing health needs of client (pt-stated)        CARE PLAN ENTRY   Current Barriers:   Patient with chronic diagnoses of HTN, OA, GERD, Insomnia, DM  Occasional Edema in legs  Clinical Social Work Clinical Goal(s):   LCSW will call client in next 4 weeks to talk with client about health needs of client and managing health needs of client  Interventions:  Talked with client about CCM program support  Talked with client about social work needs of client  Talked with client about social support network (sons are supportive)  Talked with client about mobility issues of client (she uses a cane to help her walk)  Talked with client about appetite of client  Talked with client about vision issues of client (uses reading glasses)  Talked with client about pain issues of client  Talked with client about occasional swelling in her legs  Talked with client about transport needs of client  Talked with client about her managing of Diabetes  Talked with client about relaxation techniques (watches TV)  Talked with Kathie Rhodes about ADLs completion  Encouraged Sydney to call RNCM as needed for nursing support  Talked with Quintella about meal provision (she said she still cooks)  Talked with Kathie Rhodes about medication procurement for client  Talked with Jayelle about pain in her right knee (she said she sees orthopedist, Dr. Romeo Apple, in [place], [place])  Patient Self Care Activities:   Attends medical appointments  Patient Self Care Deficits:    Occasional swelling in legs  Initial goal documentation     Follow Up Plan:LCSW to call client in next 4 weeks to talk with her about health needs of client and managing health needs faced  Materials Provided: No  The patient verbalized understanding of instructions  provided today and declined a print copy of patient instruction materials.   Kelton Pillar.Monterius Rolf MSW, LCSW Licensed Clinical Social Worker [facility] Care Management [phone]

## 2020-10-20 ENCOUNTER — Ambulatory Visit (INDEPENDENT_AMBULATORY_CARE_PROVIDER_SITE_OTHER): Payer: Medicare HMO | Admitting: Nurse Practitioner

## 2020-10-20 ENCOUNTER — Encounter: Payer: Self-pay | Admitting: Nurse Practitioner

## 2020-10-20 ENCOUNTER — Other Ambulatory Visit: Payer: Self-pay

## 2020-10-20 VITALS — BP 110/64 | HR 69 | Temp 97.9°F | Resp 20 | Ht 62.0 in | Wt 161.0 lb

## 2020-10-20 DIAGNOSIS — E876 Hypokalemia: Secondary | ICD-10-CM

## 2020-10-20 DIAGNOSIS — Z23 Encounter for immunization: Secondary | ICD-10-CM

## 2020-10-20 DIAGNOSIS — E119 Type 2 diabetes mellitus without complications: Secondary | ICD-10-CM

## 2020-10-20 DIAGNOSIS — M170 Bilateral primary osteoarthritis of knee: Secondary | ICD-10-CM

## 2020-10-20 DIAGNOSIS — K219 Gastro-esophageal reflux disease without esophagitis: Secondary | ICD-10-CM

## 2020-10-20 DIAGNOSIS — Z6830 Body mass index (BMI) 30.0-30.9, adult: Secondary | ICD-10-CM

## 2020-10-20 DIAGNOSIS — F5101 Primary insomnia: Secondary | ICD-10-CM

## 2020-10-20 DIAGNOSIS — I10 Essential (primary) hypertension: Secondary | ICD-10-CM

## 2020-10-20 DIAGNOSIS — R609 Edema, unspecified: Secondary | ICD-10-CM | POA: Diagnosis not present

## 2020-10-20 DIAGNOSIS — R69 Illness, unspecified: Secondary | ICD-10-CM | POA: Diagnosis not present

## 2020-10-20 LAB — BAYER DCA HB A1C WAIVED: HB A1C (BAYER DCA - WAIVED): 5.6 % (ref <7.0)

## 2020-10-20 MED ORDER — OMEPRAZOLE 40 MG PO CPDR: 40.0000 mg | DELAYED_RELEASE_CAPSULE | Freq: Every day | ORAL | 1 refills | Status: DC

## 2020-10-20 MED ORDER — FUROSEMIDE 20 MG PO TABS: 20.0000 mg | ORAL_TABLET | Freq: Every day | ORAL | 1 refills | Status: DC

## 2020-10-20 MED ORDER — METOPROLOL SUCCINATE ER 25 MG PO TB24: 25.0000 mg | ORAL_TABLET | Freq: Every day | ORAL | 1 refills | Status: DC

## 2020-10-20 MED ORDER — POTASSIUM CHLORIDE CRYS ER 20 MEQ PO TBCR: 20.0000 meq | EXTENDED_RELEASE_TABLET | Freq: Every day | ORAL | 1 refills | Status: DC

## 2020-10-20 NOTE — Patient Instructions (Signed)
Peripheral Edema  Peripheral edema is swelling that is caused by a buildup of fluid. Peripheral edema most often affects the lower legs, ankles, and feet. It can also develop in the arms, hands, and face. The area of the body that has peripheral edema will look swollen. It may also feel heavy or warm. Your clothes may start to feel tight. Pressing on the area may make a temporary dent in your skin. You may not be able to move your swollen arm or leg as much as usual. There are many causes of peripheral edema. It can happen because of a complication of other conditions such as congestive heart failure, kidney disease, or a problem with your blood circulation. It also can be a side effect of certain medicines or because of an infection. It often happens to women during pregnancy. Sometimes, the cause is not known. Follow these instructions at home: Managing pain, stiffness, and swelling  Raise (elevate) your legs while you are sitting or lying down.  Move around often to prevent stiffness and to lessen swelling.  Do not sit or stand for long periods of time.  Wear support stockings as told by your health care provider.   Medicines  Take over-the-counter and prescription medicines only as told by your health care provider.  Your health care provider may prescribe medicine to help your body get rid of excess water (diuretic). General instructions  Pay attention to any changes in your symptoms.  Follow instructions from your health care provider about limiting salt (sodium) in your diet. Sometimes, eating less salt may reduce swelling.  Moisturize skin daily to help prevent skin from cracking and draining.  Keep all follow-up visits as told by your health care provider. This is important. Contact a health care provider if you have:  A fever.  Edema that starts suddenly or is getting worse, especially if you are pregnant or have a medical condition.  Swelling in only one leg.  Increased  swelling, redness, or pain in one or both of your legs.  Drainage or sores at the area where you have edema. Get help right away if you:  Develop shortness of breath, especially when you are lying down.  Have pain in your chest or abdomen.  Feel weak.  Feel faint. Summary  Peripheral edema is swelling that is caused by a buildup of fluid. Peripheral edema most often affects the lower legs, ankles, and feet.  Move around often to prevent stiffness and to lessen swelling. Do not sit or stand for long periods of time.  Pay attention to any changes in your symptoms.  Contact a health care provider if you have edema that starts suddenly or is getting worse, especially if you are pregnant or have a medical condition.  Get help right away if you develop shortness of breath, especially when lying down. This information is not intended to replace advice given to you by your health care provider. Make sure you discuss any questions you have with your health care provider. Document Revised: 05/27/2018 Document Reviewed: 05/27/2018 Elsevier Patient Education  2021 Elsevier Inc.  

## 2020-10-20 NOTE — Progress Notes (Signed)
Subjective:    Patient ID: Norma Barton, female    DOB: 1935-09-01, 86 y.o.   MRN: [id number]   Chief Complaint: Medical Management of Chronic Issues    HPI:  1. Primary hypertension No c/o chest pain, sob or headache. Does not check blood pressure at home. BP Readings from Last 3 Encounters:  10/20/20 110/64  04/18/20 115/67  10/15/19 107/66     2. Diabetes mellitus without complication ([place]) She does not check her blood sugras at home. She does try to watch her diet Lab Results  Component Value Date   HGBA1C 6.6 02/21/2020     3. Hypokalemia No c/o lower ext cramping. Lab Results  Component Value Date   K 3.1 (L) 02/21/2020     4. Gastroesophageal reflux disease without esophagitis Is on omperazole daily and is doing well.  5. Primary insomnia Has trouble sleeping every night. She only takes advil PM only on nights when she has some where to go the next morning  6. Primary osteoarthritis of both knees bil knee pain. Does ok if she does not walk far  7. BMI 30.0-30.9,adult no recent weight changes  Wt Readings from Last 3 Encounters:  10/20/20 161 lb (73 kg)  04/18/20 160 lb (72.6 kg)  10/15/19 160 lb (72.6 kg)   BMI Readings from Last 3 Encounters:  10/20/20 29.45 kg/m  04/18/20 29.26 kg/m  10/15/19 29.26 kg/m      Outpatient Encounter Medications as of 10/20/2020  Medication Sig  . Cholecalciferol (VITAMIN D3) 125 MCG (5000 UT) CAPS Take by mouth.  . furosemide (LASIX) 20 MG tablet TAKE 1 TABLET BY MOUTH EVERY DAY  . metoprolol succinate (TOPROL-XL) 25 MG 24 hr tablet Take 1 tablet (25 mg total) by mouth daily.  Marland Kitchen omeprazole (PRILOSEC) 40 MG capsule Take 1 capsule (40 mg total) by mouth daily.  . potassium chloride SA (KLOR-CON) 20 MEQ tablet Take 1 tablet (20 mEq total) by mouth daily.     Past Surgical History:  Procedure Laterality Date  . ABDOMINAL HYSTERECTOMY    . arthroscopy right knee    . TOTAL KNEE ARTHROPLASTY Left  08/15/2016   Procedure: TOTAL KNEE ARTHROPLASTY;  Surgeon: Carole Civil, MD;  Location: [facility];  Service: Orthopedics;  Laterality: Left;    Family History  Problem Relation Age of Onset  . Parkinson's disease Father   . Breast cancer Sister     New complaints: None today  Social history: Lives by herself  Controlled substance contract: n/a    Review of Systems  Constitutional: Negative for diaphoresis.  Eyes: Negative for pain.  Respiratory: Negative for shortness of breath.   Cardiovascular: Negative for chest pain, palpitations and leg swelling.  Gastrointestinal: Negative for abdominal pain.  Endocrine: Negative for polydipsia.  Skin: Negative for rash.  Neurological: Negative for dizziness, weakness and headaches.  Hematological: Does not bruise/bleed easily.  All other systems reviewed and are negative.      Objective:   Physical Exam Vitals and nursing note reviewed.  Constitutional:      General: She is not in acute distress.    Appearance: Normal appearance. She is well-developed and well-nourished.  HENT:     Head: Normocephalic.     Nose: Nose normal.     Mouth/Throat:     Mouth: Oropharynx is clear and moist.  Eyes:     Extraocular Movements: EOM normal.     Pupils: Pupils are equal, round, and reactive to light.  Neck:  Vascular: No carotid bruit or JVD.  Cardiovascular:     Rate and Rhythm: Normal rate and regular rhythm.     Pulses: Intact distal pulses.     Heart sounds: Normal heart sounds.  Pulmonary:     Effort: Pulmonary effort is normal. No respiratory distress.     Breath sounds: Normal breath sounds. No wheezing or rales.  Chest:     Chest wall: No tenderness.  Abdominal:     General: Bowel sounds are normal. There is no distension or abdominal bruit. Aorta is normal.     Palpations: Abdomen is soft. There is no hepatomegaly, splenomegaly, mass or pulsatile mass.     Tenderness: There is no abdominal tenderness.   Musculoskeletal:        General: Normal range of motion.     Cervical back: Normal range of motion and neck supple.     Right lower leg: Edema (2+) present.     Left lower leg: Edema (1+) present.  Lymphadenopathy:     Cervical: No cervical adenopathy.  Skin:    General: Skin is warm and dry.  Neurological:     Mental Status: She is alert and oriented to person, place, and time.     Deep Tendon Reflexes: Reflexes are normal and symmetric.  Psychiatric:        Mood and Affect: Mood and affect normal.        Behavior: Behavior normal.        Thought Content: Thought content normal.        Judgment: Judgment normal.    BP 110/64   Pulse 69   Temp 97.9 F (36.6 C) (Temporal)   Resp 20   Ht 5' 2"  (1.575 m)   Wt 161 lb (73 kg)   SpO2 94%   BMI 29.45 kg/m   HGBA1c 5.6%     Assessment & Plan:  KADELYN DIMASCIO comes in today with chief complaint of Medical Management of Chronic Issues   Diagnosis and orders addressed:  1. Primary hypertension Low sodium diet - CBC with Differential/Platelet - CMP14+EGFR  - metoprolol succinate (TOPROL-XL) 25 MG 24 hr tablet; Take 1 tablet (25 mg total) by mouth daily.  Dispense: 90 tablet; Refill: 1 2. Diabetes mellitus without complication ([place]) Continue to watch carbs in diet - Bayer DCA Hb A1c Waived - Lipid panel  3. Hypokalemia Need to check and make sure you are taking potassium - potassium chloride SA (KLOR-CON) 20 MEQ tablet; Take 1 tablet (20 mEq total) by mouth daily.  Dispense: 90 tablet; Refill: 1  4. Gastroesophageal reflux disease without esophagitis Avoid spicy foods Do not eat 2 hours prior to bedtime - omeprazole (PRILOSEC) 40 MG capsule; Take 1 capsule (40 mg total) by mouth daily.  Dispense: 90 capsule; Refill: 1  5. Primary insomnia Bedtime routine  6. Primary osteoarthritis of both knees Rest when needed  7. BMI 30.0-30.9,adult Discussed diet and exercise for person with BMI >25 Will recheck weight in  3-6 months  8. Peripheral edema Elevate legs when sitting - furosemide (LASIX) 20 MG tablet; Take 1 tablet (20 mg total) by mouth daily.  Dispense: 90 tablet; Refill: 1   Labs pending Health Maintenance reviewed Diet and exercise encouraged  Follow up plan: 6 months   [name] Done, FNP

## 2020-10-21 LAB — CMP14+EGFR
ALT: 24 IU/L (ref 0–32)
AST: 40 IU/L (ref 0–40)
Albumin/Globulin Ratio: 1.5 (ref 1.2–2.2)
Albumin: 4 g/dL (ref 3.6–4.6)
Alkaline Phosphatase: 102 IU/L (ref 44–121)
BUN/Creatinine Ratio: 15 (ref 12–28)
BUN: 19 mg/dL (ref 8–27)
Bilirubin Total: 0.5 mg/dL (ref 0.0–1.2)
CO2: 26 mmol/L (ref 20–29)
Calcium: 9.8 mg/dL (ref 8.7–10.3)
Chloride: 102 mmol/L (ref 96–106)
Creatinine, Ser: 1.25 mg/dL — ABNORMAL HIGH (ref 0.57–1.00)
GFR calc Af Amer: 46 mL/min/{1.73_m2} — ABNORMAL LOW (ref >59)
GFR calc non Af Amer: 40 mL/min/{1.73_m2} — ABNORMAL LOW (ref >59)
Globulin, Total: 2.7 g/dL (ref 1.5–4.5)
Glucose: 117 mg/dL — ABNORMAL HIGH (ref 65–99)
Potassium: 3.7 mmol/L (ref 3.5–5.2)
Sodium: 144 mmol/L (ref 134–144)
Total Protein: 6.7 g/dL (ref 6.0–8.5)

## 2020-10-21 LAB — LIPID PANEL
Chol/HDL Ratio: 2.6 ratio (ref 0.0–4.4)
Cholesterol, Total: 159 mg/dL (ref 100–199)
HDL: 61 mg/dL (ref >39)
LDL Chol Calc (NIH): 65 mg/dL (ref 0–99)
Triglycerides: 206 mg/dL — ABNORMAL HIGH (ref 0–149)
VLDL Cholesterol Cal: 33 mg/dL (ref 5–40)

## 2020-10-21 LAB — CBC WITH DIFFERENTIAL/PLATELET
Basophils Absolute: 0.1 10*3/uL (ref 0.0–0.2)
Basos: 1 %
EOS (ABSOLUTE): 0.2 10*3/uL (ref 0.0–0.4)
Eos: 2 %
Hematocrit: 47.3 % — ABNORMAL HIGH (ref 34.0–46.6)
Hemoglobin: 15.4 g/dL (ref 11.1–15.9)
Immature Grans (Abs): 0 10*3/uL (ref 0.0–0.1)
Immature Granulocytes: 0 %
Lymphocytes Absolute: 3.3 10*3/uL — ABNORMAL HIGH (ref 0.7–3.1)
Lymphs: 47 %
MCH: 31.6 pg (ref 26.6–33.0)
MCHC: 32.6 g/dL (ref 31.5–35.7)
MCV: 97 fL (ref 79–97)
Monocytes Absolute: 0.6 10*3/uL (ref 0.1–0.9)
Monocytes: 9 %
Neutrophils Absolute: 2.8 10*3/uL (ref 1.4–7.0)
Neutrophils: 41 %
Platelets: 152 10*3/uL (ref 150–450)
RBC: 4.88 x10E6/uL (ref 3.77–5.28)
RDW: 12.1 % (ref 11.7–15.4)
WBC: 7 10*3/uL (ref 3.4–10.8)

## 2020-10-23 DIAGNOSIS — Z6830 Body mass index (BMI) 30.0-30.9, adult: Secondary | ICD-10-CM | POA: Diagnosis not present

## 2020-10-23 DIAGNOSIS — Z23 Encounter for immunization: Secondary | ICD-10-CM | POA: Diagnosis not present

## 2020-10-23 DIAGNOSIS — R69 Illness, unspecified: Secondary | ICD-10-CM | POA: Diagnosis not present

## 2020-10-23 DIAGNOSIS — I10 Essential (primary) hypertension: Secondary | ICD-10-CM | POA: Diagnosis not present

## 2020-10-23 DIAGNOSIS — E119 Type 2 diabetes mellitus without complications: Secondary | ICD-10-CM | POA: Diagnosis not present

## 2020-10-23 DIAGNOSIS — R609 Edema, unspecified: Secondary | ICD-10-CM | POA: Diagnosis not present

## 2020-10-23 DIAGNOSIS — K219 Gastro-esophageal reflux disease without esophagitis: Secondary | ICD-10-CM | POA: Diagnosis not present

## 2020-10-23 DIAGNOSIS — M170 Bilateral primary osteoarthritis of knee: Secondary | ICD-10-CM | POA: Diagnosis not present

## 2020-10-23 DIAGNOSIS — E876 Hypokalemia: Secondary | ICD-10-CM | POA: Diagnosis not present

## 2020-11-02 ENCOUNTER — Ambulatory Visit (INDEPENDENT_AMBULATORY_CARE_PROVIDER_SITE_OTHER): Payer: Medicare HMO | Admitting: Licensed Clinical Social Worker

## 2020-11-02 DIAGNOSIS — M170 Bilateral primary osteoarthritis of knee: Secondary | ICD-10-CM

## 2020-11-02 DIAGNOSIS — E119 Type 2 diabetes mellitus without complications: Secondary | ICD-10-CM | POA: Diagnosis not present

## 2020-11-02 DIAGNOSIS — I10 Essential (primary) hypertension: Secondary | ICD-10-CM

## 2020-11-02 DIAGNOSIS — F5101 Primary insomnia: Secondary | ICD-10-CM

## 2020-11-02 DIAGNOSIS — K219 Gastro-esophageal reflux disease without esophagitis: Secondary | ICD-10-CM

## 2020-11-02 NOTE — Chronic Care Management (AMB) (Signed)
  Chronic Care Management    Clinical Social Work Follow Up Note  11/02/2020 Name: Norma Barton MRN: [id number] DOB: 07-24-1935  Norma Barton is a 86 y.o. year old female who is a primary care patient of [name], FNP. The CCM team was consulted for assistance with [name] .   Review of patient status, including review of consultants reports, other relevant assessments, and collaboration with appropriate care team members and the patient's provider was performed as part of comprehensive patient evaluation and provision of chronic care management services.    SDOH (Social Determinants of Health) assessments performed: No; risk for depression ; risk for tobacco use; risk for stress; risk for physical inactivity  Flowsheet Row Chronic Care Management from 04/05/2020 in [facility]  PHQ-9 Total Score 3      GAD 7 : Generalized Anxiety Score 04/05/2020  Nervous, Anxious, on Edge 0  Control/stop worrying 0  Worry too much - different things 0  Trouble relaxing 0  Restless 0  Easily annoyed or irritable 0  Afraid - awful might happen 0  Total GAD 7 Score 0  Anxiety Difficulty Somewhat difficult    Outpatient Encounter Medications as of 11/02/2020  Medication Sig  . Cholecalciferol (VITAMIN D3) 125 MCG (5000 UT) CAPS Take by mouth.  . furosemide (LASIX) 20 MG tablet Take 1 tablet (20 mg total) by mouth daily.  . metoprolol succinate (TOPROL-XL) 25 MG 24 hr tablet Take 1 tablet (25 mg total) by mouth daily.  Marland Kitchen omeprazole (PRILOSEC) 40 MG capsule Take 1 capsule (40 mg total) by mouth daily.  . potassium chloride SA (KLOR-CON) 20 MEQ tablet Take 1 tablet (20 mEq total) by mouth daily.   No facility-administered encounter medications on file as of 11/02/2020.    Goals    .  Client will talk with LCSW in next 4 weeks about health needs of client and managing health needs of client (pt-stated)      CARE PLAN ENTRY   Current Barriers:   . Patient with chronic diagnoses of HTN, OA, GERD, Insomnia, DM . Occasional Edema in legs  Clinical Social Work Clinical Goal(s):  Marland Kitchen LCSW will call client in next 4 weeks to talk with client about health needs of client and managing health needs of client  Interventions:  Talked with [name], son of client about memory issues of client Talked with [name] about CCM program support Talked with [name] about mobility issues of client (she uses a cane to help her walk) Talked with [name] about appetite of client Talked with [name] about pain issues of client Talked with [name] about transport needs of client Talked with [name] about client managing of Diabetes Talked with [name] about relaxation techniques of client (watches TV) Talked with [name] about ADLs completion of client Encouraged Kathie Rhodes or [name] call [facility] as needed for nursing support  Patient Self Care Activities:   Attends medical appointments  Patient Self Care Deficits:    Occasional swelling in legs  Initial goal documentation     Follow Up Plan:LCSW to call client/ son [name], in next 4 weeks to talk with client or her son about health needs of client and managing health needs faced  Kelton Pillar.Catlin Aycock MSW, LCSW Licensed Clinical Social Worker [facility] Care Management [phone]

## 2020-11-02 NOTE — Patient Instructions (Addendum)
Licensed Clinical Social Worker Visit Information  Goals we discussed today:  .  Client will talk with LCSW in next 4 weeks about health needs of client and managing health needs of client (pt-stated)        CARE PLAN ENTRY   Current Barriers:   Patient with chronic diagnoses of HTN, OA, GERD, Insomnia, DM  Occasional Edema in legs  Clinical Social Work Clinical Goal(s):   LCSW will call client in next 4 weeks to talk with client about health needs of client and managing health needs of client  Interventions:  Talked with [name], son of client about memory issues of client Talked with Norma Barton about CCM program support Talked with Norma Barton about mobility issues of client (she uses a cane to help her walk) Talked with Norma Barton about appetite of client Talked with Norma Barton about pain issues of client Talked with Norma Barton about transport needs of client Talked with Norma Barton about client managing of Diabetes Talked with Norma Barton about relaxation techniques of client (watches TV) Talked with Norma Barton about ADLs completion of client Encouraged Norma Barton or Norma Barton call RNCM as needed for nursing support  Patient Self Care Activities:   Attends medical appointments  Patient Self Care Deficits:    Occasional swelling in legs  Initial goal documentation     Follow Up Plan:LCSW to call client/ son Norma Barton, in next 4 weeks to talk with client or her son about health needs of client and managing health needs faced  Materials Provided: No  The patient/Norma Barton, son of client, verbalized understanding of instructions provided today and declined a print copy of patient instruction materials.   Kelton Pillar.Zienna Ahlin MSW, LCSW Licensed Clinical Social Worker [facility] Care Management [phone]

## 2020-12-04 ENCOUNTER — Ambulatory Visit (INDEPENDENT_AMBULATORY_CARE_PROVIDER_SITE_OTHER): Payer: Medicare HMO | Admitting: Licensed Clinical Social Worker

## 2020-12-04 DIAGNOSIS — I10 Essential (primary) hypertension: Secondary | ICD-10-CM

## 2020-12-04 DIAGNOSIS — F5101 Primary insomnia: Secondary | ICD-10-CM

## 2020-12-04 DIAGNOSIS — E119 Type 2 diabetes mellitus without complications: Secondary | ICD-10-CM

## 2020-12-04 DIAGNOSIS — M170 Bilateral primary osteoarthritis of knee: Secondary | ICD-10-CM | POA: Diagnosis not present

## 2020-12-04 DIAGNOSIS — E876 Hypokalemia: Secondary | ICD-10-CM

## 2020-12-04 DIAGNOSIS — K219 Gastro-esophageal reflux disease without esophagitis: Secondary | ICD-10-CM

## 2020-12-04 NOTE — Patient Instructions (Signed)
Visit Information  PATIENT GOALS: Goals Addressed              This Visit's Progress   .  Manage medical needs faced and complete ADLs daily (pt-stated)        Timeframe:  Short-Term Goal Priority:  Medium Start Date:        12/04/20                     Expected End Date:       03/06/21                Follow Up Date 01/04/21    Protect My Health (Patient) Manage health needs of client and manage daily activities as needed    Why is this important?    Screening tests can find diseases early when they are easier to treat.   Your doctor or nurse will talk with you about which tests are important for you.   Getting shots for common diseases like the flu and shingles will help prevent them.      Patient Strengths:  Takes medications as prescribed Attends scheduled medical appointments Has support from her two sons Communicates well with others  Patient Deficits:  Challenges in managing medical needs Pain issues Mobility issues  Patient Goals:   Attend scheduled medical appointments Take medications as prescribed Call RNCM or LCSW as needed for support Call medical provider as needed for information about managing client medical issues faced -  Follow Up Plan: LCSW to call client on 01/04/21 to assess client needs       Kelton Pillar.Bertina Guthridge MSW, LCSW Licensed Clinical Social Worker [facility] Care Management [phone]

## 2020-12-04 NOTE — Chronic Care Management (AMB) (Signed)
Chronic Care Management    Clinical Social Work Note  12/04/2020 Name: Norma Barton MRN: [id number] DOB: 09-Apr-1935  Norma Barton is a 86 y.o. year old female who is a primary care patient of [name], FNP. The CCM team was consulted to assist the patient with chronic disease management and/or care coordination needs related to: [name] .   Engaged with patient Norma Barton, son of patient, by telephone for follow up visit in response to provider referral for social work chronic care management and care coordination services.   Consent to Services:  The patient was given information about Chronic Care Management services, agreed to services, and gave verbal consent prior to initiation of services.  Please see initial visit note for detailed documentation.   Patient agreed to services and consent obtained.   Assessment: Review of patient past medical history, allergies, medications, and health status, including review of relevant consultants reports was performed today as part of a comprehensive evaluation and provision of chronic care management and care coordination services.     SDOH (Social Determinants of Health) assessments and interventions performed:    Advanced Directives Status: See Vynca application for related entries.  CCM Care Plan  No Known Allergies  Outpatient Encounter Medications as of 12/04/2020  Medication Sig  . Cholecalciferol (VITAMIN D3) 125 MCG (5000 UT) CAPS Take by mouth.  . furosemide (LASIX) 20 MG tablet Take 1 tablet (20 mg total) by mouth daily.  . metoprolol succinate (TOPROL-XL) 25 MG 24 hr tablet Take 1 tablet (25 mg total) by mouth daily.  Marland Kitchen omeprazole (PRILOSEC) 40 MG capsule Take 1 capsule (40 mg total) by mouth daily.  . potassium chloride SA (KLOR-CON) 20 MEQ tablet Take 1 tablet (20 mEq total) by mouth daily.   No facility-administered encounter medications on file as of 12/04/2020.    Patient Active Problem  List   Diagnosis Date Noted  . Diabetes mellitus without complication ([facility]) 10/19/2019  . BMI 30.0-30.9,adult 01/12/2018  . Gastroesophageal reflux disease without esophagitis 01/12/2018  . Hypokalemia 01/12/2018  . Hypertension 04/12/2013  . Osteoarthritis of both knees 04/12/2013  . Insomnia 11/22/2012  . Cataract 08/04/2012    Conditions to be addressed/monitored: ; Monitor ADLs completion of client; monitor pain issues of client  Care Plan : LCSW Care plan  Updates made by [name], LCSW since 12/04/2020 12:00 AM    Problem: Coping Skills (General Plan of Care)     Goal: Manage medical needs of client and manage daily activities of client   Start Date: 12/04/2020  Expected End Date: 03/06/2021  This Visit's Progress: On track  Priority: Medium  Note:   Current barriers:   . Patient in need of assistance with connecting to community resources for assistance in managing ongoing client medical conditions . Acknowledges deficits with meeting this unmet need . Patient is unable to independently navigate community resource options without care coordination support . Mobility issues . Pain issues  Clinical Goals:  patient will work with SW in next 30 days  to address concerns related to client management of health needs faced and related to client completion of daily ADLs Client to call RNCM or LCSW  as needed for support Client to talk with LCSW in next 30 days related to mobility issues of client and related to pain issues of client   Clinical Interventions:  . Collaboration with [name], FNP regarding development and update of comprehensive plan of care as evidenced by provider attestation  and co-signature . Talked with Norma Barton, son, about social interactions of client . Talked with [name] about pain issues of client . Talked with [name] about ambulation issues of client  Patient Strengths:  Takes medications as prescribed Attends scheduled  medical appointments Has support from her two sons Communicates well with others  Patient Deficits:  Challenges in managing medical needs Pain issues Mobility issues  Patient Goals:   Attend scheduled medical appointments Take medications as prescribed Call RNCM or LCSW as needed for support Call medical provider as needed for information about managing client medical issues faced -  Follow Up Plan: LCSW to call client on 01/04/21 to assess client needs     Kelton Pillar.Darnelle Derrick MSW, LCSW Licensed Clinical Social Worker [facility] Care Management [phone]

## 2021-01-04 ENCOUNTER — Telehealth: Payer: Medicare HMO

## 2021-01-29 ENCOUNTER — Other Ambulatory Visit: Payer: Self-pay

## 2021-01-29 ENCOUNTER — Ambulatory Visit: Payer: Medicare HMO | Admitting: Orthopedic Surgery

## 2021-01-29 ENCOUNTER — Encounter: Payer: Self-pay | Admitting: Orthopedic Surgery

## 2021-01-29 VITALS — BP 127/71 | HR 66 | Ht 62.0 in | Wt 166.2 lb

## 2021-01-29 DIAGNOSIS — M1711 Unilateral primary osteoarthritis, right knee: Secondary | ICD-10-CM | POA: Diagnosis not present

## 2021-01-29 NOTE — Progress Notes (Signed)
Chief Complaint  Patient presents with  . Knee Pain    Right knee pain, Wants injection    Encounter Diagnosis  Name Primary?  . Primary osteoarthritis of right knee Yes    Procedure note right knee injection   verbal consent was obtained to inject right knee joint  Timeout was completed to confirm the site of injection  The medications used were 6 mg Celestone with lidocaine 1%Anesthesia was provided by ethyl chloride and the skin was prepped with alcohol.  After cleaning the skin with alcohol a 20-gauge needle was used to inject the right knee joint. There were no complications. A sterile bandage was applied.  Return 1 month patient considering right total knee  Note patient said her left knee was aching  She is using some topical medication  Examination showed a quiet knee joint full extension 120 degrees of flexion stable in coronal and sagittal planes no areas of swelling or tenderness

## 2021-01-29 NOTE — Patient Instructions (Signed)

## 2021-02-13 ENCOUNTER — Ambulatory Visit (INDEPENDENT_AMBULATORY_CARE_PROVIDER_SITE_OTHER): Payer: Medicare HMO | Admitting: Licensed Clinical Social Worker

## 2021-02-13 DIAGNOSIS — M170 Bilateral primary osteoarthritis of knee: Secondary | ICD-10-CM

## 2021-02-13 DIAGNOSIS — E119 Type 2 diabetes mellitus without complications: Secondary | ICD-10-CM | POA: Diagnosis not present

## 2021-02-13 DIAGNOSIS — E876 Hypokalemia: Secondary | ICD-10-CM

## 2021-02-13 DIAGNOSIS — I10 Essential (primary) hypertension: Secondary | ICD-10-CM | POA: Diagnosis not present

## 2021-02-13 DIAGNOSIS — K219 Gastro-esophageal reflux disease without esophagitis: Secondary | ICD-10-CM

## 2021-02-13 DIAGNOSIS — F5101 Primary insomnia: Secondary | ICD-10-CM

## 2021-02-13 NOTE — Patient Instructions (Signed)
Visit Information  PATIENT GOALS: Goals Addressed              This Visit's Progress   .  Manage medical needs faced and complete ADLs daily (pt-stated)        Timeframe:  Short-Term Goal Priority:  Medium Progress: On Track Start Date:        02/13/21                     Expected End Date:       05/15/21                Follow Up Date 03/28/21    Protect My Health (Patient) Manage health needs of client and manage daily activities as needed    Why is this important?    Screening tests can find diseases early when they are easier to treat.   Your doctor or nurse will talk with you about which tests are important for you.   Getting shots for common diseases like the flu and shingles will help prevent them.      Patient Strengths:  Takes medications as prescribed Attends scheduled medical appointments Has support from her two sons Communicates well with others  Patient Deficits:  Challenges in managing medical needs Pain issues Mobility issues  Patient Goals:   Attend scheduled medical appointments Take medications as prescribed Call RNCM or LCSW as needed for support Call medical provider as needed for information about managing client medical issues faced -  Follow Up Plan: LCSW to call client or son of client on  03/28/21 to assess client needs       Kelton Pillar.Patric Vanpelt MSW, LCSW Licensed Clinical Social Worker [facility] Care Management [phone]

## 2021-02-13 NOTE — Chronic Care Management (AMB) (Signed)
Chronic Care Management    Clinical Social Work Note  02/13/2021 Name: Norma Barton MRN: [id number] DOB: [birth year]  Norma Barton is a 86 y.o. year old female who is a primary care patient of [name], FNP. The CCM team was consulted to assist the patient with chronic disease management and/or care coordination needs related to: [name] .   Engaged with patient/ son of patient, Norma Barton by telephone for follow up visit in response to provider referral for social work chronic care management and care coordination services.   Consent to Services:  The patient was given information about Chronic Care Management services, agreed to services, and gave verbal consent prior to initiation of services.  Please see initial visit note for detailed documentation.   Patient agreed to services and consent obtained.   Assessment: Review of patient past medical history, allergies, medications, and health status, including review of relevant consultants reports was performed today as part of a comprehensive evaluation and provision of chronic care management and care coordination services.     SDOH (Social Determinants of Health) assessments and interventions performed:  SDOH Interventions   Flowsheet Row Most Recent Value  SDOH Interventions   Depression Interventions/Treatment  --  [informed client and her son, Norma Barton, previously about LCSW support and about RNCM support]       Advanced Directives Status: See Vynca application for related entries.  CCM Care Plan  No Known Allergies  Outpatient Encounter Medications as of 02/13/2021  Medication Sig  . Cholecalciferol (VITAMIN D3) 125 MCG (5000 UT) CAPS Take by mouth.  . furosemide (LASIX) 20 MG tablet Take 1 tablet (20 mg total) by mouth daily.  . metoprolol succinate (TOPROL-XL) 25 MG 24 hr tablet Take 1 tablet (25 mg total) by mouth daily.  Marland Kitchen omeprazole (PRILOSEC) 40 MG capsule Take 1 capsule (40 mg total) by  mouth daily.  . potassium chloride SA (KLOR-CON) 20 MEQ tablet Take 1 tablet (20 mEq total) by mouth daily.   No facility-administered encounter medications on file as of 02/13/2021.    Patient Active Problem List   Diagnosis Date Noted  . Diabetes mellitus without complication (HCC) 10/19/2019  . BMI 30.0-30.9,adult 01/12/2018  . Gastroesophageal reflux disease without esophagitis 01/12/2018  . Hypokalemia 01/12/2018  . Hypertension 04/12/2013  . Osteoarthritis of both knees 04/12/2013  . Insomnia 11/22/2012  . Cataract 08/04/2012    Conditions to be addressed/monitored: Monitor client completion of ADLs daily, as able   Care Plan : LCSW Care plan  Updates made by Norma Blakes, LCSW since 02/13/2021 12:00 AM    Problem: Coping Skills (General Plan of Care)     Goal: Manage medical needs of client and manage daily activities of client   Start Date: 02/13/2021  Expected End Date: 05/15/2021  This Visit's Progress: On track  Recent Progress: On track  Priority: Medium  Note:   Current barriers:   . Patient in need of assistance with connecting to community resources for assistance in managing ongoing client medical conditions . Patient is unable to independently navigate community resource options without care coordination support . Mobility issues . Pain issues  Clinical Goals:  patient will work with SW in next 30 days  to address concerns related to client management of health needs faced and related to client completion of daily ADLs Client to call RNCM or LCSW  as needed for support in next 30 days Client to talk with LCSW in next 30 days related to  mobility issues of client and related to pain issues of client   Clinical Interventions:  . Collaboration with [name], FNP regarding development and update of comprehensive plan of care as evidenced by provider attestation and co-signature . Talked  briefly with Norma Barton, son,  of client  . Offered  LCSW support for client through the CCM program  Patient Strengths:  Takes medications as prescribed Attends scheduled medical appointments Has support from her two sons Communicates well with others  Patient Deficits:  Challenges in managing medical needs Pain issues Mobility issues  Patient Goals:   Attend scheduled medical appointments Take medications as prescribed Call RNCM or LCSW as needed for support Call medical provider as needed for information about managing client medical issues faced -  Follow Up Plan: LCSW to call client  or son of client on 03/28/21 to assess client needs     Kelton Pillar.Amalie Koran MSW, LCSW Licensed Clinical Social Worker [facility] [place] Care Management [phone]

## 2021-03-01 ENCOUNTER — Other Ambulatory Visit: Payer: Self-pay

## 2021-03-01 ENCOUNTER — Ambulatory Visit: Payer: Medicare HMO | Admitting: Orthopedic Surgery

## 2021-03-01 ENCOUNTER — Ambulatory Visit: Payer: Medicare HMO

## 2021-03-01 ENCOUNTER — Encounter: Payer: Self-pay | Admitting: Orthopedic Surgery

## 2021-03-01 VITALS — BP 132/63 | HR 79 | Ht 62.0 in | Wt 160.0 lb

## 2021-03-01 DIAGNOSIS — M1711 Unilateral primary osteoarthritis, right knee: Secondary | ICD-10-CM

## 2021-03-01 MED ORDER — BUPIVACAINE-MELOXICAM ER 200-6 MG/7ML IJ SOLN: 400.0000 mg | Freq: Once | INTRAMUSCULAR | Status: DC

## 2021-03-01 NOTE — Addendum Note (Signed)
Addended byCaffie Damme on: 03/01/2021 02:15 PM   Modules accepted: Orders, SmartSet

## 2021-03-01 NOTE — Patient Instructions (Signed)
Your surgery will be at [facility] by Dr Harrison  The hospital will contact you with a preoperative appointment to discuss Anesthesia. Please bring your medications with you for the appointment. They will tell you the arrival time and medication instructions when you have your preoperative evaluation. Do not wear nail polish the day of your surgery and if you take Phentermine you need to stop this medication ONE WEEK prior to your surgery.  

## 2021-03-01 NOTE — Progress Notes (Signed)
FOLLOW UP   Encounter Diagnosis  Name Primary?   Primary osteoarthritis of right knee Yes     Chief Complaint  Patient presents with   Knee Pain    Right      86 status post left total knee considering right total knee, CHRONIC RIGHT KNEE PAIN, H/O PRIOR INJECTIONS WITH LIMITED SHORT TERM BENEFIT   Past Medical History:  Diagnosis Date   Hypertension    Past Surgical History:  Procedure Laterality Date   ABDOMINAL HYSTERECTOMY     arthroscopy right knee     TOTAL KNEE ARTHROPLASTY Left 08/15/2016   Procedure: TOTAL KNEE ARTHROPLASTY;  Surgeon: Vickki Hearing, MD;  Location: [facility];  Service: Orthopedics;  Laterality: Left;   Social History   Tobacco Use   Smoking status: Never   Smokeless tobacco: Never  Substance Use Topics   Alcohol use: No   Drug use: No   Family History  Problem Relation Age of Onset   Parkinson's disease Father    Breast cancer Sister     Current Outpatient Medications:    Cholecalciferol (VITAMIN D3) 125 MCG (5000 UT) CAPS, Take by mouth., Disp: , Rfl:    furosemide (LASIX) 20 MG tablet, Take 1 tablet (20 mg total) by mouth daily., Disp: 90 tablet, Rfl: 1   metoprolol succinate (TOPROL-XL) 25 MG 24 hr tablet, Take 1 tablet (25 mg total) by mouth daily., Disp: 90 tablet, Rfl: 1   omeprazole (PRILOSEC) 40 MG capsule, Take 1 capsule (40 mg total) by mouth daily., Disp: 90 capsule, Rfl: 1   potassium chloride SA (KLOR-CON) 20 MEQ tablet, Take 1 tablet (20 mEq total) by mouth daily., Disp: 90 tablet, Rfl: 1   BP 132/63   Pulse 79   Ht 5\' 2"  (1.575 m)   Wt 160 lb (72.6 kg)   BMI 29.26 kg/m   GAIT CANE NEEDED   Physical Exam Constitutional:      Appearance: Normal appearance.  Cardiovascular:     Rate and Rhythm: Normal rate and regular rhythm.  Musculoskeletal:     Right knee: Swelling, effusion and crepitus present. No erythema, ecchymosis or lacerations. Decreased range of motion. Tenderness present over the medial joint line. No  LCL laxity, MCL laxity, ACL laxity or PCL laxity. Abnormal alignment. Normal pulse.     Instability Tests: Anterior drawer test negative. Posterior drawer test negative.     Left knee: Normal.  Skin:    General: Skin is warm.  Neurological:     General: No focal deficit present.     Mental Status: She is alert and oriented to person, place, and time.     Gait: Gait abnormal.  Psychiatric:        Mood and Affect: Mood normal.   X-rays today show moderate varus deformity medial compartment grade 4 disease arthritis all 3 compartments normal patellar alignment  Patient is interested in knee replacement surgery she wants to get it done before she gets any older   The procedure has been fully reviewed with the patient; The risks and benefits of surgery have been discussed and explained and understood. Alternative treatment has also been reviewed, questions were encouraged and answered. The postoperative plan is also been reviewed.  Patient is ready for right total knee

## 2021-03-01 NOTE — Addendum Note (Signed)
Addended byCaffie Damme on: 03/01/2021 02:16 PM   Modules accepted: Orders, SmartSet

## 2021-03-25 ENCOUNTER — Encounter (HOSPITAL_COMMUNITY): Payer: Self-pay | Admitting: Anesthesiology

## 2021-03-27 ENCOUNTER — Telehealth: Payer: Self-pay | Admitting: Nurse Practitioner

## 2021-03-28 ENCOUNTER — Other Ambulatory Visit: Payer: Self-pay | Admitting: Orthopedic Surgery

## 2021-03-28 ENCOUNTER — Ambulatory Visit (INDEPENDENT_AMBULATORY_CARE_PROVIDER_SITE_OTHER): Payer: Medicare HMO | Admitting: Licensed Clinical Social Worker

## 2021-03-28 DIAGNOSIS — M1711 Unilateral primary osteoarthritis, right knee: Secondary | ICD-10-CM

## 2021-03-28 DIAGNOSIS — M170 Bilateral primary osteoarthritis of knee: Secondary | ICD-10-CM

## 2021-03-28 DIAGNOSIS — I10 Essential (primary) hypertension: Secondary | ICD-10-CM | POA: Diagnosis not present

## 2021-03-28 DIAGNOSIS — E876 Hypokalemia: Secondary | ICD-10-CM

## 2021-03-28 DIAGNOSIS — E119 Type 2 diabetes mellitus without complications: Secondary | ICD-10-CM | POA: Diagnosis not present

## 2021-03-28 DIAGNOSIS — K219 Gastro-esophageal reflux disease without esophagitis: Secondary | ICD-10-CM

## 2021-03-28 DIAGNOSIS — F5101 Primary insomnia: Secondary | ICD-10-CM

## 2021-03-28 NOTE — Patient Instructions (Signed)
Visit Information  PATIENT GOALS:  Goals Addressed               This Visit's Progress     Manage medical needs faced and complete ADLs daily (pt-stated)        Timeframe:  Short-Term Goal Priority:  Medium Progress: On Track Start Date:        03/28/21                     Expected End Date:       06/19/21                Follow Up Date 05/08/21   Protect My Health (Patient) Manage health needs of client and manage daily activities as needed    Why is this important?   Screening tests can find diseases early when they are easier to treat.  Your doctor or nurse will talk with you about which tests are important for you.  Getting shots for common diseases like the flu and shingles will help prevent them.      Patient Strengths:  Takes medications as prescribed Attends scheduled medical appointments Has support from her two sons Communicates well with others  Patient Deficits:  Challenges in managing medical needs Pain issues Mobility issues  Patient Goals:   Attend scheduled medical appointments Take medications as prescribed Call RNCM or LCSW as needed for support Call medical provider as needed for information about managing client medical issues faced -  Follow Up Plan: LCSW to call client or son of client on  05/08/21 to assess client needs     Kelton Pillar.Kitti Mcclish MSW, LCSW Licensed Clinical Social Worker [facility] Care Management [phone]

## 2021-03-28 NOTE — Chronic Care Management (AMB) (Signed)
Chronic Care Management    Clinical Social Work Note  03/28/2021 Name: Norma Barton MRN: [id number] DOB: 03-26-1935  Norma Barton is a 86 y.o. year old female who is a primary care patient of [name], FNP. The CCM team was consulted to assist the patient with chronic disease management and/or care coordination needs related to: [name] .   Engaged with patient by telephone for follow up visit in response to provider referral for social work chronic care management and care coordination services.   Consent to Services:  The patient was given information about Chronic Care Management services, agreed to services, and gave verbal consent prior to initiation of services.  Please see initial visit note for detailed documentation.   Patient agreed to services and consent obtained.   Assessment: Review of patient past medical history, allergies, medications, and health status, including review of relevant consultants reports was performed today as part of a comprehensive evaluation and provision of chronic care management and care coordination services.     SDOH (Social Determinants of Health) assessments and interventions performed:  SDOH Interventions    Flowsheet Row Most Recent Value  SDOH Interventions   Depression Interventions/Treatment  --  [informed client of LCSW support and of RNCM support]        Advanced Directives Status: See Vynca application for related entries.  CCM Care Plan  No Known Allergies  Outpatient Encounter Medications as of 03/28/2021  Medication Sig   Cholecalciferol (VITAMIN D3) 125 MCG (5000 UT) CAPS Take by mouth.   furosemide (LASIX) 20 MG tablet Take 1 tablet (20 mg total) by mouth daily.   metoprolol succinate (TOPROL-XL) 25 MG 24 hr tablet Take 1 tablet (25 mg total) by mouth daily.   omeprazole (PRILOSEC) 40 MG capsule Take 1 capsule (40 mg total) by mouth daily.   potassium chloride SA (KLOR-CON) 20 MEQ tablet Take  1 tablet (20 mEq total) by mouth daily.   Facility-Administered Encounter Medications as of 03/28/2021  Medication   bupivacaine-meloxicam ER (ZYNRELEF) injection 400 mg    Patient Active Problem List   Diagnosis Date Noted   Diabetes mellitus without complication (HCC) 10/19/2019   BMI 30.0-30.9,adult 01/12/2018   Gastroesophageal reflux disease without esophagitis 01/12/2018   Hypokalemia 01/12/2018   Hypertension 04/12/2013   Osteoarthritis of both knees 04/12/2013   Insomnia 11/22/2012   Cataract 08/04/2012    Conditions to be addressed/monitored: Monitor client management of medical conditions.Monitor client completion of ADLs   Care Plan : LCSW Care plan  Updates made by Norma Blakes, LCSW since 03/28/2021 12:00 AM     Problem: Coping Skills (General Plan of Care)         Start Date: 03/28/2021  Expected End Date: 06/19/2021  This Visit's Progress: On track  Recent Progress: On track  Priority: Medium  Note:   Current barriers:   Patient in need of assistance with connecting to community resources for assistance in managing ongoing client medical conditions Patient is unable to independently navigate community resource options without care coordination support Mobility issues Pain issues  Clinical Goals:  patient will work with SW in next 30 days  to address concerns related to client management of health needs faced and related to client completion of daily ADLs Client to call RNCM or LCSW  as needed for support in next 30 days Client to talk with LCSW in next 30 days related to mobility issues of client and related to pain issues of client  Clinical Interventions:  Collaboration with [name], FNP regarding development and update of comprehensive plan of care as evidenced by provider attestation and co-signature Talked with Norma Barton about her upcoming knee surgery (right knee) on April 17, 2021  Talked with Norma Barton about pain issues of her right  knee Talked with Norma Barton about sleeping challenges of client Talked with Norma Barton about support from son, Norma Barton Talked with Norma Barton about medication procurement for client Talked with Norma Barton about upcoming client medical appointments Talked with Norma Barton about appetite of client Talked with Norma Barton about social support (talks with sister via phone, talks with sons via phone) Talked with Norma Barton about transport needs of client Talked with Norma Barton about her previous knee surgery on her left knee Talked with Norma Barton about difficulty in walking (uses a walker or cane as needed to help her walk) Talked with client about vision of client Encouraged client to call RNCM as needed for nursing support Talked with client about her support from orthopedist. Dr. [name] Talked with Norma Link, LPN at [facility] related to nursing needs of client  Patient Strengths:  Takes medications as prescribed Attends scheduled medical appointments Has support from her two sons Communicates well with others  Patient Deficits:  Challenges in managing medical needs Pain issues Mobility issues  Patient Goals:   Attend scheduled medical appointments Take medications as prescribed Call RNCM or LCSW as needed for support Call medical provider as needed for information about managing client medical issues faced -  Follow Up Plan: LCSW to call client  or son of client on 05/08/21 to assess client needs     Norma Barton.Adaleen Hulgan MSW, LCSW Licensed Clinical Social Worker [facility] Care Management [phone]

## 2021-03-29 ENCOUNTER — Telehealth: Payer: Self-pay | Admitting: Orthopedic Surgery

## 2021-03-29 NOTE — Telephone Encounter (Signed)
Insurance approved the surgery for 23 hours only. Placement in a rehab facility is done from hospital, if she meets guidelines.. I called her to advise. Left message for her to call back

## 2021-03-29 NOTE — Telephone Encounter (Signed)
Norma Barton called and has questions regarding going to a rehab facility after her surgery.  She wants to know who sets this up and how long she may have to be there.  Please call her  Thanks

## 2021-03-29 NOTE — Telephone Encounter (Signed)
I called her, we discussed, she has voiced understanding.

## 2021-03-29 NOTE — Telephone Encounter (Signed)
Ms. Kosta called back and I told her what you said.  She asks when you get a minute to please call her either today or tomorrow  Thanks again Amy

## 2021-04-11 ENCOUNTER — Other Ambulatory Visit: Payer: Self-pay | Admitting: Orthopedic Surgery

## 2021-04-11 DIAGNOSIS — M1711 Unilateral primary osteoarthritis, right knee: Secondary | ICD-10-CM

## 2021-04-12 ENCOUNTER — Other Ambulatory Visit: Payer: Self-pay | Admitting: Orthopedic Surgery

## 2021-04-12 NOTE — Patient Instructions (Signed)
Norma Barton  04/12/2021     @   Your procedure is scheduled on  04/17/2021.   Report to [facility] at  [phone]  A.M.   Call this number if you have problems the morning of surgery:  [phone]   Remember:  Do not eat or drink after midnight.      Take these medicines the morning of surgery with A SIP OF WATER                                  metoprolol, prilosec.     Do not wear jewelry, make-up or nail polish.  Do not wear lotions, powders, or perfumes, or deodorant.  Do not shave 48 hours prior to surgery.  Men may shave face and neck.  Do not bring valuables to the hospital.  [facility] is not responsible for any belongings or valuables.  Contacts, dentures or bridgework may not be worn into surgery.  Leave your suitcase in the car.  After surgery it may be brought to your room.  For patients admitted to the hospital, discharge time will be determined by your treatment team.  Patients discharged the day of surgery will not be allowed to drive home and must  have someone with them for 24 hours.    Special instructions:    DO NOT smoke tobacco or vape for 24 hours before your procedure.  Please read over the following fact sheets that you were given. Pain Booklet, Coughing and Deep Breathing, Blood Transfusion Information, Total Joint Packet, Surgical Site Infection Prevention, Anesthesia Post-op Instructions, and Care and Recovery After Surgery      Total Knee Replacement, Care After This sheet gives you information about how to care for yourself after your procedure. Your health care provider may also give you more specific instructions. If you have problems or questions, contact your health careprovider. What can I expect after the procedure? After the procedure, it is common to have: Redness, pain, and swelling at the incision area. Stiffness. Discomfort. A small amount of blood or clear fluid coming from your  incision. Follow these instructions at home: Medicines Take over-the-counter and prescription medicines only as told by your health care provider. If you were prescribed a blood thinner (anticoagulant), take it as told by your health care provider. Ask your health care provider if the medicine prescribed to you: Requires you to avoid driving or using machinery. Can cause constipation. You may need to take these actions to prevent or treat constipation: Drink enough fluid to keep your urine pale yellow. Take over-the-counter or prescription medicines. Eat foods that are high in fiber, such as beans, whole grains, and fresh fruits and vegetables. Limit foods that are high in fat and processed sugars, such as fried or sweet foods. Incision care  Follow instructions from your health care provider about how to take care of your incision. Make sure you: Wash your hands with soap and water for at least 20 seconds before and after you change your bandage (dressing). If soap and water are not available, use hand sanitizer. Change your dressing as told by your health care provider. Leave stitches (sutures), staples, skin glue, or adhesive strips in place. These skin closures may need to stay in place for 2 weeks or longer. If adhesive strip edges start to loosen and curl up, you may trim the  loose edges. Do not remove adhesive strips completely unless your health care provider tells you to do that. Do not take baths, swim, or use a hot tub until your health care provider approves. Check your incision area every day for signs of infection. Check for: More redness, swelling, or pain. More fluid or blood. Warmth. Pus or a bad smell.  Activity Rest as told by your health care provider. Avoid sitting for a long time without moving. Get up to take short walks every 1-2 hours. This is important to improve blood flow and breathing. Ask for help if you feel weak or unsteady. Follow instructions from your  health care provider about using a walker, crutches, or a cane. You may use your legs to support (bear) your body weight as told by your health care provider. Follow instructions about how much weight you may safely support on your affected leg (weight-bearing restrictions). A physical therapist may show you how to get out of a bed and chair and how to go up and down stairs. You will first do this with a walker, crutches, or a cane and then without any of these devices. Once you are able to walk without a limp, you may stop using a walker, crutches, or a cane. Do exercises as told by your health care provider or physical therapist. Avoid high-impact activities, including running, jumping rope, and doing jumping jacks. Do not play contact sports until your health care provider approves. Return to your normal activities as told by your health care provider. Ask your health care provider what activities are safe for you. Managing pain, stiffness, and swelling  If directed, put ice on your knee. To do this: Put ice in a plastic bag or use the icing device (cold flow pad) that you were given. Follow instructions from your health care provider about how to use the icing device. Place a towel between your skin and the bag or between your skin and the icing device. Leave the ice on for 20 minutes, 2-3 times a day. Remove the ice if your skin turns bright red. This is very important. If you cannot feel pain, heat, or cold, you have a greater risk of damage to the area. Move your toes often to reduce stiffness and swelling. Raise (elevate) your leg above the level of your heart while you are sitting or lying down. Use several pillows to keep your leg straight. Do not put a pillow just under the knee. If the knee is bent for a long time, this may lead to stiffness. Wear elastic knee support as told by your health care provider.  Safety  To help prevent falls, keep floors clear of objects you may trip  over. Place items that you may need within easy reach. Wear an apron or tool belt with pockets for carrying objects. This leaves your hands free to help with your balance. Ask your health care provider when it is safe to drive.  General instructions Wear compression stockings as told by your health care provider. These stockings help to prevent blood clots and reduce swelling in your legs. Continue with breathing exercises. This helps prevent lung infection. Do not use any products that contain nicotine or tobacco. These products include cigarettes, chewing tobacco, and vaping devices, such as e-cigarettes. These can delay healing after surgery. If you need help quitting, ask your health care provider. Tell your health care provider if you plan to have dental work. Also: Tell your dentist about your joint replacement.  Ask your health care provider if there are any special instructions you need to follow before having dental care and routine cleanings. Keep all follow-up visits. This is important. Contact a health care provider if: You have a fever or chills. You have a cough or feel short of breath. Your medicine is not controlling your pain. You have any of these signs of infection: More redness, swelling, or pain around your incision. More fluid or blood coming from your incision. Warmth coming from your incision. Pus or a bad smell coming from your incision. You fall. Get help right away if: You have severe pain. You have trouble breathing. You have chest pain. You have redness, swelling, pain, or warmth in your calf or leg. Your incision breaks open after sutures or staples are removed. These symptoms may represent a serious problem that is an emergency. Do not wait to see if the symptoms will go away. Get medical help right away. Call your local emergency services (911 in the [place] Do not drive yourself to the hospital. Summary After the procedure, it is common to have pain and  swelling at the incision area, a small amount of blood or fluid coming from your incision, and stiffness. Follow instructions from your health care provider about how to take care of your incision. Use crutches, a walker, or a cane as told by your health care provider. This information is not intended to replace advice given to you by your health care provider. Make sure you discuss any questions you have with your healthcare provider. Document Revised: 02/22/2020 Document Reviewed: 02/22/2020 Elsevier Patient Education  2022 [employer] General Anesthesia, Adult, Care After This sheet gives you information about how to care for yourself after your procedure. Your health care provider may also give you more specific instructions. If you have problems or questions, contact your health careprovider. What can I expect after the procedure? After the procedure, the following side effects are common: Pain or discomfort at the IV site. Nausea. Vomiting. Sore throat. Trouble concentrating. Feeling cold or chills. Feeling weak or tired. Sleepiness and fatigue. Soreness and body aches. These side effects can affect parts of the body that were not involved in surgery. Follow these instructions at home: For the time period you were told by your health care provider:  Rest. Do not participate in activities where you could fall or become injured. Do not drive or use machinery. Do not drink alcohol. Do not take sleeping pills or medicines that cause drowsiness. Do not make important decisions or sign legal documents. Do not take care of children on your own.  Eating and drinking Follow any instructions from your health care provider about eating or drinking restrictions. When you feel hungry, start by eating small amounts of foods that are soft and easy to digest (bland), such as toast. Gradually return to your regular diet. Drink enough fluid to keep your urine pale yellow. If you vomit,  rehydrate by drinking water, juice, or clear broth. General instructions If you have sleep apnea, surgery and certain medicines can increase your risk for breathing problems. Follow instructions from your health care provider about wearing your sleep device: Anytime you are sleeping, including during daytime naps. While taking prescription pain medicines, sleeping medicines, or medicines that make you drowsy. Have a responsible adult stay with you for the time you are told. It is important to have someone help care for you until you are awake and alert. Return to your normal activities as told  by your health care provider. Ask your health care provider what activities are safe for you. Take over-the-counter and prescription medicines only as told by your health care provider. If you smoke, do not smoke without supervision. Keep all follow-up visits as told by your health care provider. This is important. Contact a health care provider if: You have nausea or vomiting that does not get better with medicine. You cannot eat or drink without vomiting. You have pain that does not get better with medicine. You are unable to pass urine. You develop a skin rash. You have a fever. You have redness around your IV site that gets worse. Get help right away if: You have difficulty breathing. You have chest pain. You have blood in your urine or stool, or you vomit blood. Summary After the procedure, it is common to have a sore throat or nausea. It is also common to feel tired. Have a responsible adult stay with you for the time you are told. It is important to have someone help care for you until you are awake and alert. When you feel hungry, start by eating small amounts of foods that are soft and easy to digest (bland), such as toast. Gradually return to your regular diet. Drink enough fluid to keep your urine pale yellow. Return to your normal activities as told by your health care provider. Ask your  health care provider what activities are safe for you. This information is not intended to replace advice given to you by your health care provider. Make sure you discuss any questions you have with your healthcare provider. Document Revised: 05/18/2020 Document Reviewed: 12/16/2019 Elsevier Patient Education  2022 [employer] Spinal Anesthesia and Epidural Anesthesia, Care After This sheet gives you information about how to care for yourself after your procedure. Your doctor may also give you more specific instructions. If youhave problems or questions, contact your doctor. What can I expect after the procedure? While the medicines you were given are still having effects, it is common to: Feel like you may vomit (nauseous). Vomit. Have a numb feeling or tingling in your legs. Have problems when you pee (urinate). Feel itchy. Follow these instructions at home: For the time period you were told by your doctor:  Rest. Do not do activities where you could fall or get hurt. Do not drive or use machines. Do not drink alcohol. Do not take sleeping pills or medicines that make you drowsy. Do not make big decisions or sign legal documents. Do not take care of children on your own.  Eating and drinking If you vomit, wait a short time. When you can drink without vomiting, try to drink some water, juice, or clear soup. Drink enough fluid to keep your pee (urine) pale yellow. Make sure you do not feel like vomiting before you eat solid foods. Follow the diet that your doctor recommends. General instructions If you have sleep apnea, surgery and certain medicines can raise your risk for breathing problems. Follow instructions from your doctor about when to wear your sleep device. Have a responsible adult stay with you for the time you are told. It is important to have someone help care for you until you are awake and alert. Return to your normal activities as told by your doctor. Ask your doctor  what activities are safe for you. Take over-the-counter and prescription medicines only as told by your doctor. Do not use any products that contain nicotine or tobacco, such as cigarettes, e-cigarettes, and chewing  tobacco. If you need help quitting, ask your doctor. Keep all follow-up visits as told by your doctor. This is important. Contact a doctor if: It has been more than one day since your procedure and you feel like you may vomit or you are vomiting. You have a rash. Get help right away if: You have a fever. You have a headache that lasts a long time. You have a very bad headache. Your vision is blurry or you see two of a single object (double vision). You are dizzy or light-headed. You faint. Your arms or legs tingle, feel weak, or get numb. You have trouble breathing. You cannot pee. These symptoms may be an emergency. Get help right away. Call your local emergency services (911 in the [place] Do not wait to see if the symptoms will go away. Do not drive yourself to the hospital. Summary After the procedure, have a responsible adult stay with you at home. Do not do activities that might cause you to get hurt. Do not drive, use machinery, drink alcohol, or make important decisions as told by your doctor. Do not use products that contain nicotine or tobacco. Get help right away if you have a very bad headache, trouble breathing, or you cannot pee. This information is not intended to replace advice given to you by your health care provider. Make sure you discuss any questions you have with your healthcare provider. Document Revised: 05/18/2020 Document Reviewed: 10/21/2019 Elsevier Patient Education  2022 [employer] Inc. How to Use Chlorhexidine for Bathing Chlorhexidine gluconate (CHG) is a germ-killing (antiseptic) solution that is used to clean the skin. It can get rid of the bacteria that normally live on the skin and can keep them away for about 24 hours. To clean your skin  with CHG, you may be given: A CHG solution to use in the shower or as part of a sponge bath. A prepackaged cloth that contains CHG. Cleaning your skin with CHG may help lower the risk for infection: While you are staying in the intensive care unit of the hospital. If you have a vascular access, such as a central line, to provide short-term or long-term access to your veins. If you have a catheter to drain urine from your bladder. If you are on a ventilator. A ventilator is a machine that helps you breathe by moving air in and out of your lungs. After surgery. What are the risks? Risks of using CHG include: A skin reaction. Hearing loss, if CHG gets in your ears. Eye injury, if CHG gets in your eyes and is not rinsed out. The CHG product catching fire. Make sure that you avoid smoking and flames after applying CHG to your skin. Do not use CHG: If you have a chlorhexidine allergy or have previously reacted to chlorhexidine. On babies younger than 14 months of age. How to use CHG solution Use CHG only as told by your health care provider, and follow the instructions on the label. Use the full amount of CHG as directed. Usually, this is one bottle. During a shower Follow these steps when using CHG solution during a shower (unless your health care provider gives you different instructions): Start the shower. Use your normal soap and shampoo to wash your face and hair. Turn off the shower or move out of the shower stream. Pour the CHG onto a clean washcloth. Do not use any type of brush or rough-edged sponge. Starting at your neck, lather your body down to your toes. Make  sure you follow these instructions: If you will be having surgery, pay special attention to the part of your body where you will be having surgery. Scrub this area for at least 1 minute. Do not use CHG on your head or face. If the solution gets into your ears or eyes, rinse them well with water. Avoid your genital  area. Avoid any areas of skin that have broken skin, cuts, or scrapes. Scrub your back and under your arms. Make sure to wash skin folds. Let the lather sit on your skin for 1-2 minutes or as long as told by your health care provider. Thoroughly rinse your entire body in the shower. Make sure that all body creases and crevices are rinsed well. Dry off with a clean towel. Do not put any substances on your body afterward--such as powder, lotion, or perfume--unless you are told to do so by your health care provider. Only use lotions that are recommended by the manufacturer. Put on clean clothes or pajamas. If it is the night before your surgery, sleep in clean sheets.  During a sponge bath Follow these steps when using CHG solution during a sponge bath (unless your health care provider gives you different instructions): Use your normal soap and shampoo to wash your face and hair. Pour the CHG onto a clean washcloth. Starting at your neck, lather your body down to your toes. Make sure you follow these instructions: If you will be having surgery, pay special attention to the part of your body where you will be having surgery. Scrub this area for at least 1 minute. Do not use CHG on your head or face. If the solution gets into your ears or eyes, rinse them well with water. Avoid your genital area. Avoid any areas of skin that have broken skin, cuts, or scrapes. Scrub your back and under your arms. Make sure to wash skin folds. Let the lather sit on your skin for 1-2 minutes or as long as told by your health care provider. Using a different clean, wet washcloth, thoroughly rinse your entire body. Make sure that all body creases and crevices are rinsed well. Dry off with a clean towel. Do not put any substances on your body afterward--such as powder, lotion, or perfume--unless you are told to do so by your health care provider. Only use lotions that are recommended by the manufacturer. Put on clean  clothes or pajamas. If it is the night before your surgery, sleep in clean sheets. How to use CHG prepackaged cloths Only use CHG cloths as told by your health care provider, and follow the instructions on the label. Use the CHG cloth on clean, dry skin. Do not use the CHG cloth on your head or face unless your health care provider tells you to. When washing with the CHG cloth: Avoid your genital area. Avoid any areas of skin that have broken skin, cuts, or scrapes. Before surgery Follow these steps when using a CHG cloth to clean before surgery (unless your health care provider gives you different instructions): Using the CHG cloth, vigorously scrub the part of your body where you will be having surgery. Scrub using a back-and-forth motion for 3 minutes. The area on your body should be completely wet with CHG when you are done scrubbing. Do not rinse. Discard the cloth and let the area air-dry. Do not put any substances on the area afterward, such as powder, lotion, or perfume. Put on clean clothes or pajamas. If it is the  night before your surgery, sleep in clean sheets.  For general bathing Follow these steps when using CHG cloths for general bathing (unless your health care provider gives you different instructions). Use a separate CHG cloth for each area of your body. Make sure you wash between any folds of skin and between your fingers and toes. Wash your body in the following order, switching to a new cloth after each step: The front of your neck, shoulders, and chest. Both of your arms, under your arms, and your hands. Your stomach and groin area, avoiding the genitals. Your right leg and foot. Your left leg and foot. The back of your neck, your back, and your buttocks. Do not rinse. Discard the cloth and let the area air-dry. Do not put any substances on your body afterward--such as powder, lotion, or perfume--unless you are told to do so by your health care provider. Only use  lotions that are recommended by the manufacturer. Put on clean clothes or pajamas. Contact a health care provider if: Your skin gets irritated after scrubbing. You have questions about using your solution or cloth. Get help right away if: Your eyes become very red or swollen. Your eyes itch badly. Your skin itches badly and is red or swollen. Your hearing changes. You have trouble seeing. You have swelling or tingling in your mouth or throat. You have trouble breathing. You swallow any chlorhexidine. Summary Chlorhexidine gluconate (CHG) is a germ-killing (antiseptic) solution that is used to clean the skin. Cleaning your skin with CHG may help to lower your risk for infection. You may be given CHG to use for bathing. It may be in a bottle or in a prepackaged cloth to use on your skin. Carefully follow your health care provider's instructions and the instructions on the product label. Do not use CHG if you have a chlorhexidine allergy. Contact your health care provider if your skin gets irritated after scrubbing. This information is not intended to replace advice given to you by your health care provider. Make sure you discuss any questions you have with your healthcare provider. Document Revised: 01/14/2020 Document Reviewed: 02/18/2020 Elsevier Patient Education  2022 [employer].

## 2021-04-13 ENCOUNTER — Encounter (HOSPITAL_COMMUNITY)
Admission: RE | Admit: 2021-04-13 | Discharge: 2021-04-13 | Disposition: A | Discharge status: Home / Self Care | Payer: Medicare HMO | Source: Ambulatory Visit | Attending: Orthopedic Surgery | Admitting: Orthopedic Surgery

## 2021-04-13 ENCOUNTER — Encounter (HOSPITAL_COMMUNITY): Payer: Self-pay

## 2021-04-13 ENCOUNTER — Other Ambulatory Visit: Payer: Self-pay

## 2021-04-13 ENCOUNTER — Telehealth: Payer: Self-pay

## 2021-04-13 ENCOUNTER — Other Ambulatory Visit (HOSPITAL_COMMUNITY)
Admission: RE | Admit: 2021-04-13 | Discharge: 2021-04-13 | Disposition: A | Discharge status: Home / Self Care | Payer: Medicare HMO | Source: Ambulatory Visit | Attending: Orthopedic Surgery | Admitting: Orthopedic Surgery

## 2021-04-13 DIAGNOSIS — Z01818 Encounter for other preprocedural examination: Secondary | ICD-10-CM | POA: Insufficient documentation

## 2021-04-13 DIAGNOSIS — Z20822 Contact with and (suspected) exposure to covid-19: Secondary | ICD-10-CM | POA: Diagnosis not present

## 2021-04-13 DIAGNOSIS — G8929 Other chronic pain: Secondary | ICD-10-CM

## 2021-04-13 DIAGNOSIS — I4891 Unspecified atrial fibrillation: Secondary | ICD-10-CM

## 2021-04-13 DIAGNOSIS — M25561 Pain in right knee: Secondary | ICD-10-CM

## 2021-04-13 HISTORY — DX: Gastro-esophageal reflux disease without esophagitis: K21.9

## 2021-04-13 HISTORY — DX: Type 2 diabetes mellitus without complications: E11.9

## 2021-04-13 LAB — PREPARE RBC (CROSSMATCH)

## 2021-04-13 LAB — CBC WITH DIFFERENTIAL/PLATELET
Abs Immature Granulocytes: 0.01 10*3/uL (ref 0.00–0.07)
Basophils Absolute: 0.1 10*3/uL (ref 0.0–0.1)
Basophils Relative: 1 %
Eosinophils Absolute: 0.2 10*3/uL (ref 0.0–0.5)
Eosinophils Relative: 3 %
HCT: 46.8 % — ABNORMAL HIGH (ref 36.0–46.0)
Hemoglobin: 15.1 g/dL — ABNORMAL HIGH (ref 12.0–15.0)
Immature Granulocytes: 0 %
Lymphocytes Relative: 46 %
Lymphs Abs: 2.6 10*3/uL (ref 0.7–4.0)
MCH: 32.7 pg (ref 26.0–34.0)
MCHC: 32.3 g/dL (ref 30.0–36.0)
MCV: 101.3 fL — ABNORMAL HIGH (ref 80.0–100.0)
Monocytes Absolute: 0.5 10*3/uL (ref 0.1–1.0)
Monocytes Relative: 10 %
Neutro Abs: 2.3 10*3/uL (ref 1.7–7.7)
Neutrophils Relative %: 40 %
Platelets: 135 10*3/uL — ABNORMAL LOW (ref 150–400)
RBC: 4.62 MIL/uL (ref 3.87–5.11)
RDW: 12.8 % (ref 11.5–15.5)
WBC: 5.7 10*3/uL (ref 4.0–10.5)
nRBC: 0 % (ref 0.0–0.2)

## 2021-04-13 LAB — BASIC METABOLIC PANEL
Anion gap: 10 (ref 5–15)
BUN: 17 mg/dL (ref 8–23)
CO2: 27 mmol/L (ref 22–32)
Calcium: 9.2 mg/dL (ref 8.9–10.3)
Chloride: 104 mmol/L (ref 98–111)
Creatinine, Ser: 1.05 mg/dL — ABNORMAL HIGH (ref 0.44–1.00)
GFR, Estimated: 52 mL/min — ABNORMAL LOW (ref >60)
Glucose, Bld: 85 mg/dL (ref 70–99)
Potassium: 3.7 mmol/L (ref 3.5–5.1)
Sodium: 141 mmol/L (ref 135–145)

## 2021-04-13 LAB — SARS CORONAVIRUS 2 (TAT 6-24 HRS): SARS Coronavirus 2: NEGATIVE

## 2021-04-13 NOTE — Telephone Encounter (Signed)
Call received from [facility] on this pt attempting to do her pre op. During appointment pt was in A-Fib, pt given an appointment to follow up at [facility] in [place] but pt is in need of a referral. Referral placed at this time.

## 2021-04-17 ENCOUNTER — Ambulatory Visit (HOSPITAL_COMMUNITY): Admission: RE | Admit: 2021-04-17 | Payer: Medicare HMO | Source: Home / Self Care | Admitting: Orthopedic Surgery

## 2021-04-17 ENCOUNTER — Telehealth: Payer: Self-pay | Admitting: Cardiology

## 2021-04-17 ENCOUNTER — Encounter (HOSPITAL_COMMUNITY): Admission: RE | Payer: Self-pay | Source: Home / Self Care

## 2021-04-17 ENCOUNTER — Ambulatory Visit: Payer: Medicare HMO | Admitting: Cardiology

## 2021-04-17 ENCOUNTER — Encounter: Payer: Self-pay | Admitting: Cardiology

## 2021-04-17 VITALS — BP 104/68 | HR 65 | Ht 62.0 in | Wt 163.2 lb

## 2021-04-17 DIAGNOSIS — I4892 Unspecified atrial flutter: Secondary | ICD-10-CM

## 2021-04-17 SURGERY — ARTHROPLASTY, KNEE, TOTAL
Anesthesia: Choice | Site: Knee | Laterality: Right

## 2021-04-17 MED ORDER — APIXABAN 5 MG PO TABS
5.0000 mg | ORAL_TABLET | Freq: Two times a day (BID) | ORAL | 6 refills | Status: DC
Start: 2021-04-17 — End: 2021-11-02

## 2021-04-17 MED ORDER — APIXABAN 5 MG PO TABS: 5.0000 mg | ORAL_TABLET | Freq: Two times a day (BID) | ORAL | 0 refills | Status: DC

## 2021-04-17 NOTE — Progress Notes (Addendum)
Clinical Summary Norma Barton is a 86 y.o.female seen today as a new consult, referred by NP Daphine Deutscher for the following medical problems.   1.Aflutter - new diagnosis, noted during 03/2021 preop evaluation for knee replacement - no palpitations     2. Preop evaluation - plans for knee replacement   Past Medical History:  Diagnosis Date   Diabetes mellitus without complication (HCC)    GERD (gastroesophageal reflux disease)    Hypertension      No Known Allergies   Current Outpatient Medications  Medication Sig Dispense Refill   cholecalciferol (VITAMIN D) 25 MCG (1000 UNIT) tablet Take 2,000 Units by mouth in the morning.     Cholecalciferol (VITAMIN D3) 125 MCG (5000 UT) CAPS Take by mouth.     furosemide (LASIX) 20 MG tablet Take 1 tablet (20 mg total) by mouth daily. (Patient taking differently: Take 20 mg by mouth in the morning.) 90 tablet 1   metoprolol succinate (TOPROL-XL) 25 MG 24 hr tablet Take 1 tablet (25 mg total) by mouth daily. (Patient taking differently: Take 25 mg by mouth in the morning.) 90 tablet 1   naproxen sodium (ALEVE) 220 MG tablet Take 220 mg by mouth daily as needed (knee pain.).     omeprazole (PRILOSEC) 40 MG capsule Take 1 capsule (40 mg total) by mouth daily. (Patient taking differently: Take 40 mg by mouth in the morning.) 90 capsule 1   potassium chloride SA (KLOR-CON) 20 MEQ tablet Take 1 tablet (20 mEq total) by mouth daily. (Patient taking differently: Take 20 mEq by mouth in the morning.) 90 tablet 1   Current Facility-Administered Medications  Medication Dose Route Frequency Provider Last Rate Last Admin   bupivacaine-meloxicam ER (ZYNRELEF) injection 400 mg  400 mg Infiltration Once Vickki Hearing, MD         Past Surgical History:  Procedure Laterality Date   ABDOMINAL HYSTERECTOMY     arthroscopy right knee     TOTAL KNEE ARTHROPLASTY Left 08/15/2016   Procedure: TOTAL KNEE ARTHROPLASTY;  Surgeon: Vickki Hearing,  MD;  Location: [facility];  Service: Orthopedics;  Laterality: Left;     No Known Allergies    Family History  Problem Relation Age of Onset   Parkinson's disease Father    Breast cancer Sister      Social History Norma Barton reports that she has never smoked. She has never used smokeless tobacco. Norma Barton reports no history of alcohol use.   Review of Systems CONSTITUTIONAL: No weight loss, fever, chills, weakness or fatigue.  HEENT: Eyes: No visual loss, blurred vision, double vision or yellow sclerae.No hearing loss, sneezing, congestion, runny nose or sore throat.  SKIN: No rash or itching.  CARDIOVASCULAR: per hpi RESPIRATORY: No shortness of breath, cough or sputum.  GASTROINTESTINAL: No anorexia, nausea, vomiting or diarrhea. No abdominal pain or blood.  GENITOURINARY: No burning on urination, no polyuria NEUROLOGICAL: No headache, dizziness, syncope, paralysis, ataxia, numbness or tingling in the extremities. No change in bowel or bladder control.  MUSCULOSKELETAL: No muscle, back pain, joint pain or stiffness.  LYMPHATICS: No enlarged nodes. No history of splenectomy.  PSYCHIATRIC: No history of depression or anxiety.  ENDOCRINOLOGIC: No reports of sweating, cold or heat intolerance. No polyuria or polydipsia.  Marland Kitchen   Physical Examination Today's Vitals   04/17/21 0907  BP: 104/68  Pulse: 65  SpO2: 97%  Weight: 163 lb 3.2 oz (74 kg)  Height: 5\' 2"  (1.575 m)  Body mass index is 29.85 kg/m.  Gen: resting comfortably, no acute distress HEENT: no scleral icterus, pupils equal round and reactive, no palptable cervical adenopathy,  CV Resp: Clear to auscultation bilaterally GI: abdomen is soft, non-tender, non-distended, normal bowel sounds, no hepatosplenomegaly MSK: extremities are warm, no edema.  Skin: warm, no rash Neuro:  no focal deficits Psych: appropriate affect     Assessment and Plan  Aflutter - new diagnosis - already rate controlled on  toprol, no symptoms - CHADS2Vasc score is 5(HTN,Agex2, DM, gender), start eliquis 5mg  bid.  - obtain echo - if remains in rhythm at follow up and she is s/p her surgery would consider potential DCCV at that time.   2. Preoperative evaluation - able to tolerate over 4 METs without significant symptoms - f/u echo, if no significant findings would proceed with surgery. - would hold eliquis 48 hrs prior, resume day after procedure   05/27/21 Addendum Overall benign echo, recommend proceeding with surgery as planned   F/u 2 months     07/27/21, M.D.

## 2021-04-17 NOTE — Telephone Encounter (Signed)
Checking percert on the following patient for testing scheduled at [facility].     ECHO   05/08/2021 

## 2021-04-17 NOTE — Patient Instructions (Addendum)
Medication Instructions:  Begin Eliquis 5mg  twice a day. Continue all other medications.    Labwork: none  Testing/Procedures: Your physician has requested that you have an echocardiogram. Echocardiography is a painless test that uses sound waves to create images of your heart. It provides your doctor with information about the size and shape of your heart and how well your heart's chambers and valves are working. This procedure takes approximately one hour. There are no restrictions for this procedure. Office will contact with results via phone or letter.    Follow-Up: 2 months   Any Other Special Instructions Will Be Listed Below (If Applicable).  If you need a refill on your cardiac medications before your next appointment, please call your pharmacy.

## 2021-04-20 LAB — TYPE AND SCREEN
ABO/RH(D): O NEG
Antibody Screen: NEGATIVE
Unit division: 0
Unit division: 0

## 2021-04-20 LAB — BPAM RBC
Blood Product Expiration Date: 202208122359
Blood Product Expiration Date: 202208182359
Unit Type and Rh: 9500
Unit Type and Rh: 9500

## 2021-04-24 ENCOUNTER — Ambulatory Visit: Payer: Self-pay | Admitting: Nurse Practitioner

## 2021-05-01 ENCOUNTER — Encounter: Payer: Self-pay | Admitting: Nurse Practitioner

## 2021-05-01 ENCOUNTER — Ambulatory Visit (INDEPENDENT_AMBULATORY_CARE_PROVIDER_SITE_OTHER): Payer: Medicare HMO | Admitting: Nurse Practitioner

## 2021-05-01 ENCOUNTER — Other Ambulatory Visit: Payer: Self-pay

## 2021-05-01 VITALS — BP 116/69 | HR 88 | Temp 97.9°F | Resp 20 | Ht 62.0 in | Wt 160.0 lb

## 2021-05-01 DIAGNOSIS — Z6829 Body mass index (BMI) 29.0-29.9, adult: Secondary | ICD-10-CM

## 2021-05-01 DIAGNOSIS — R69 Illness, unspecified: Secondary | ICD-10-CM | POA: Diagnosis not present

## 2021-05-01 DIAGNOSIS — I4821 Permanent atrial fibrillation: Secondary | ICD-10-CM | POA: Diagnosis not present

## 2021-05-01 DIAGNOSIS — E119 Type 2 diabetes mellitus without complications: Secondary | ICD-10-CM | POA: Diagnosis not present

## 2021-05-01 DIAGNOSIS — I10 Essential (primary) hypertension: Secondary | ICD-10-CM

## 2021-05-01 DIAGNOSIS — F5101 Primary insomnia: Secondary | ICD-10-CM

## 2021-05-01 DIAGNOSIS — K219 Gastro-esophageal reflux disease without esophagitis: Secondary | ICD-10-CM

## 2021-05-01 DIAGNOSIS — E876 Hypokalemia: Secondary | ICD-10-CM

## 2021-05-01 LAB — BAYER DCA HB A1C WAIVED: HB A1C (BAYER DCA - WAIVED): 6 % (ref <7.0)

## 2021-05-01 MED ORDER — OMEPRAZOLE 40 MG PO CPDR: 40.0000 mg | DELAYED_RELEASE_CAPSULE | Freq: Every day | ORAL | 1 refills | Status: DC

## 2021-05-01 MED ORDER — POTASSIUM CHLORIDE CRYS ER 20 MEQ PO TBCR: 20.0000 meq | EXTENDED_RELEASE_TABLET | Freq: Every day | ORAL | 1 refills | Status: DC

## 2021-05-01 MED ORDER — METOPROLOL SUCCINATE ER 25 MG PO TB24: 25.0000 mg | ORAL_TABLET | Freq: Every day | ORAL | 1 refills | Status: DC

## 2021-05-01 NOTE — Progress Notes (Signed)
Subjective:    Patient ID: Norma Barton, female    DOB: [birth year], 86 y.o.   MRN: [id number]   Chief Complaint: medical management of chronic issues     HPI:  1. Primary hypertension No c/o chest pain, sob or headache. Does not check at home. BP Readings from Last 3 Encounters:  04/17/21 104/68  04/13/21 113/71  03/01/21 132/63     2. Diabetes mellitus without complication ([place]) Does not check her blood sugars at home. Lab Results  Component Value Date   HGBA1C 5.6 10/20/2020    3. Gastroesophageal reflux disease without esophagitis Is on omeprazole daily and works well to keep symptoms under control.  4. Hypokalemia Is on potassium supplement Lab Results  Component Value Date   K 3.7 04/13/2021     5. Primary insomnia Sleeps 5-6 hours. Stays up late and then sleeps late  6. Atrial fib Patient went in hospital to have knee replacement and was found to be in afib and surgery was cancelled. She was started on eliquis.   7. BMI 30.0-30.9,adult No recent weight changes Wt Readings from Last 3 Encounters:  05/01/21 160 lb (72.6 kg)  04/17/21 163 lb 3.2 oz (74 kg)  04/13/21 160 lb (72.6 kg)   BMI Readings from Last 3 Encounters:  05/01/21 29.26 kg/m  04/17/21 29.85 kg/m  04/13/21 29.26 kg/m       Outpatient Encounter Medications as of 05/01/2021  Medication Sig   apixaban (ELIQUIS) 5 MG TABS tablet Take 1 tablet (5 mg total) by mouth 2 (two) times daily.   cholecalciferol (VITAMIN D) 25 MCG (1000 UNIT) tablet Take 2,000 Units by mouth in the morning.   Cholecalciferol (VITAMIN D3) 125 MCG (5000 UT) CAPS Take by mouth.   furosemide (LASIX) 20 MG tablet Take 1 tablet (20 mg total) by mouth daily. (Patient taking differently: Take 20 mg by mouth in the morning.)   metoprolol succinate (TOPROL-XL) 25 MG 24 hr tablet Take 1 tablet (25 mg total) by mouth daily. (Patient taking differently: Take 25 mg by mouth in the morning.)   naproxen sodium (ALEVE)  220 MG tablet Take 220 mg by mouth daily as needed (knee pain.).   omeprazole (PRILOSEC) 40 MG capsule Take 1 capsule (40 mg total) by mouth daily. (Patient taking differently: Take 40 mg by mouth in the morning.)   potassium chloride SA (KLOR-CON) 20 MEQ tablet Take 1 tablet (20 mEq total) by mouth daily. (Patient taking differently: Take 20 mEq by mouth in the morning.)   No facility-administered encounter medications on file as of 05/01/2021.    Past Surgical History:  Procedure Laterality Date   ABDOMINAL HYSTERECTOMY     arthroscopy right knee     TOTAL KNEE ARTHROPLASTY Left 08/15/2016   Procedure: TOTAL KNEE ARTHROPLASTY;  Surgeon: Carole Civil, MD;  Location: [facility];  Service: Orthopedics;  Laterality: Left;    Family History  Problem Relation Age of Onset   Parkinson's disease Father    Breast cancer Sister     New complaints: None today  Social history: Lives by herself.  Controlled substance contract: n/a     Review of Systems  Constitutional:  Negative for diaphoresis.  Eyes:  Negative for pain.  Respiratory:  Negative for shortness of breath.   Cardiovascular:  Negative for chest pain, palpitations and leg swelling.  Gastrointestinal:  Negative for abdominal pain.  Endocrine: Negative for polydipsia.  Skin:  Negative for rash.  Neurological:  Negative for dizziness,  weakness and headaches.  Hematological:  Does not bruise/bleed easily.  All other systems reviewed and are negative.     Objective:   Physical Exam Vitals and nursing note reviewed.  Constitutional:      General: She is not in acute distress.    Appearance: Normal appearance. She is well-developed.  HENT:     Head: Normocephalic.     Right Ear: Tympanic membrane normal.     Left Ear: Tympanic membrane normal.     Nose: Nose normal.     Mouth/Throat:     Mouth: Mucous membranes are moist.  Eyes:     Pupils: Pupils are equal, round, and reactive to light.  Neck:     Vascular: No  carotid bruit or JVD.  Cardiovascular:     Rate and Rhythm: Normal rate and regular rhythm.     Heart sounds: Normal heart sounds.  Pulmonary:     Effort: Pulmonary effort is normal. No respiratory distress.     Breath sounds: Normal breath sounds. No wheezing or rales.  Chest:     Chest wall: No tenderness.  Abdominal:     General: Bowel sounds are normal. There is no distension or abdominal bruit.     Palpations: Abdomen is soft. There is no hepatomegaly, splenomegaly, mass or pulsatile mass.     Tenderness: There is no abdominal tenderness.  Musculoskeletal:        General: Normal range of motion.     Cervical back: Normal range of motion and neck supple.     Right lower leg: Edema (1+) present.     Left lower leg: Edema (2+) present.  Lymphadenopathy:     Cervical: No cervical adenopathy.  Skin:    General: Skin is warm and dry.  Neurological:     Mental Status: She is alert and oriented to person, place, and time.     Deep Tendon Reflexes: Reflexes are normal and symmetric.  Psychiatric:        Behavior: Behavior normal.        Thought Content: Thought content normal.        Judgment: Judgment normal.    BP 116/69   Pulse 88   Temp 97.9 F (36.6 C) (Temporal)   Resp 20   Ht _0  (1.575 m)   Wt 160 lb (72.6 kg)   SpO2 97%   BMI 29.26 kg/m        Assessment & Plan:  Norma Barton comes in today with chief complaint of Medical Management of Chronic Issues   Diagnosis and orders addressed:  1. Primary hypertension Low sodim diet - CBC with Differential/Platelet - CMP14+EGFR - metoprolol succinate (TOPROL-XL) 25 MG 24 hr tablet; Take 1 tablet (25 mg total) by mouth daily.  Dispense: 90 tablet; Refill: 1  2. Diabetes mellitus without complication ([place]) Watch carbs in diet - Bayer DCA Hb A1c Waived - Lipid panel  3. Gastroesophageal reflux disease without esophagitis Avoid spicy foods Do not eat 2 hours prior to bedtime - omeprazole (PRILOSEC) 40 MG  capsule; Take 1 capsule (40 mg total) by mouth daily.  Dispense: 90 capsule; Refill: 1  4. Hypokalemia Labs pending - potassium chloride SA (KLOR-CON) 20 MEQ tablet; Take 1 tablet (20 mEq total) by mouth daily.  Dispense: 90 tablet; Refill: 1  5. Primary insomnia Bedtime routine  6. BMI 29.0-29.9,adult Discussed diet and exercise for person with BMI >25 Will recheck weight in 3-6 months  7. Permanent atrial fibrillation ([place]) Keep  follow up with cardiology   Labs pending Health Maintenance reviewed Diet and exercise encouraged  Follow up plan: 6 months   [place], FNP

## 2021-05-02 LAB — CBC WITH DIFFERENTIAL/PLATELET
Basophils Absolute: 0.1 10*3/uL (ref 0.0–0.2)
Basos: 1 %
EOS (ABSOLUTE): 0.2 10*3/uL (ref 0.0–0.4)
Eos: 3 %
Hematocrit: 42.3 % (ref 34.0–46.6)
Hemoglobin: 14.6 g/dL (ref 11.1–15.9)
Immature Grans (Abs): 0 10*3/uL (ref 0.0–0.1)
Immature Granulocytes: 0 %
Lymphocytes Absolute: 3.2 10*3/uL — ABNORMAL HIGH (ref 0.7–3.1)
Lymphs: 44 %
MCH: 33.1 pg — ABNORMAL HIGH (ref 26.6–33.0)
MCHC: 34.5 g/dL (ref 31.5–35.7)
MCV: 96 fL (ref 79–97)
Monocytes Absolute: 0.5 10*3/uL (ref 0.1–0.9)
Monocytes: 7 %
Neutrophils Absolute: 3.2 10*3/uL (ref 1.4–7.0)
Neutrophils: 45 %
Platelets: 157 10*3/uL (ref 150–450)
RBC: 4.41 x10E6/uL (ref 3.77–5.28)
RDW: 12.1 % (ref 11.7–15.4)
WBC: 7.2 10*3/uL (ref 3.4–10.8)

## 2021-05-02 LAB — CMP14+EGFR
ALT: 18 IU/L (ref 0–32)
AST: 32 IU/L (ref 0–40)
Albumin/Globulin Ratio: 1.6 (ref 1.2–2.2)
Albumin: 3.9 g/dL (ref 3.6–4.6)
Alkaline Phosphatase: 98 IU/L (ref 44–121)
BUN/Creatinine Ratio: 20 (ref 12–28)
BUN: 20 mg/dL (ref 8–27)
Bilirubin Total: 0.5 mg/dL (ref 0.0–1.2)
CO2: 26 mmol/L (ref 20–29)
Calcium: 9.3 mg/dL (ref 8.7–10.3)
Chloride: 101 mmol/L (ref 96–106)
Creatinine, Ser: 0.99 mg/dL (ref 0.57–1.00)
Globulin, Total: 2.5 g/dL (ref 1.5–4.5)
Glucose: 137 mg/dL — ABNORMAL HIGH (ref 65–99)
Potassium: 3.7 mmol/L (ref 3.5–5.2)
Sodium: 142 mmol/L (ref 134–144)
Total Protein: 6.4 g/dL (ref 6.0–8.5)
eGFR: 56 mL/min/{1.73_m2} — ABNORMAL LOW (ref >59)

## 2021-05-02 LAB — LIPID PANEL
Chol/HDL Ratio: 2.3 ratio (ref 0.0–4.4)
Cholesterol, Total: 135 mg/dL (ref 100–199)
HDL: 58 mg/dL (ref >39)
LDL Chol Calc (NIH): 52 mg/dL (ref 0–99)
Triglycerides: 151 mg/dL — ABNORMAL HIGH (ref 0–149)
VLDL Cholesterol Cal: 25 mg/dL (ref 5–40)

## 2021-05-08 ENCOUNTER — Ambulatory Visit (INDEPENDENT_AMBULATORY_CARE_PROVIDER_SITE_OTHER): Payer: Medicare HMO | Admitting: Licensed Clinical Social Worker

## 2021-05-08 ENCOUNTER — Ambulatory Visit (HOSPITAL_COMMUNITY)
Admission: RE | Admit: 2021-05-08 | Discharge: 2021-05-08 | Disposition: A | Discharge status: Home / Self Care | Payer: Medicare HMO | Source: Ambulatory Visit | Attending: Cardiology | Admitting: Cardiology

## 2021-05-08 ENCOUNTER — Other Ambulatory Visit: Payer: Self-pay

## 2021-05-08 DIAGNOSIS — E876 Hypokalemia: Secondary | ICD-10-CM

## 2021-05-08 DIAGNOSIS — E119 Type 2 diabetes mellitus without complications: Secondary | ICD-10-CM

## 2021-05-08 DIAGNOSIS — F5101 Primary insomnia: Secondary | ICD-10-CM

## 2021-05-08 DIAGNOSIS — I10 Essential (primary) hypertension: Secondary | ICD-10-CM

## 2021-05-08 DIAGNOSIS — M170 Bilateral primary osteoarthritis of knee: Secondary | ICD-10-CM

## 2021-05-08 DIAGNOSIS — I4892 Unspecified atrial flutter: Secondary | ICD-10-CM | POA: Diagnosis not present

## 2021-05-08 DIAGNOSIS — K219 Gastro-esophageal reflux disease without esophagitis: Secondary | ICD-10-CM

## 2021-05-08 LAB — ECHOCARDIOGRAM COMPLETE
AR max vel: 1.76 cm2
AV Area VTI: 1.87 cm2
AV Area mean vel: 1.87 cm2
AV Mean grad: 3.4 mmHg
AV Peak grad: 6.9 mmHg
Ao pk vel: 1.31 m/s
Area-P 1/2: 4.49 cm2
S' Lateral: 2.3 cm

## 2021-05-08 NOTE — Chronic Care Management (AMB) (Signed)
Chronic Care Management    Clinical Social Work Note  05/08/2021 Name: Norma Barton MRN: [id number] DOB: Sep 05, 1935  Norma Barton is a 86 y.o. year old female who is a primary care patient of Norma Pierini, FNP. The CCM team was consulted to assist the patient with chronic disease management and/or care coordination needs related to: [name] .   Engaged with patient Norma Barton of client, Norma Barton, by telephone for follow up visit in response to provider referral for social work chronic care management and care coordination services.   Consent to Services:  The patient was given information about Chronic Care Management services, agreed to services, and gave verbal consent prior to initiation of services.  Please see initial visit note for detailed documentation.   Patient agreed to services and consent obtained.   Assessment: Review of patient past medical history, allergies, medications, and health status, including review of relevant consultants reports was performed today as part of a comprehensive evaluation and provision of chronic care management and care coordination services.     SDOH (Social Determinants of Health) assessments and interventions performed:  SDOH Interventions    Flowsheet Row Most Recent Value  SDOH Interventions   Physical Activity Interventions Other (Comments)  [knee pain issues, uses a walker to help her ambulate]  Depression Interventions/Treatment  --  [informed son, Norma Barton, of LCSW support and of RNCM support]        Advanced Directives Status: See Vynca application for related entries.  CCM Care Plan  No Known Allergies  Outpatient Encounter Medications as of 05/08/2021  Medication Sig   apixaban (ELIQUIS) 5 MG TABS tablet Take 1 tablet (5 mg total) by mouth 2 (two) times daily.   cholecalciferol (VITAMIN D) 25 MCG (1000 UNIT) tablet Take 2,000 Units by mouth in the morning.   Cholecalciferol (VITAMIN D3) 125 MCG  (5000 UT) CAPS Take by mouth.   furosemide (LASIX) 20 MG tablet Take 1 tablet (20 mg total) by mouth daily. (Patient taking differently: Take 20 mg by mouth in the morning.)   metoprolol succinate (TOPROL-XL) 25 MG 24 hr tablet Take 1 tablet (25 mg total) by mouth daily.   naproxen sodium (ALEVE) 220 MG tablet Take 220 mg by mouth daily as needed (knee pain.).   omeprazole (PRILOSEC) 40 MG capsule Take 1 capsule (40 mg total) by mouth daily.   potassium chloride SA (KLOR-CON) 20 MEQ tablet Take 1 tablet (20 mEq total) by mouth daily.   No facility-administered encounter medications on file as of 05/08/2021.    Patient Active Problem List   Diagnosis Date Noted   Diabetes mellitus without complication (HCC) 10/19/2019   BMI 30.0-30.9,adult 01/12/2018   Gastroesophageal reflux disease without esophagitis 01/12/2018   Hypokalemia 01/12/2018   Hypertension 04/12/2013   Osteoarthritis of both knees 04/12/2013   Insomnia 11/22/2012   Cataract 08/04/2012    Conditions to be addressed/monitored: monitor client management of medical needs; monitor client completion of ADLs as she is able  Care Plan : LCSW Care plan  Updates made by Norma Blakes, LCSW since 05/08/2021 12:00 AM     Problem: Coping Skills (General Plan of Care)      Goal: Manage medical needs of client and manage daily activities of client   Start Date: 05/08/2021  Expected End Date: 08/08/2021  This Visit's Progress: On track  Recent Progress: On track  Priority: Medium  Note:   Current barriers:   Patient in need of assistance with connecting  to community resources for assistance in managing ongoing client medical conditions Patient is unable to independently navigate community resource options without care coordination support Mobility issues Pain issues  Clinical Goals:  patient will work with SW in next 30 days  to address concerns related to client management of health needs faced and related to client  completion of daily ADLs Client to call RNCM or LCSW  as needed for support in next 30 days Client to talk with LCSW in next 30 days related to mobility issues of client and related to pain issues of client   Clinical Interventions:  Collaboration with Norma Pierini, FNP regarding development and update of comprehensive plan of care as evidenced by provider attestation and co-signature Talked with Norma Barton, son of client, today about client needs Norma Barton informed LCSW that client's knee surgery was postponed due to a heart issue of client Norma Barton and LCSW talked of client appointment today for echocardiogram at [facility] in [place], [place] and LCSW spoke of client appointment in two weeks with cardiologist Spoke with Norma Barton about client difficulty in sleeping.  He said she is taking more naps during the day Spoke with Norma Barton about transport needs of client. He said family members transport client to and from her scheduled medical appointments  Encouraged client or Norma Barton to call [facility] as needed for CCM nursing support for client Talked with Norma Barton about pain issues of client's right knee Talked with Norma Barton about medication procurement for client Talked with Norma Barton about upcoming client medical appointments Talked with Norma Barton about appetite of client Talked with Norma Barton  about client's previous knee surgery on her left knee  Patient Strengths:  Takes medications as prescribed Attends scheduled medical appointments Has support from her two sons Communicates well with others  Patient Deficits:  Challenges in managing medical needs Pain issues Mobility issues  Patient Goals:   Attend scheduled medical appointments Take medications as prescribed Call RNCM or LCSW as needed for support Call medical provider as needed for information about managing client medical issues faced -  Follow Up Plan: LCSW to call client  or son of client on 06/18/21 to assess client needs       Norma Barton MSW, LCSW Licensed Clinical Social Worker [facility] Care Management [phone]

## 2021-05-08 NOTE — Patient Instructions (Signed)
Visit Information  PATIENT GOALS:  Goals Addressed               This Visit's Progress     Manage medical needs faced and complete ADLs daily (pt-stated)        Timeframe:  Short-Term Goal Priority:  Medium Progress: On Track Start Date:        05/08/21                     Expected End Date:       08/08/21                Follow Up Date 06/18/21   Protect My Health (Patient) Manage health needs of client and manage daily activities as needed    Why is this important?   Screening tests can find diseases early when they are easier to treat.  Your doctor or nurse will talk with you about which tests are important for you.  Getting shots for common diseases like the flu and shingles will help prevent them.      Patient Strengths:  Takes medications as prescribed Attends scheduled medical appointments Has support from her two sons Communicates well with others  Patient Deficits:  Challenges in managing medical needs Pain issues Mobility issues  Patient Goals:   Attend scheduled medical appointments Take medications as prescribed Call RNCM or LCSW as needed for support Call medical provider as needed for information about managing client medical issues faced -  Follow Up Plan: LCSW to call client or son of client on  06/18/21 to assess client needs             Kelton Pillar.Cassaundra Rasch MSW, LCSW Licensed Clinical Social Worker [facility] Care Management [phone]

## 2021-05-08 NOTE — Progress Notes (Signed)
*  PRELIMINARY RESULTS* Echocardiogram 2D Echocardiogram has been performed.  Stacey Drain 05/08/2021, 4:01 PM

## 2021-05-15 ENCOUNTER — Ambulatory Visit (INDEPENDENT_AMBULATORY_CARE_PROVIDER_SITE_OTHER): Payer: Medicare HMO | Admitting: Nurse Practitioner

## 2021-05-15 ENCOUNTER — Encounter: Payer: Self-pay | Admitting: Nurse Practitioner

## 2021-05-15 DIAGNOSIS — R059 Cough, unspecified: Secondary | ICD-10-CM | POA: Insufficient documentation

## 2021-05-15 MED ORDER — BENZONATATE 100 MG PO CAPS: 100.0000 mg | ORAL_CAPSULE | Freq: Two times a day (BID) | ORAL | 0 refills | Status: DC

## 2021-05-15 NOTE — Assessment & Plan Note (Signed)
COVID-19 completed results pending. Benzonate for cough, Rx sent to pharmacy,

## 2021-05-15 NOTE — Progress Notes (Signed)
   Virtual Visit  Note Due to COVID-19 pandemic this visit was conducted virtually. This visit type was conducted due to national recommendations for restrictions regarding the COVID-19 Pandemic (e.g. social distancing, sheltering in place) in an effort to limit this patient's exposure and mitigate transmission in our community. All issues noted in this document were discussed and addressed.  A physical exam was not performed with this format.  I connected with Norma Barton on 05/15/21 at 1:24 by telephone and verified that I am speaking with the correct person using two identifiers. Norma Barton is currently located at home during visit. The provider, Daryll Drown, NP is located in their office at time of visit.  I discussed the limitations, risks, security and privacy concerns of performing an evaluation and management service by telephone and the availability of in person appointments. I also discussed with the patient that there may be a patient responsible charge related to this service. The patient expressed understanding and agreed to proceed.   History and Present Illness:  Cough This is a new problem. Episode onset: in the past 2-3 days. The problem has been unchanged. The problem occurs constantly. The cough is Non-productive. Associated symptoms include chills and nasal congestion. Pertinent negatives include no chest pain, ear congestion, sore throat or shortness of breath. Nothing aggravates the symptoms. She has tried nothing for the symptoms.     Review of Systems  Constitutional:  Positive for chills.  HENT:  Negative for sore throat.   Respiratory:  Positive for cough. Negative for shortness of breath.   Cardiovascular:  Negative for chest pain.    Observations/Objective: Televisit patient was in distress.  Assessment and Plan: COVID-19 completed results pending. Benzonate for cough, Rx sent to pharmacy,   Follow Up Instructions: Follow-up for worsening  unresolved symptoms.    I discussed the assessment and treatment plan with the patient. The patient was provided an opportunity to ask questions and all were answered. The patient agreed with the plan and demonstrated an understanding of the instructions.   The patient was advised to call back or seek an in-person evaluation if the symptoms worsen or if the condition fails to improve as anticipated.  The above assessment and management plan was discussed with the patient. The patient verbalized understanding of and has agreed to the management plan. Patient is aware to call the clinic if symptoms persist or worsen. Patient is aware when to return to the clinic for a follow-up visit. Patient educated on when it is appropriate to go to the emergency department.   Time call ended: 1:32 PM  I provided 9 minutes of  non face-to-face time during this encounter.    Daryll Drown, NP

## 2021-05-16 LAB — NOVEL CORONAVIRUS, NAA: SARS-CoV-2, NAA: NOT DETECTED

## 2021-05-16 LAB — SARS-COV-2, NAA 2 DAY TAT

## 2021-05-17 ENCOUNTER — Telehealth: Payer: Self-pay | Admitting: Nurse Practitioner

## 2021-05-17 MED ORDER — AMOXICILLIN 500 MG PO CAPS: 500.0000 mg | ORAL_CAPSULE | Freq: Two times a day (BID) | ORAL | 0 refills | Status: DC

## 2021-05-17 NOTE — Telephone Encounter (Signed)
Patient aware and verbalized understanding. °

## 2021-05-17 NOTE — Telephone Encounter (Signed)
I sent amoxicillin for the patient 

## 2021-05-31 ENCOUNTER — Encounter: Payer: Self-pay | Admitting: Nurse Practitioner

## 2021-05-31 ENCOUNTER — Ambulatory Visit (INDEPENDENT_AMBULATORY_CARE_PROVIDER_SITE_OTHER): Payer: Medicare HMO | Admitting: Nurse Practitioner

## 2021-05-31 ENCOUNTER — Other Ambulatory Visit: Payer: Self-pay

## 2021-05-31 VITALS — BP 112/73 | HR 71 | Temp 97.4°F | Resp 20 | Ht 62.0 in | Wt 159.0 lb

## 2021-05-31 DIAGNOSIS — Z8739 Personal history of other diseases of the musculoskeletal system and connective tissue: Secondary | ICD-10-CM

## 2021-05-31 DIAGNOSIS — Z23 Encounter for immunization: Secondary | ICD-10-CM | POA: Diagnosis not present

## 2021-05-31 DIAGNOSIS — M25572 Pain in left ankle and joints of left foot: Secondary | ICD-10-CM | POA: Diagnosis not present

## 2021-05-31 MED ORDER — PREDNISONE 20 MG PO TABS: 40.0000 mg | ORAL_TABLET | Freq: Every day | ORAL | 0 refills | Status: AC

## 2021-05-31 NOTE — Progress Notes (Signed)
   Subjective:    Patient ID: Norma Barton, female    DOB: 03/16/1935, 86 y.o.   MRN: [id number]  Chief Complaint: Foot Swelling and Knee Pain   HPI Patient comes in c/o left medial ankle pain and toe pain. Has swollen some. She has had gout in the past. Rates pain 9/10. She says toe pain has gotten some better, but ankle pain is no better. Denies any injury, says pain started all the sudden    Review of Systems  Constitutional:  Negative for diaphoresis.  Eyes:  Negative for pain.  Respiratory:  Negative for shortness of breath.   Cardiovascular:  Negative for chest pain, palpitations and leg swelling.  Gastrointestinal:  Negative for abdominal pain.  Endocrine: Negative for polydipsia.  Musculoskeletal:  Positive for arthralgias (left ankle).  Skin:  Negative for rash.  Neurological:  Negative for dizziness, weakness and headaches.  Hematological:  Does not bruise/bleed easily.  All other systems reviewed and are negative.     Objective:   Physical Exam Vitals and nursing note reviewed.  Constitutional:      Appearance: Normal appearance.  Cardiovascular:     Rate and Rhythm: Normal rate and regular rhythm.     Heart sounds: Normal heart sounds.  Pulmonary:     Effort: Pulmonary effort is normal.     Breath sounds: Normal breath sounds.  Musculoskeletal:     Comments: Left ankle edema with tenderness medially   Skin:    General: Skin is warm.  Neurological:     General: No focal deficit present.     Mental Status: She is alert and oriented to person, place, and time.  Psychiatric:        Mood and Affect: Mood normal.        Behavior: Behavior normal.   BP 112/73   Pulse 71   Temp (!) 97.4 F (36.3 C) (Temporal)   Resp 20   Ht 5\' 2"  (1.575 m)   Wt 159 lb (72.1 kg)   SpO2 92%   BMI 29.08 kg/m         Assessment & Plan:  CHAKIRA JACHIM in today with chief complaint of Foot Swelling and Knee Pain   1. Acute left ankle pain Wrap Elevate  Ice  bid rest - predniSONE (DELTASONE) 20 MG tablet; Take 2 tablets (40 mg total) by mouth daily with breakfast for 5 days. 2 po daily for 5 days  Dispense: 10 tablet; Refill: 0  2. Hx of gout Labs pending - Arthritis Panel    The above assessment and management plan was discussed with the patient. The patient verbalized understanding of and has agreed to the management plan. Patient is aware to call the clinic if symptoms persist or worsen. Patient is aware when to return to the clinic for a follow-up visit. Patient educated on when it is appropriate to go to the emergency department.   Norma Wonda Horner, FNP

## 2021-05-31 NOTE — Patient Instructions (Signed)
Gout Gout is painful swelling of your joints. Gout is a type of arthritis. It is caused by having too much uric acid in your body. Uric acid is a chemical that is made when your body breaks down substances called purines. If your body has too much uric acid, sharp crystals can form and build up in your joints. This causes pain and swelling. Gout attacks can happen quickly and be very painful (acute gout). Over time, the attacks can affect more joints and happen more often (chronic gout). What are the causes? Too much uric acid in your blood. This can happen because: Your kidneys do not remove enough uric acid from your blood. Your body makes too much uric acid. You eat too many foods that are high in purines. These foods include organ meats, some seafood, and beer. Trauma or stress. What increases the risk? Having a family history of gout. Being female and middle-aged. Being female and having gone through menopause. Being very overweight (obese). Drinking alcohol, especially beer. Not having enough water in the body (being dehydrated). Losing weight too quickly. Having an organ transplant. Having lead poisoning. Taking certain medicines. Having kidney disease. Having a skin condition called psoriasis. What are the signs or symptoms? An attack of acute gout usually happens in just one joint. The most common place is the big toe. Attacks often start at night. Other joints that may be affected include joints of the feet, ankle, knee, fingers, wrist, or elbow. Symptoms of an attack may include: Very bad pain. Warmth. Swelling. Stiffness. Shiny, red, or purple skin. Tenderness. The affected joint may be very painful to touch. Chills and fever. Chronic gout may cause symptoms more often. More joints may be involved. You may also have white or yellow lumps (tophi) on your hands or feet or in other areas near your joints. How is this treated? Treatment for this condition has two phases:  treating an acute attack and preventing future attacks. Acute gout treatment may include: NSAIDs. Steroids. These are taken by mouth or injected into a joint. Colchicine. This medicine relieves pain and swelling. It can be given by mouth or through an IV tube. Preventive treatment may include: Taking small doses of NSAIDs or colchicine daily. Using a medicine that reduces uric acid levels in your blood. Making changes to your diet. You may need to see a food expert (dietitian) about what to eat and drink to prevent gout. Follow these instructions at home: During a gout attack  If told, put ice on the painful area: Put ice in a plastic bag. Place a towel between your skin and the bag. Leave the ice on for 20 minutes, 2-3 times a day. Raise (elevate) the painful joint above the level of your heart as often as you can. Rest the joint as much as possible. If the joint is in your leg, you may be given crutches. Follow instructions from your doctor about what you cannot eat or drink. Avoiding future gout attacks Eat a low-purine diet. Avoid foods and drinks such as: Liver. Kidney. Anchovies. Asparagus. Herring. Mushrooms. Mussels. Beer. Stay at a healthy weight. If you want to lose weight, talk with your doctor. Do not lose weight too fast. Start or continue an exercise plan as told by your doctor. Eating and drinking Drink enough fluids to keep your pee (urine) pale yellow. If you drink alcohol: Limit how much you use to: 0-1 drink a day for women. 0-2 drinks a day for men. Be aware of   how much alcohol is in your drink. In the [place] one drink equals one 12 oz bottle of beer (355 mL), one 5 oz glass of wine (148 mL), or one 1 oz glass of hard liquor (44 mL). General instructions Take over-the-counter and prescription medicines only as told by your doctor. Do not drive or use heavy machinery while taking prescription pain medicine. Return to your normal activities as told by your  doctor. Ask your doctor what activities are safe for you. Keep all follow-up visits as told by your doctor. This is important. Contact a doctor if: You have another gout attack. You still have symptoms of a gout attack after 10 days of treatment. You have problems (side effects) because of your medicines. You have chills or a fever. You have burning pain when you pee (urinate). You have pain in your lower back or belly. Get help right away if: You have very bad pain. Your pain cannot be controlled. You cannot pee. Summary Gout is painful swelling of the joints. The most common site of pain is the big toe, but it can affect other joints. Medicines and avoiding some foods can help to prevent and treat gout attacks. This information is not intended to replace advice given to you by your health care provider. Make sure you discuss any questions you have with your health care provider. Document Revised: 03/25/2018 Document Reviewed: 03/25/2018 Elsevier Patient Education  2022 Elsevier Inc.  

## 2021-06-01 LAB — ARTHRITIS PANEL
Basophils Absolute: 0.1 10*3/uL (ref 0.0–0.2)
Basos: 1 %
EOS (ABSOLUTE): 0.2 10*3/uL (ref 0.0–0.4)
Eos: 3 %
Hematocrit: 44.3 % (ref 34.0–46.6)
Hemoglobin: 14.8 g/dL (ref 11.1–15.9)
Immature Grans (Abs): 0 10*3/uL (ref 0.0–0.1)
Immature Granulocytes: 0 %
Lymphocytes Absolute: 2.3 10*3/uL (ref 0.7–3.1)
Lymphs: 33 %
MCH: 32 pg (ref 26.6–33.0)
MCHC: 33.4 g/dL (ref 31.5–35.7)
MCV: 96 fL (ref 79–97)
Monocytes Absolute: 0.4 10*3/uL (ref 0.1–0.9)
Monocytes: 6 %
Neutrophils Absolute: 4 10*3/uL (ref 1.4–7.0)
Neutrophils: 57 %
Platelets: 202 10*3/uL (ref 150–450)
RBC: 4.62 x10E6/uL (ref 3.77–5.28)
RDW: 11.8 % (ref 11.7–15.4)
Rheumatoid fact SerPl-aCnc: <10 IU/mL (ref <14.0)
Sed Rate: 15 mm/hr (ref 0–40)
Uric Acid: 8.7 mg/dL — ABNORMAL HIGH (ref 3.1–7.9)
WBC: 6.9 10*3/uL (ref 3.4–10.8)

## 2021-06-18 ENCOUNTER — Telehealth: Payer: Medicare HMO

## 2021-06-20 ENCOUNTER — Ambulatory Visit: Payer: Medicare HMO | Admitting: Cardiology

## 2021-06-20 ENCOUNTER — Encounter: Payer: Self-pay | Admitting: Cardiology

## 2021-06-20 VITALS — BP 110/70 | HR 76 | Ht 62.0 in | Wt 161.0 lb

## 2021-06-20 DIAGNOSIS — I4892 Unspecified atrial flutter: Secondary | ICD-10-CM | POA: Diagnosis not present

## 2021-06-20 DIAGNOSIS — Z01810 Encounter for preprocedural cardiovascular examination: Secondary | ICD-10-CM | POA: Diagnosis not present

## 2021-06-20 NOTE — Patient Instructions (Addendum)

## 2021-06-20 NOTE — Progress Notes (Signed)
Clinical Summary Ms. Shontz is a 86 y.o.female  1.Aflutter - new diagnosis, noted during 03/2021 preop evaluation for knee replacement     - occasional palpitations at times though infrequent  - compliant with toprol, she is on eliquls   2. Preop evaluation - plans for knee replacement - had been postponed after being found in aflutter at preop Past Medical History:  Diagnosis Date   Diabetes mellitus without complication (HCC)    GERD (gastroesophageal reflux disease)    Hypertension      No Known Allergies   Current Outpatient Medications  Medication Sig Dispense Refill   apixaban (ELIQUIS) 5 MG TABS tablet Take 1 tablet (5 mg total) by mouth 2 (two) times daily. 60 tablet 6   cholecalciferol (VITAMIN D) 25 MCG (1000 UNIT) tablet Take 2,000 Units by mouth in the morning.     Cholecalciferol (VITAMIN D3) 125 MCG (5000 UT) CAPS Take by mouth.     furosemide (LASIX) 20 MG tablet Take 1 tablet (20 mg total) by mouth daily. (Patient taking differently: Take 20 mg by mouth in the morning.) 90 tablet 1   metoprolol succinate (TOPROL-XL) 25 MG 24 hr tablet Take 1 tablet (25 mg total) by mouth daily. 90 tablet 1   naproxen sodium (ALEVE) 220 MG tablet Take 220 mg by mouth daily as needed (knee pain.).     omeprazole (PRILOSEC) 40 MG capsule Take 1 capsule (40 mg total) by mouth daily. 90 capsule 1   potassium chloride SA (KLOR-CON) 20 MEQ tablet Take 1 tablet (20 mEq total) by mouth daily. 90 tablet 1   No current facility-administered medications for this visit.     Past Surgical History:  Procedure Laterality Date   ABDOMINAL HYSTERECTOMY     arthroscopy right knee     TOTAL KNEE ARTHROPLASTY Left 08/15/2016   Procedure: TOTAL KNEE ARTHROPLASTY;  Surgeon: Vickki Hearing, MD;  Location: [facility];  Service: Orthopedics;  Laterality: Left;     No Known Allergies    Family History  Problem Relation Age of Onset   Parkinson's disease Father    Breast cancer  Sister      Social History Ms. Opiela reports that she has never smoked. She has never used smokeless tobacco. Ms. Sisley reports no history of alcohol use.   Review of Systems CONSTITUTIONAL: No weight loss, fever, chills, weakness or fatigue.  HEENT: Eyes: No visual loss, blurred vision, double vision or yellow sclerae.No hearing loss, sneezing, congestion, runny nose or sore throat.  SKIN: No rash or itching.  CARDIOVASCULAR: per hpi RESPIRATORY: No shortness of breath, cough or sputum.  GASTROINTESTINAL: No anorexia, nausea, vomiting or diarrhea. No abdominal pain or blood.  GENITOURINARY: No burning on urination, no polyuria NEUROLOGICAL: No headache, dizziness, syncope, paralysis, ataxia, numbness or tingling in the extremities. No change in bowel or bladder control.  MUSCULOSKELETAL: No muscle, back pain, joint pain or stiffness.  LYMPHATICS: No enlarged nodes. No history of splenectomy.  PSYCHIATRIC: No history of depression or anxiety.  ENDOCRINOLOGIC: No reports of sweating, cold or heat intolerance. No polyuria or polydipsia.  Marland Kitchen   Physical Examination Today's Vitals   06/20/21 1404  BP: 110/70  Pulse: 76  SpO2: 98%  Weight: 161 lb (73 kg)  Height: 5\' 2"  (1.575 m)   Body mass index is 29.45 kg/m.  Gen: resting comfortably, no acute distress HEENT: no scleral icterus, pupils equal round and reactive, no palptable cervical adenopathy,  CV: RRR, no m/r/  gno jvd Resp: Clear to auscultation bilaterally GI: abdomen is soft, non-tender, non-distended, normal bowel sounds, no hepatosplenomegaly MSK: extremities are warm, no edema.  Skin: warm, no rash Neuro:  no focal deficits Psych: appropriate affect   Diagnostic Studies  04/2021 echo IMPRESSIONS     1. Left ventricular ejection fraction, by estimation, is 60 to 65%. The  left ventricle has normal function. The left ventricle has no regional  wall motion abnormalities. There is mild left ventricular  hypertrophy of  the basal-septal segment. Left  ventricular diastolic parameters are indeterminate.   2. Right ventricular systolic function is normal. The right ventricular  size is normal. There is normal pulmonary artery systolic pressure.   3. Left atrial size was moderately dilated.   4. The mitral valve is normal in structure. No evidence of mitral valve  regurgitation. No evidence of mitral stenosis.   5. The aortic valve was not well visualized. Aortic valve regurgitation  is not visualized. No aortic stenosis is present.   6. The inferior vena cava is normal in size with greater than 50%  respiratory variability, suggesting right atrial pressure of 3 mmHg.    Assessment and Plan   Aflutter - EKG shows she is back in SR today, she is paroxysmal - doing well on toprol, eliquis. We will continue   2. Preoperative evaluation - able to tolerate over 4 METs without significant symptoms -her arrhythmia is controlled - recommend proceeding with knee replacement from cardiac standpoint. Hold eliquis 2 days prior, resume day after.     Antoine Poche, M.D.

## 2021-06-26 ENCOUNTER — Encounter: Payer: Self-pay | Admitting: Family Medicine

## 2021-06-26 ENCOUNTER — Ambulatory Visit (INDEPENDENT_AMBULATORY_CARE_PROVIDER_SITE_OTHER): Payer: Medicare HMO | Admitting: Family Medicine

## 2021-06-26 DIAGNOSIS — M109 Gout, unspecified: Secondary | ICD-10-CM | POA: Diagnosis not present

## 2021-06-26 MED ORDER — PREDNISONE 20 MG PO TABS: ORAL_TABLET | ORAL | 0 refills | Status: DC

## 2021-06-26 NOTE — Progress Notes (Signed)
Virtual Visit via telephone Note  I connected with Norma Barton on 06/26/21 at 1250 by telephone and verified that I am speaking with the correct person using two identifiers. Norma Barton is currently located at home and patient are currently with her during visit. The provider, Elige Radon Shevon Sian, MD is located in their office at time of visit.  Call ended at 1257  I discussed the limitations, risks, security and privacy concerns of performing an evaluation and management service by telephone and the availability of in person appointments. I also discussed with the patient that there may be a patient responsible charge related to this service. The patient expressed understanding and agreed to proceed.   History and Present Illness: Patient is calling in for gout in right foot this time, she was treated a few weeks ago in left foot.  She denies fevers or chills.  She has pain in great toe on right foot this time.  She has this in the past before. She is having trouble walking with the pain.  This started 2 days ago.  She can't take certain medicines with anticoagulants.   1. Acute gout of right foot, unspecified cause     Outpatient Encounter Medications as of 06/26/2021  Medication Sig   predniSONE (DELTASONE) 20 MG tablet 2 po at same time daily for 5 days   apixaban (ELIQUIS) 5 MG TABS tablet Take 1 tablet (5 mg total) by mouth 2 (two) times daily.   cholecalciferol (VITAMIN D) 25 MCG (1000 UNIT) tablet Take 2,000 Units by mouth in the morning.   furosemide (LASIX) 20 MG tablet Take 1 tablet (20 mg total) by mouth daily. (Patient taking differently: Take 20 mg by mouth in the morning.)   metoprolol succinate (TOPROL-XL) 25 MG 24 hr tablet Take 1 tablet (25 mg total) by mouth daily.   naproxen sodium (ALEVE) 220 MG tablet Take 220 mg by mouth daily as needed (knee pain.).   omeprazole (PRILOSEC) 40 MG capsule Take 1 capsule (40 mg total) by mouth daily.   potassium chloride SA  (KLOR-CON) 20 MEQ tablet Take 1 tablet (20 mEq total) by mouth daily.   No facility-administered encounter medications on file as of 06/26/2021.    Review of Systems  Constitutional:  Negative for chills and fever.  Eyes:  Negative for visual disturbance.  Respiratory:  Negative for chest tightness and shortness of breath.   Cardiovascular:  Negative for chest pain and leg swelling.  Musculoskeletal:  Positive for arthralgias, gait problem and joint swelling. Negative for back pain and myalgias.  Skin:  Negative for rash.  Neurological:  Negative for light-headedness and headaches.  Psychiatric/Behavioral:  Negative for agitation and behavioral problems.   All other systems reviewed and are negative.  Observations/Objective: Patient sounds comfortable and in no acute distress  Assessment and Plan: Problem List Items Addressed This Visit   None Visit Diagnoses     Acute gout of right foot, unspecified cause    -  Primary   Relevant Medications   predniSONE (DELTASONE) 20 MG tablet       Will treat with steroids because of anticoagulation, want to avoid NSAIDs. Follow up plan: Return if symptoms worsen or fail to improve.     I discussed the assessment and treatment plan with the patient. The patient was provided an opportunity to ask questions and all were answered. The patient agreed with the plan and demonstrated an understanding of the instructions.   The patient was  advised to call back or seek an in-person evaluation if the symptoms worsen or if the condition fails to improve as anticipated.  The above assessment and management plan was discussed with the patient. The patient verbalized understanding of and has agreed to the management plan. Patient is aware to call the clinic if symptoms persist or worsen. Patient is aware when to return to the clinic for a follow-up visit. Patient educated on when it is appropriate to go to the emergency department.    I provided 7  minutes of non-face-to-face time during this encounter.    Nils Pyle, MD

## 2021-07-05 ENCOUNTER — Telehealth: Payer: Self-pay | Admitting: *Deleted

## 2021-07-05 NOTE — Telephone Encounter (Signed)
Lesle Chris, LPN  86/76/7209  3:47 PM EDT Back to Top    Notified, copy to pcp & ortho.    Lesle Chris, LPN  4/70/9628  3:03 PM EDT     Left message to return call.   Antoine Poche, MD  05/27/2021  8:36 AM EDT     Normal echo, nothing to prevent her from having knee surgery     Dominga Ferry MD

## 2021-07-09 ENCOUNTER — Telehealth: Payer: Self-pay | Admitting: Nurse Practitioner

## 2021-07-09 DIAGNOSIS — R140 Abdominal distension (gaseous): Secondary | ICD-10-CM

## 2021-07-09 NOTE — Telephone Encounter (Signed)
REFERRAL REQUEST Telephone Note  Have you been seen at our office for this problem? Pt said she discussed with mmm (Advise that they may need an appointment with their PCP before a referral can be done)  Reason for Referral: bloating and stomach pain Referral discussed with patient: yes Best contact number of patient for referral team:  [phone]  Has patient been seen by a specialist for this issue before: no Patient provider preference for referral:  Patient location preference for referral:    Patient notified that referrals can take up to a week or longer to process. If they haven't heard anything within a week they should call back and speak with the referral department.

## 2021-07-09 NOTE — Telephone Encounter (Deleted)
REFERRAL REQUEST Telephone Note  Have you been seen at our office for this problem? Pt said she discussed with mmm (Advise that they may need an appointment with their PCP before a referral can be done)  Reason for Referral: bloating and stomach pain Referral discussed with patient:  Best contact number of patient for referral team:    Has patient been seen by a specialist for this issue before:  Patient provider preference for referral:  Patient location preference for referral:    Patient notified that referrals can take up to a week or longer to process. If they haven't heard anything within a week they should call back and speak with the referral department.

## 2021-08-07 ENCOUNTER — Ambulatory Visit (INDEPENDENT_AMBULATORY_CARE_PROVIDER_SITE_OTHER): Payer: Medicare HMO | Admitting: Licensed Clinical Social Worker

## 2021-08-07 ENCOUNTER — Telehealth: Payer: Self-pay | Admitting: Nurse Practitioner

## 2021-08-07 DIAGNOSIS — M170 Bilateral primary osteoarthritis of knee: Secondary | ICD-10-CM

## 2021-08-07 DIAGNOSIS — E876 Hypokalemia: Secondary | ICD-10-CM

## 2021-08-07 DIAGNOSIS — E119 Type 2 diabetes mellitus without complications: Secondary | ICD-10-CM

## 2021-08-07 DIAGNOSIS — F5101 Primary insomnia: Secondary | ICD-10-CM

## 2021-08-07 DIAGNOSIS — I10 Essential (primary) hypertension: Secondary | ICD-10-CM

## 2021-08-07 DIAGNOSIS — K219 Gastro-esophageal reflux disease without esophagitis: Secondary | ICD-10-CM

## 2021-08-07 NOTE — Chronic Care Management (AMB) (Signed)
Chronic Care Management    Clinical Social Work Note  08/07/2021 Name: Norma Barton MRN: [id number] DOB: 08/01/1935  Norma Barton is a 86 y.o. year old female who is a primary care patient of [name], FNP. The CCM team was consulted to assist the patient with chronic disease management and/or care coordination needs related to: [name] .   Engaged with patient / son of patient, Latiya Navia, by telephone for follow up visit in response to provider referral for social work chronic care management and care coordination services.   Consent to Services:  The patient was given information about Chronic Care Management services, agreed to services, and gave verbal consent prior to initiation of services.  Please see initial visit note for detailed documentation.   Patient agreed to services and consent obtained.   Assessment: Review of patient past medical history, allergies, medications, and health status, including review of relevant consultants reports was performed today as part of a comprehensive evaluation and provision of chronic care management and care coordination services.     SDOH (Social Determinants of Health) assessments and interventions performed:  SDOH Interventions    Flowsheet Row Most Recent Value  SDOH Interventions   Physical Activity Interventions Other (Comments)  [walking challenges. Has some gait problems. Pain in right knee]  Stress Interventions Provide Counseling  [client has stress over managing medical needs. She has sleeping challenges]  Depression Interventions/Treatment  --  [informed Rolan Bucco, son of client, of LCSW support for client and of RNCM support for client]        Advanced Directives Status: See [employer] application for related entries.  CCM Care Plan  No Known Allergies  Outpatient Encounter Medications as of 08/07/2021  Medication Sig   apixaban (ELIQUIS) 5 MG TABS tablet Take 1 tablet (5 mg total) by  mouth 2 (two) times daily.   cholecalciferol (VITAMIN D) 25 MCG (1000 UNIT) tablet Take 2,000 Units by mouth in the morning.   furosemide (LASIX) 20 MG tablet Take 1 tablet (20 mg total) by mouth daily. (Patient taking differently: Take 20 mg by mouth in the morning.)   metoprolol succinate (TOPROL-XL) 25 MG 24 hr tablet Take 1 tablet (25 mg total) by mouth daily.   naproxen sodium (ALEVE) 220 MG tablet Take 220 mg by mouth daily as needed (knee pain.).   omeprazole (PRILOSEC) 40 MG capsule Take 1 capsule (40 mg total) by mouth daily.   potassium chloride SA (KLOR-CON) 20 MEQ tablet Take 1 tablet (20 mEq total) by mouth daily.   predniSONE (DELTASONE) 20 MG tablet 2 po at same time daily for 5 days   No facility-administered encounter medications on file as of 08/07/2021.    Patient Active Problem List   Diagnosis Date Noted   Cough 05/15/2021   Diabetes mellitus without complication (HCC) 10/19/2019   BMI 30.0-30.9,adult 01/12/2018   Gastroesophageal reflux disease without esophagitis 01/12/2018   Hypokalemia 01/12/2018   Hypertension 04/12/2013   Osteoarthritis of both knees 04/12/2013   Insomnia 11/22/2012   Cataract 08/04/2012    Conditions to be addressed/monitored: monitor client completion of ADLs. Monitor client management of medical needs  Care Plan : LCSW Care plan  Updates made by [name], LCSW since 08/07/2021 12:00 AM     Problem: Coping Skills (General Plan of Care)      Goal: Manage medical needs of client and manage daily activities of client   Start Date: 08/07/2021  Expected End Date: 11/05/2021  This  Visit's Progress: On track  Recent Progress: On track  Priority: Medium  Note:   Current barriers:   Patient in need of assistance with connecting to community resources for assistance in managing ongoing client medical conditions Patient is unable to independently navigate community resource options without care coordination support Mobility  issues Pain issues  Clinical Goals:  patient will work with SW in next 30 days  to address concerns related to client management of health needs faced and related to client completion of daily ADLs Client to call RNCM or LCSW  as needed for support in next 30 days Client to talk with LCSW in next 30 days related to mobility issues of client and related to pain issues of client   Clinical Interventions:  Collaboration with [name], FNP regarding development and update of comprehensive plan of care as evidenced by provider attestation and co-signature Discussed client needs with Rolan Bucco, son of client. Discussed with Tawanna Cooler sleeping issues of client. Todd said client stays up late at night and sometimes sleeps late in the mornings.  Encouraged client or Tawanna Cooler to call RNCM as needed for CCM nursing support for client Reviewed pain issues of client. Tawanna Cooler said client has pain in right knee. He said she has now been cleared for surgery (knee surgery).  He said she is waiting to hear when surgery will be scheduled for her.  Reviewed gout issues of client. Tawanna Cooler said client is still experiencing gout issues occasionally Discussed edema issues of client. Tawanna Cooler said client has periodic edema issues. Reviewed client support with cardiologist.    Patient Strengths:  Takes medications as prescribed Attends scheduled medical appointments Has support from her two sons Communicates well with others  Patient Deficits:  Challenges in managing medical needs Pain issues Mobility issues  Patient Goals:   Attend scheduled medical appointments Take medications as prescribed Call RNCM or LCSW as needed for support Call medical provider as needed for information about managing client medical issues faced -  Follow Up Plan: LCSW to call client  or son of client on 10/08/21 at 3:00 PM to assess client needs      Kelton Pillar.Kersti Scavone MSW, LCSW Licensed Clinical Social Worker [facility] Care  Management [phone]

## 2021-08-07 NOTE — Patient Instructions (Addendum)
Visit Information  Patient Goals: Protect My Health (Patient). Manage health needs of client and manage daily activities as needed  Timeframe:  Short-Term Goal Priority:  Medium Progress: On Track Start Date:        08/07/21                     Expected End Date:       11/05/21                Follow Up Date 10/08/21 at 3:00 PM   Protect My Health (Patient) Manage health needs of client and manage daily activities as needed    Why is this important?   Screening tests can find diseases early when they are easier to treat.  Your doctor or nurse will talk with you about which tests are important for you.  Getting shots for common diseases like the flu and shingles will help prevent them.      Patient Strengths:  Takes medications as prescribed Attends scheduled medical appointments Has support from her two sons Communicates well with others  Patient Deficits:  Challenges in managing medical needs Pain issues Mobility issues  Patient Goals:   Attend scheduled medical appointments Take medications as prescribed Call RNCM or LCSW as needed for support Call medical provider as needed for information about managing client medical issues faced -  Follow Up Plan: LCSW to call client or son of client on  10/08/21 at 3:00 PM to assess client needs   Kelton Pillar.Rayvin Abid MSW, LCSW Licensed Clinical Social Worker [facility] Care Management [phone]

## 2021-08-15 DIAGNOSIS — M170 Bilateral primary osteoarthritis of knee: Secondary | ICD-10-CM

## 2021-08-15 DIAGNOSIS — E119 Type 2 diabetes mellitus without complications: Secondary | ICD-10-CM

## 2021-08-15 DIAGNOSIS — I10 Essential (primary) hypertension: Secondary | ICD-10-CM

## 2021-08-26 ENCOUNTER — Other Ambulatory Visit: Payer: Self-pay | Admitting: Nurse Practitioner

## 2021-08-26 DIAGNOSIS — R609 Edema, unspecified: Secondary | ICD-10-CM

## 2021-09-11 ENCOUNTER — Encounter: Payer: Self-pay | Admitting: Family Medicine

## 2021-09-11 ENCOUNTER — Ambulatory Visit (INDEPENDENT_AMBULATORY_CARE_PROVIDER_SITE_OTHER): Payer: Medicare HMO | Admitting: Family Medicine

## 2021-09-11 DIAGNOSIS — H1033 Unspecified acute conjunctivitis, bilateral: Secondary | ICD-10-CM

## 2021-09-11 MED ORDER — ERYTHROMYCIN 5 MG/GM OP OINT: 1.0000 "application " | TOPICAL_OINTMENT | Freq: Four times a day (QID) | OPHTHALMIC | 0 refills | Status: AC

## 2021-09-11 NOTE — Progress Notes (Signed)
Virtual Visit via Telephone Note  I connected with Norma Barton on 09/11/21 at 1:33 PM by telephone and verified that I am speaking with the correct person using two identifiers. Norma Barton is currently located at home and her son is currently with her during this visit. The provider, Gwenlyn Fudge, FNP is located in their office at time of visit.  I discussed the limitations, risks, security and privacy concerns of performing an evaluation and management service by telephone and the availability of in person appointments. I also discussed with the patient that there may be a patient responsible charge related to this service. The patient expressed understanding and agreed to proceed.  Subjective: PCP: [name], FNP  Chief Complaint  Patient presents with   URI   Patient complains of cough, sore throat, fever, and bilateral eye redness/matted/drainage . Onset of symptoms was 1 week ago, gradually improving since that time, except her eyes. She is drinking plenty of fluids. Evaluation to date: at home COVID test negative. Treatment to date: cough suppressants and Tylenol . She does not smoke.    ROS: Per HPI  Current Outpatient Medications:    apixaban (ELIQUIS) 5 MG TABS tablet, Take 1 tablet (5 mg total) by mouth 2 (two) times daily., Disp: 60 tablet, Rfl: 6   cholecalciferol (VITAMIN D) 25 MCG (1000 UNIT) tablet, Take 2,000 Units by mouth in the morning., Disp: , Rfl:    furosemide (LASIX) 20 MG tablet, TAKE 1 TABLET BY MOUTH EVERY DAY, Disp: 90 tablet, Rfl: 0   metoprolol succinate (TOPROL-XL) 25 MG 24 hr tablet, Take 1 tablet (25 mg total) by mouth daily., Disp: 90 tablet, Rfl: 1   naproxen sodium (ALEVE) 220 MG tablet, Take 220 mg by mouth daily as needed (knee pain.)., Disp: , Rfl:    omeprazole (PRILOSEC) 40 MG capsule, Take 1 capsule (40 mg total) by mouth daily., Disp: 90 capsule, Rfl: 1   potassium chloride SA (KLOR-CON) 20 MEQ tablet, Take 1 tablet (20  mEq total) by mouth daily., Disp: 90 tablet, Rfl: 1  No Known Allergies Past Medical History:  Diagnosis Date   Diabetes mellitus without complication (HCC)    GERD (gastroesophageal reflux disease)    Hypertension     Observations/Objective: A&O  No respiratory distress or wheezing audible over the phone Mood, judgement, and thought processes all WNL  Assessment and Plan: 1. Acute bacterial conjunctivitis of both eyes Lots of hand washing. No make-up. Discussed contagious.  - erythromycin ophthalmic ointment; Place 1 application into both eyes 4 (four) times daily for 7 days.  Dispense: 3.5 g; Refill: 0   Follow Up Instructions:  I discussed the assessment and treatment plan with the patient. The patient was provided an opportunity to ask questions and all were answered. The patient agreed with the plan and demonstrated an understanding of the instructions.   The patient was advised to call back or seek an in-person evaluation if the symptoms worsen or if the condition fails to improve as anticipated.  The above assessment and management plan was discussed with the patient. The patient verbalized understanding of and has agreed to the management plan. Patient is aware to call the clinic if symptoms persist or worsen. Patient is aware when to return to the clinic for a follow-up visit. Patient educated on when it is appropriate to go to the emergency department.   Time call ended: 1:44 PM  I provided 11 minutes of non-face-to-face time during this encounter.  [name], MSN, APRN, FNP-C [facility] 09/11/21

## 2021-09-12 ENCOUNTER — Ambulatory Visit: Payer: Medicare HMO | Admitting: Nurse Practitioner

## 2021-09-13 ENCOUNTER — Telehealth: Payer: Self-pay | Admitting: Nurse Practitioner

## 2021-09-13 NOTE — Telephone Encounter (Signed)
Left message for patient to call back and schedule Medicare Annual Wellness Visit (AWV) to be completed by video or phone.   Last AWV: 01/31/2020  Please schedule at anytime with [facility] Nurse Health Advisor   Germaine Pomfret  45 minute appointment  Any questions, please contact me at [phone]

## 2021-10-08 ENCOUNTER — Telehealth: Payer: Medicare HMO

## 2021-10-08 ENCOUNTER — Telehealth: Payer: Self-pay | Admitting: Nurse Practitioner

## 2021-10-10 ENCOUNTER — Telehealth: Payer: Self-pay | Admitting: Nurse Practitioner

## 2021-10-10 NOTE — Telephone Encounter (Signed)
Left message for patient to call back and schedule Medicare Annual Wellness Visit (AWV) to be completed by video or phone.   Last AWV: 01/31/2020  Please schedule at anytime with [place]  45 minute appointment  Any questions, please contact me at [phone]

## 2021-11-02 ENCOUNTER — Encounter: Payer: Self-pay | Admitting: Nurse Practitioner

## 2021-11-02 ENCOUNTER — Ambulatory Visit (INDEPENDENT_AMBULATORY_CARE_PROVIDER_SITE_OTHER): Payer: Medicare HMO | Admitting: Nurse Practitioner

## 2021-11-02 ENCOUNTER — Ambulatory Visit: Payer: Medicare HMO | Admitting: Nurse Practitioner

## 2021-11-02 VITALS — BP 132/78 | HR 83 | Temp 97.5°F | Resp 20 | Ht 62.0 in | Wt 160.0 lb

## 2021-11-02 DIAGNOSIS — Z6830 Body mass index (BMI) 30.0-30.9, adult: Secondary | ICD-10-CM

## 2021-11-02 DIAGNOSIS — K219 Gastro-esophageal reflux disease without esophagitis: Secondary | ICD-10-CM | POA: Diagnosis not present

## 2021-11-02 DIAGNOSIS — E876 Hypokalemia: Secondary | ICD-10-CM

## 2021-11-02 DIAGNOSIS — I480 Paroxysmal atrial fibrillation: Secondary | ICD-10-CM | POA: Diagnosis not present

## 2021-11-02 DIAGNOSIS — I10 Essential (primary) hypertension: Secondary | ICD-10-CM

## 2021-11-02 DIAGNOSIS — L409 Psoriasis, unspecified: Secondary | ICD-10-CM | POA: Diagnosis not present

## 2021-11-02 DIAGNOSIS — E119 Type 2 diabetes mellitus without complications: Secondary | ICD-10-CM | POA: Diagnosis not present

## 2021-11-02 DIAGNOSIS — F5101 Primary insomnia: Secondary | ICD-10-CM

## 2021-11-02 DIAGNOSIS — R69 Illness, unspecified: Secondary | ICD-10-CM | POA: Diagnosis not present

## 2021-11-02 LAB — BAYER DCA HB A1C WAIVED: HB A1C (BAYER DCA - WAIVED): 6.7 % — ABNORMAL HIGH (ref 4.8–5.6)

## 2021-11-02 MED ORDER — APIXABAN 5 MG PO TABS: 5.0000 mg | ORAL_TABLET | Freq: Two times a day (BID) | ORAL | 6 refills | Status: DC

## 2021-11-02 MED ORDER — METOPROLOL SUCCINATE ER 25 MG PO TB24: 25.0000 mg | ORAL_TABLET | Freq: Every day | ORAL | 1 refills | Status: DC

## 2021-11-02 MED ORDER — OMEPRAZOLE 40 MG PO CPDR: 40.0000 mg | DELAYED_RELEASE_CAPSULE | Freq: Every day | ORAL | 1 refills | Status: DC

## 2021-11-02 MED ORDER — POTASSIUM CHLORIDE CRYS ER 20 MEQ PO TBCR: 20.0000 meq | EXTENDED_RELEASE_TABLET | Freq: Every day | ORAL | 1 refills | Status: DC

## 2021-11-02 MED ORDER — TRIAMCINOLONE ACETONIDE 0.1 % EX CREA: 1.0000 "application " | TOPICAL_CREAM | Freq: Two times a day (BID) | CUTANEOUS | 2 refills | Status: DC

## 2021-11-02 NOTE — Patient Instructions (Signed)

## 2021-11-02 NOTE — Progress Notes (Signed)
Subjective:    Patient ID: Norma Barton, female    DOB: 1935/04/08, 87 y.o.   MRN: [id number]  Chief Complaint: medical management of chronic issues     HPI:  Norma Barton is a 87 y.o. who identifies as a female who was assigned female at birth.   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 Primary hypertension No c/o chest pain , sob or headache. Doe snot check blood pressure at home. BP Readings from Last 3 Encounters:  11/02/21 132/78  06/20/21 110/70  05/31/21 112/73     2. Diabetes mellitus without complication ([place]) She doe snot check her blood sugars at home. Eats whatever she wants to. Lab Results  Component Value Date   HGBA1C 6.0 05/01/2021     3. Gastroesophageal reflux disease without esophagitis Is on omeprazole daily and is doing well  4. Hypokalemia No c/o cramping Lab Results  Component Value Date   K 3.7 05/01/2021     5. Primary insomnia Not sleeping ell because she takes long naps during the day.  6. Atrial fib Is on eliquis an dis doing well. No palpitations or heart racing.    7. BMI 30.0-30.9,adult No recent weight changes  Wt Readings from Last 3 Encounters:  11/02/21 160 lb (72.6 kg)  06/20/21 161 lb (73 kg)  05/31/21 159 lb (72.1 kg)   BMI Readings from Last 3 Encounters:  11/02/21 29.26 kg/m  06/20/21 29.45 kg/m  05/31/21 29.08 kg/m      New complaints: Has a knor on her upper left arm that causes pain when she moves her arm.  No Known Allergies Outpatient Encounter Medications as of 11/02/2021  Medication Sig   apixaban (ELIQUIS) 5 MG TABS tablet Take 1 tablet (5 mg total) by mouth 2 (two) times daily.   cholecalciferol (VITAMIN D) 25 MCG (1000 UNIT) tablet Take 2,000 Units by mouth in the morning.   furosemide (LASIX) 20 MG tablet TAKE 1 TABLET BY MOUTH EVERY DAY   metoprolol succinate (TOPROL-XL) 25 MG 24  hr tablet Take 1 tablet (25 mg total) by mouth daily.   naproxen sodium (ALEVE) 220 MG tablet Take 220 mg by mouth daily as needed (knee pain.).   omeprazole (PRILOSEC) 40 MG capsule Take 1 capsule (40 mg total) by mouth daily.   potassium chloride SA (KLOR-CON) 20 MEQ tablet Take 1 tablet (20 mEq total) by mouth daily.   No facility-administered encounter medications on file as of 11/02/2021.    Past Surgical History:  Procedure Laterality Date   ABDOMINAL HYSTERECTOMY     arthroscopy right knee     TOTAL KNEE ARTHROPLASTY Left 08/15/2016   Procedure: TOTAL KNEE ARTHROPLASTY;  Surgeon: Carole Civil, MD;  Location: [facility];  Service: Orthopedics;  Laterality: Left;    Family History  Problem Relation Age of Onset   Parkinson's disease Father    Breast cancer Sister       Controlled substance contract: n/a     Review of Systems  Constitutional:  Negative for diaphoresis.  Eyes:  Negative for pain.  Respiratory:  Negative for shortness of breath.   Cardiovascular:  Negative for chest pain, palpitations and leg swelling.  Gastrointestinal:  Negative for abdominal pain.  Endocrine: Negative for polydipsia.  Skin:  Negative for rash.  Neurological:  Negative for dizziness, weakness and headaches.  Hematological:  Does not bruise/bleed  easily.  All other systems reviewed and are negative.     Objective:   Physical Exam Vitals and nursing note reviewed.  Constitutional:      General: She is not in acute distress.    Appearance: Normal appearance. She is well-developed.  HENT:     Head: Normocephalic.     Right Ear: Tympanic membrane normal.     Left Ear: Tympanic membrane normal.     Nose: Nose normal.     Mouth/Throat:     Mouth: Mucous membranes are moist.  Eyes:     Pupils: Pupils are equal, round, and reactive to light.  Neck:     Vascular: No carotid bruit or JVD.  Cardiovascular:     Rate and Rhythm: Normal rate and regular rhythm.     Heart sounds:  Normal heart sounds.  Pulmonary:     Effort: Pulmonary effort is normal. No respiratory distress.     Breath sounds: Normal breath sounds. No wheezing or rales.  Chest:     Chest wall: No tenderness.  Abdominal:     General: Bowel sounds are normal. There is no distension or abdominal bruit.     Palpations: Abdomen is soft. There is no hepatomegaly, splenomegaly, mass or pulsatile mass.     Tenderness: There is no abdominal tenderness.  Musculoskeletal:        General: Normal range of motion.     Cervical back: Normal range of motion and neck supple.  Lymphadenopathy:     Cervical: No cervical adenopathy.  Skin:    General: Skin is warm and dry.     Comments: 5cm annular scaley lesions on bil forearms  Neurological:     Mental Status: She is alert and oriented to person, place, and time.     Deep Tendon Reflexes: Reflexes are normal and symmetric.  Psychiatric:        Behavior: Behavior normal.        Thought Content: Thought content normal.        Judgment: Judgment normal.    BP 132/78    Pulse 83    Temp (!) 97.5 F (36.4 C) (Oral)    Resp 20    Ht 5' 2"  (1.575 m)    Wt 160 lb (72.6 kg)    SpO2 95%    BMI 29.26 kg/m   Hgba1c 6.7%      Assessment & Plan:  Norma Barton comes in today with chief complaint of Medical Management of Chronic Issues   Diagnosis and orders addressed:  1. Primary hypertension Low sodium diet - CBC with Differential/Platelet - CMP14+EGFR - Lipid panel  2. Diabetes mellitus without complication ([place]) Continue to avoid lots of carbs in diet - Bayer DCA Hb A1c Waived  3. Gastroesophageal reflux disease without esophagitis Avoid spicy foods Do not eat 2 hours prior to bedtime  - omeprazole (PRILOSEC) 40 MG capsule; Take 1 capsule (40 mg total) by mouth daily.  Dispense: 90 capsule; Refill: 1  4. Hypokalemia Labs pending - potassium chloride SA (KLOR-CON M) 20 MEQ tablet; Take 1 tablet (20 mEq total) by mouth daily.  Dispense: 90  tablet; Refill: 1  5. Primary insomnia Bedtime routine  try  not to nap during the day  6. Paroxysmal atrial fibrillation (HCC) - metoprolol succinate (TOPROL-XL) 25 MG 24 hr tablet; Take 1 tablet (25 mg total) by mouth daily.  Dispense: 90 tablet; Refill: 1 - apixaban (ELIQUIS) 5 MG TABS tablet; Take 1 tablet (5 mg  total) by mouth 2 (two) times daily.  Dispense: 60 tablet; Refill: 6  7. BMI 30.0-30.9,adult Discussed diet and exercise for person with BMI >25 Will recheck weight in 3-6 months   8. Psoriasis Do not pick or scratch at areas - triamcinolone cream (KENALOG) 0.1 %; Apply 1 application topically 2 (two) times daily.  Dispense: 45 g; Refill: 2   Labs pending Health Maintenance reviewed Diet and exercise encouraged  Follow up plan: 6 months   [name] Done, FNP

## 2021-11-03 LAB — CBC WITH DIFFERENTIAL/PLATELET
Basophils Absolute: 0 10*3/uL (ref 0.0–0.2)
Basos: 1 %
EOS (ABSOLUTE): 0.1 10*3/uL (ref 0.0–0.4)
Eos: 2 %
Hematocrit: 44.2 % (ref 34.0–46.6)
Hemoglobin: 15.3 g/dL (ref 11.1–15.9)
Immature Grans (Abs): 0 10*3/uL (ref 0.0–0.1)
Immature Granulocytes: 0 %
Lymphocytes Absolute: 2.4 10*3/uL (ref 0.7–3.1)
Lymphs: 47 %
MCH: 33.3 pg — ABNORMAL HIGH (ref 26.6–33.0)
MCHC: 34.6 g/dL (ref 31.5–35.7)
MCV: 96 fL (ref 79–97)
Monocytes Absolute: 0.4 10*3/uL (ref 0.1–0.9)
Monocytes: 8 %
Neutrophils Absolute: 2.1 10*3/uL (ref 1.4–7.0)
Neutrophils: 42 %
Platelets: 142 10*3/uL — ABNORMAL LOW (ref 150–450)
RBC: 4.59 x10E6/uL (ref 3.77–5.28)
RDW: 12.1 % (ref 11.7–15.4)
WBC: 5.1 10*3/uL (ref 3.4–10.8)

## 2021-11-03 LAB — CMP14+EGFR
ALT: 32 IU/L (ref 0–32)
AST: 52 IU/L — ABNORMAL HIGH (ref 0–40)
Albumin/Globulin Ratio: 1.4 (ref 1.2–2.2)
Albumin: 3.7 g/dL (ref 3.6–4.6)
Alkaline Phosphatase: 88 IU/L (ref 44–121)
BUN/Creatinine Ratio: 17 (ref 12–28)
BUN: 17 mg/dL (ref 8–27)
Bilirubin Total: 0.4 mg/dL (ref 0.0–1.2)
CO2: 28 mmol/L (ref 20–29)
Calcium: 9.7 mg/dL (ref 8.7–10.3)
Chloride: 104 mmol/L (ref 96–106)
Creatinine, Ser: 1.01 mg/dL — ABNORMAL HIGH (ref 0.57–1.00)
Globulin, Total: 2.6 g/dL (ref 1.5–4.5)
Glucose: 182 mg/dL — ABNORMAL HIGH (ref 70–99)
Potassium: 3.7 mmol/L (ref 3.5–5.2)
Sodium: 145 mmol/L — ABNORMAL HIGH (ref 134–144)
Total Protein: 6.3 g/dL (ref 6.0–8.5)
eGFR: 55 mL/min/{1.73_m2} — ABNORMAL LOW (ref >59)

## 2021-11-03 LAB — LIPID PANEL
Chol/HDL Ratio: 2.6 ratio (ref 0.0–4.4)
Cholesterol, Total: 136 mg/dL (ref 100–199)
HDL: 53 mg/dL (ref >39)
LDL Chol Calc (NIH): 61 mg/dL (ref 0–99)
Triglycerides: 128 mg/dL (ref 0–149)
VLDL Cholesterol Cal: 22 mg/dL (ref 5–40)

## 2021-11-05 ENCOUNTER — Ambulatory Visit (INDEPENDENT_AMBULATORY_CARE_PROVIDER_SITE_OTHER): Payer: Medicare HMO

## 2021-11-05 VITALS — Wt 160.0 lb

## 2021-11-05 DIAGNOSIS — Z1231 Encounter for screening mammogram for malignant neoplasm of breast: Secondary | ICD-10-CM | POA: Diagnosis not present

## 2021-11-05 DIAGNOSIS — Z0000 Encounter for general adult medical examination without abnormal findings: Secondary | ICD-10-CM

## 2021-11-05 NOTE — Patient Instructions (Signed)
Norma Barton , Thank you for taking time to come for your Medicare Wellness Visit. I appreciate your ongoing commitment to your health goals. Please review the following plan we discussed and let me know if I can assist you in the future.   Screening recommendations/referrals: Colonoscopy: No longer required Mammogram: Done 12/24/2019 - Repeat annually *past due - ordered today - patient prefers [facility] Bone Density: Done 03/10/2017 - Repeat in 5 years  Recommended yearly ophthalmology/optometry visit for glaucoma screening and checkup Recommended yearly dental visit for hygiene and checkup  Vaccinations: Influenza vaccine: Done 05/31/2021 - Repeat annually Pneumococcal vaccine: Ask About [id number] - once per lifetime Tdap vaccine: DUE - recommended every 10 years Shingles vaccine: DUE - recommended 2 doses 2-6 months apart for over 90% efficacy   Covid-19:Done 04/25/2020 & 05/23/2020 - for additional boosters - contact pharmacy  Advanced directives: Please bring a copy of your health care power of attorney and living will to the office to be added to your chart at your convenience.   Conditions/risks identified: Aim for 30 minutes of exercise or brisk walking each day, drink 6-8 glasses of water and eat lots of fruits and vegetables.   Next appointment: Follow up in one year for your annual wellness visit    Preventive Care 65 Years and Older, Female Preventive care refers to lifestyle choices and visits with your health care provider that can promote health and wellness. What does preventive care include? A yearly physical exam. This is also called an annual well check. Dental exams once or twice a year. Routine eye exams. Ask your health care provider how often you should have your eyes checked. Personal lifestyle choices, including: Daily care of your teeth and gums. Regular physical activity. Eating a healthy diet. Avoiding tobacco and drug use. Limiting alcohol  use. Practicing safe sex. Taking low-dose aspirin every day. Taking vitamin and mineral supplements as recommended by your health care provider. What happens during an annual well check? The services and screenings done by your health care provider during your annual well check will depend on your age, overall health, lifestyle risk factors, and family history of disease. Counseling  Your health care provider may ask you questions about your: Alcohol use. Tobacco use. Drug use. Emotional well-being. Home and relationship well-being. Sexual activity. Eating habits. History of falls. Memory and ability to understand (cognition). Work and work Statistician. Reproductive health. Screening  You may have the following tests or measurements: Height, weight, and BMI. Blood pressure. Lipid and cholesterol levels. These may be checked every 5 years, or more frequently if you are over 19 years old. Skin check. Lung cancer screening. You may have this screening every year starting at age 33 if you have a 30-pack-year history of smoking and currently smoke or have quit within the past 15 years. Fecal occult blood test (FOBT) of the stool. You may have this test every year starting at age 33. Flexible sigmoidoscopy or colonoscopy. You may have a sigmoidoscopy every 5 years or a colonoscopy every 10 years starting at age 78. Hepatitis C blood test. Hepatitis B blood test. Sexually transmitted disease (STD) testing. Diabetes screening. This is done by checking your blood sugar (glucose) after you have not eaten for a while (fasting). You may have this done every 1-3 years. Bone density scan. This is done to screen for osteoporosis. You may have this done starting at age 77. Mammogram. This may be done every 1-2 years. Talk to your health care  provider about how often you should have regular mammograms. Talk with your health care provider about your test results, treatment options, and if necessary,  the need for more tests. Vaccines  Your health care provider may recommend certain vaccines, such as: Influenza vaccine. This is recommended every year. Tetanus, diphtheria, and acellular pertussis (Tdap, Td) vaccine. You may need a Td booster every 10 years. Zoster vaccine. You may need this after age 4. Pneumococcal 13-valent conjugate (PCV13) vaccine. One dose is recommended after age 84. Pneumococcal polysaccharide (PPSV23) vaccine. One dose is recommended after age 34. Talk to your health care provider about which screenings and vaccines you need and how often you need them. This information is not intended to replace advice given to you by your health care provider. Make sure you discuss any questions you have with your health care provider. Document Released: 09/29/2015 Document Revised: 05/22/2016 Document Reviewed: 07/04/2015 Elsevier Interactive Patient Education  2017 [employer] Prevention in the Home Falls can cause injuries. They can happen to people of all ages. There are many things you can do to make your home safe and to help prevent falls. What can I do on the outside of my home? Regularly fix the edges of walkways and driveways and fix any cracks. Remove anything that might make you trip as you walk through a door, such as a raised step or threshold. Trim any bushes or trees on the path to your home. Use bright outdoor lighting. Clear any walking paths of anything that might make someone trip, such as rocks or tools. Regularly check to see if handrails are loose or broken. Make sure that both sides of any steps have handrails. Any raised decks and porches should have guardrails on the edges. Have any leaves, snow, or ice cleared regularly. Use sand or salt on walking paths during winter. Clean up any spills in your garage right away. This includes oil or grease spills. What can I do in the bathroom? Use night lights. Install grab bars by the toilet and in the  tub and shower. Do not use towel bars as grab bars. Use non-skid mats or decals in the tub or shower. If you need to sit down in the shower, use a plastic, non-slip stool. Keep the floor dry. Clean up any water that spills on the floor as soon as it happens. Remove soap buildup in the tub or shower regularly. Attach bath mats securely with double-sided non-slip rug tape. Do not have throw rugs and other things on the floor that can make you trip. What can I do in the bedroom? Use night lights. Make sure that you have a light by your bed that is easy to reach. Do not use any sheets or blankets that are too big for your bed. They should not hang down onto the floor. Have a firm chair that has side arms. You can use this for support while you get dressed. Do not have throw rugs and other things on the floor that can make you trip. What can I do in the kitchen? Clean up any spills right away. Avoid walking on wet floors. Keep items that you use a lot in easy-to-reach places. If you need to reach something above you, use a strong step stool that has a grab bar. Keep electrical cords out of the way. Do not use floor polish or wax that makes floors slippery. If you must use wax, use non-skid floor wax. Do not have throw rugs  and other things on the floor that can make you trip. What can I do with my stairs? Do not leave any items on the stairs. Make sure that there are handrails on both sides of the stairs and use them. Fix handrails that are broken or loose. Make sure that handrails are as long as the stairways. Check any carpeting to make sure that it is firmly attached to the stairs. Fix any carpet that is loose or worn. Avoid having throw rugs at the top or bottom of the stairs. If you do have throw rugs, attach them to the floor with carpet tape. Make sure that you have a light switch at the top of the stairs and the bottom of the stairs. If you do not have them, ask someone to add them for  you. What else can I do to help prevent falls? Wear shoes that: Do not have high heels. Have rubber bottoms. Are comfortable and fit you well. Are closed at the toe. Do not wear sandals. If you use a stepladder: Make sure that it is fully opened. Do not climb a closed stepladder. Make sure that both sides of the stepladder are locked into place. Ask someone to hold it for you, if possible. Clearly mark and make sure that you can see: Any grab bars or handrails. First and last steps. Where the edge of each step is. Use tools that help you move around (mobility aids) if they are needed. These include: Canes. Walkers. Scooters. Crutches. Turn on the lights when you go into a dark area. Replace any light bulbs as soon as they burn out. Set up your furniture so you have a clear path. Avoid moving your furniture around. If any of your floors are uneven, fix them. If there are any pets around you, be aware of where they are. Review your medicines with your doctor. Some medicines can make you feel dizzy. This can increase your chance of falling. Ask your doctor what other things that you can do to help prevent falls. This information is not intended to replace advice given to you by your health care provider. Make sure you discuss any questions you have with your health care provider. Document Released: 06/29/2009 Document Revised: 02/08/2016 Document Reviewed: 10/07/2014 Elsevier Interactive Patient Education  2017 [employer].

## 2021-11-05 NOTE — Progress Notes (Signed)
Subjective:   Norma Barton is a 87 y.o. female who presents for Medicare Annual (Subsequent) preventive examination.  Virtual Visit via Telephone Note  I connected with  Norma Barton on 11/05/21 at  2:45 PM EST by telephone and verified that I am speaking with the correct person using two identifiers.  Location: Patient: Home Provider: [facility] Persons participating in the virtual visit: patient/Nurse Health Advisor   I discussed the limitations, risks, security and privacy concerns of performing an evaluation and management service by telephone and the availability of in person appointments. The patient expressed understanding and agreed to proceed.  Interactive audio and video telecommunications were attempted between this nurse and patient, however failed, due to patient having technical difficulties OR patient did not have access to video capability.  We continued and completed visit with audio only.  Some vital signs may be absent or patient reported.   Lynanne Delgreco E Alesi Zachery, LPN   Review of Systems     Cardiac Risk Factors include: advanced age (>6355men, 19>65 women);diabetes mellitus;sedentary lifestyle;hypertension;Other (see comment), Risk factor comments: A.Fib     Objective:    Today's Vitals   11/05/21 1447  Weight: 160 lb (72.6 kg)   Body mass index is 29.26 kg/m.  Advanced Directives 11/05/2021 04/13/2021 01/31/2020 09/12/2016 08/15/2016 08/12/2016  Does Patient Have a Medical Advance Directive? No No No No No No  Would patient like information on creating a medical advance directive? No - Patient declined No - Patient declined No - Patient declined - No - Patient declined Yes (MAU/Ambulatory/Procedural Areas - Information given)    Current Medications (verified) Outpatient Encounter Medications as of 11/05/2021  Medication Sig   apixaban (ELIQUIS) 5 MG TABS tablet Take 1 tablet (5 mg total) by mouth 2 (two) times daily.   cholecalciferol (VITAMIN D) 25 MCG (1000  UNIT) tablet Take 2,000 Units by mouth in the morning.   furosemide (LASIX) 20 MG tablet TAKE 1 TABLET BY MOUTH EVERY DAY   metoprolol succinate (TOPROL-XL) 25 MG 24 hr tablet Take 1 tablet (25 mg total) by mouth daily.   naproxen sodium (ALEVE) 220 MG tablet Take 220 mg by mouth daily as needed (knee pain.).   omeprazole (PRILOSEC) 40 MG capsule Take 1 capsule (40 mg total) by mouth daily.   potassium chloride SA (KLOR-CON M) 20 MEQ tablet Take 1 tablet (20 mEq total) by mouth daily.   triamcinolone cream (KENALOG) 0.1 % Apply 1 application topically 2 (two) times daily.   No facility-administered encounter medications on file as of 11/05/2021.    Allergies (verified) Patient has no known allergies.   History: Past Medical History:  Diagnosis Date   Diabetes mellitus without complication (HCC)    GERD (gastroesophageal reflux disease)    Hypertension    Past Surgical History:  Procedure Laterality Date   ABDOMINAL HYSTERECTOMY     arthroscopy right knee     TOTAL KNEE ARTHROPLASTY Left 08/15/2016   Procedure: TOTAL KNEE ARTHROPLASTY;  Surgeon: Vickki HearingStanley E Harrison, MD;  Location: [facility];  Service: Orthopedics;  Laterality: Left;   Family History  Problem Relation Age of Onset   Parkinson's disease Father    Breast cancer Sister    Social History   Socioeconomic History   Marital status: Widowed    Spouse name: Not on file   Number of children: 5   Years of education: 7112   Highest education level: 12th grade  Occupational History   Occupation: retired  Tobacco Use  Smoking status: Never   Smokeless tobacco: Never  Substance and Sexual Activity   Alcohol use: No   Drug use: No   Sexual activity: Not Currently  Other Topics Concern   Not on file  Social History Narrative   Lives alone in apartment   Has 5 sons - they all live within 30 miles or so   Had one daughter who died right after birth (cord wrapped around neck at birth)   Social Determinants of Health    Financial Resource Strain: Low Risk    Difficulty of Paying Living Expenses: Not hard at all  Food Insecurity: No Food Insecurity   Worried About Programme researcher, broadcasting/film/video in the Last Year: Never true   Barista in the Last Year: Never true  Transportation Needs: No Transportation Needs   Lack of Transportation (Medical): No   Lack of Transportation (Non-Medical): No  Physical Activity: Insufficiently Active   Days of Exercise per Week: 3 days   Minutes of Exercise per Session: 30 min  Stress: No Stress Concern Present   Feeling of Stress : Not at all  Social Connections: Moderately Isolated   Frequency of Communication with Friends and Family: More than three times a week   Frequency of Social Gatherings with Friends and Family: More than three times a week   Attends Religious Services: Never   Database administrator or Organizations: Yes   Attends Engineer, structural: More than 4 times per year   Marital Status: Widowed    Tobacco Counseling Counseling given: Not Answered   Clinical Intake:  Pre-visit preparation completed: Yes  Pain : No/denies pain     BMI - recorded: 29.26 Nutritional Status: BMI 25 -29 Overweight Nutritional Risks: Nausea/ vomitting/ diarrhea (due to GERD) Diabetes: Yes CBG done?: No Did pt. bring in CBG monitor from home?: No  How often do you need to have someone help you when you read instructions, pamphlets, or other written materials from your doctor or pharmacy?: 1 - Never  Diabetic? Nutrition Risk Assessment:  Has the patient had any N/V/D within the last 2 months?  No  Does the patient have any non-healing wounds?  No  Has the patient had any unintentional weight loss or weight gain?  No   Diabetes:  Is the patient diabetic?  Yes  If diabetic, was a CBG obtained today?  No  Did the patient bring in their glucometer from home?  No  How often do you monitor your CBG's? never.   Financial Strains and Diabetes  Management:  Are you having any financial strains with the device, your supplies or your medication? No .  Does the patient want to be seen by Chronic Care Management for management of their diabetes?  No  Would the patient like to be referred to a Nutritionist or for Diabetic Management?  No   Diabetic Exams:  Diabetic Eye Exam: Completed 2022.   Diabetic Foot Exam: Completed 10/20/2020. Pt has been advised about the importance in completing this exam. Pt is scheduled for diabetic foot exam on next appt.    Interpreter Needed?: No  Information entered by :: Jago Carton, LPN   Activities of Daily Living In your present state of health, do you have any difficulty performing the following activities: 11/05/2021 05/31/2021  Hearing? N N  Vision? N N  Difficulty concentrating or making decisions? N N  Walking or climbing stairs? Y N  Comment hurt knees -  Dressing  or bathing? N N  Doing errands, shopping? N N  Preparing Food and eating ? N -  Using the Toilet? N -  In the past six months, have you accidently leaked urine? Y -  Comment urge incontinence and sometimes hesitancy -  Do you have problems with loss of bowel control? N -  Managing your Medications? N -  Managing your Finances? N -  Housekeeping or managing your Housekeeping? N -  Some recent data might be hidden    Patient Care Team: [name], FNP as PCP - General (Nurse Practitioner) Antoine Poche, MD as PCP - Cardiology (Cardiology) Gwenith Daily, RN as Registered Nurse Randa Spike, Kelton Pillar, LCSW as Social Worker (Licensed Clinical Social Worker) Conley Rolls, Westley Chandler, MD as Referring Physician (Optometry) Vickki Hearing, MD as Consulting Physician (Orthopedic Surgery)  Indicate any recent Medical Services you may have received from other than [facility] providers in the past year (date may be approximate).     Assessment:   This is a routine wellness examination for Norma Barton.  Hearing/Vision  screen Hearing Screening - Comments:: Denies hearing difficulties   Vision Screening - Comments:: Wears rx glasses - up to date with routine eye exams with [facility] in [place]  Dietary issues and exercise activities discussed: Current Exercise Habits: Home exercise routine, Type of exercise: walking, Time (Minutes): 30, Frequency (Times/Week): 3, Weekly Exercise (Minutes/Week): 90, Intensity: Mild, Exercise limited by: orthopedic condition(s)   Goals Addressed             This Visit's Progress    Patient Stated   On track    01/31/2020 AWV Goal: Keep All Scheduled Appointments  Over the next year, patient will attend all scheduled appointments with their PCP and any specialists that they see.        Depression Screen PHQ 2/9 Scores 11/05/2021 11/02/2021 08/07/2021 05/31/2021 05/08/2021 05/01/2021 03/28/2021  PHQ - 2 Score 0 0 1 0 2 0 0  PHQ- 9 Score 0 0 5 - 6 0 3    Fall Risk Fall Risk  11/05/2021 11/02/2021 05/31/2021 05/01/2021 10/20/2020  Falls in the past year? 0 0 0 0 0  Number falls in past yr: 0 - - - -  Injury with Fall? 0 - - - -  Risk for fall due to : Orthopedic patient - - - -  Follow up Falls prevention discussed Falls evaluation completed - - -    FALL RISK PREVENTION PERTAINING TO THE HOME:  Any stairs in or around the home? Yes  - also has ramp If so, are there any without handrails? No  Home free of loose throw rugs in walkways, pet beds, electrical cords, etc? Yes  Adequate lighting in your home to reduce risk of falls? Yes   ASSISTIVE DEVICES UTILIZED TO PREVENT FALLS:  Life alert? No  Use of a cane, walker or w/c? Yes  Grab bars in the bathroom? Yes  Shower chair or bench in shower? No  Elevated toilet seat or a handicapped toilet? No   TIMED UP AND GO:  Was the test performed? No . Telephonic visit  Cognitive Function: Normal cognitive status assessed by direct observation by this Nurse Health Advisor. No abnormalities found.      6CIT Screen  01/31/2020  What Year? 0 points  What month? 0 points  What time? 0 points  Count back from 20 0 points  Months in reverse 0 points  Repeat phrase 0 points  Total  Score 0    Immunizations Immunization History  Administered Date(s) Administered   Fluad Quad(high Dose 65+) 07/23/2019, 10/23/2020, 05/31/2021   Influenza, High Dose Seasonal PF 08/21/2017, 07/14/2018   Influenza,inj,Quad PF,6+ Mos 06/25/2016   Moderna Sars-Covid-2 Vaccination 04/25/2020, 05/23/2020    TDAP status: Due, Education has been provided regarding the importance of this vaccine. Advised may receive this vaccine at local pharmacy or Health Dept. Aware to provide a copy of the vaccination record if obtained from local pharmacy or Health Dept. Verbalized acceptance and understanding.  Flu Vaccine status: Up to date  Pneumococcal vaccine status: Due, Education has been provided regarding the importance of this vaccine. Advised may receive this vaccine at local pharmacy or Health Dept. Aware to provide a copy of the vaccination record if obtained from local pharmacy or Health Dept. Verbalized acceptance and understanding.  Covid-19 vaccine status: Completed vaccines  Qualifies for Shingles Vaccine? Yes   Zostavax completed No   Shingrix Completed?: No.    Education has been provided regarding the importance of this vaccine. Patient has been advised to call insurance company to determine out of pocket expense if they have not yet received this vaccine. Advised may also receive vaccine at local pharmacy or Health Dept. Verbalized acceptance and understanding.  Screening Tests Health Maintenance  Topic Date Due   OPHTHALMOLOGY EXAM  Never done   URINE MICROALBUMIN  Never done   COVID-19 Vaccine (3 - Booster for Moderna series) 07/18/2020   MAMMOGRAM  12/23/2020   FOOT EXAM  10/20/2021   Zoster Vaccines- Shingrix (1 of 2) 01/30/2022 (Originally 02/23/1987)   Pneumonia Vaccine 99+ Years old (1 - PCV) 11/02/2022  (Originally 02/22/2002)   TETANUS/TDAP  11/02/2022 (Originally 02/23/1954)   DEXA SCAN  03/10/2022   HEMOGLOBIN A1C  05/02/2022   INFLUENZA VACCINE  Completed   HPV VACCINES  Aged Out    Health Maintenance  Health Maintenance Due  Topic Date Due   OPHTHALMOLOGY EXAM  Never done   URINE MICROALBUMIN  Never done   COVID-19 Vaccine (3 - Booster for Moderna series) 07/18/2020   MAMMOGRAM  12/23/2020   FOOT EXAM  10/20/2021    Colorectal cancer screening: No longer required.   Mammogram status: Ordered 11/05/2021. Pt provided with contact info and advised to call to schedule appt.   Bone Density status: Completed 03/10/2017. Results reflect: Bone density results: NORMAL. Repeat every 5 years.  Lung Cancer Screening: (Low Dose CT Chest recommended if Age 81-80 years, 30 pack-year currently smoking OR have quit w/in 15years.) does not qualify.  Additional Screening:  Hepatitis C Screening: does not qualify  Vision Screening: Recommended annual ophthalmology exams for early detection of glaucoma and other disorders of the eye. Is the patient up to date with their annual eye exam?  Yes  Who is the provider or what is the name of the office in which the patient attends annual eye exams? [facility] [place] If pt is not established with a provider, would they like to be referred to a provider to establish care? No .   Dental Screening: Recommended annual dental exams for proper oral hygiene  Community Resource Referral / Chronic Care Management: CRR required this visit?  No   CCM required this visit?  No      Plan:     I have personally reviewed and noted the following in the patients chart:   Medical and social history Use of alcohol, tobacco or illicit drugs  Current medications and supplements including  opioid prescriptions.  Functional ability and status Nutritional status Physical activity Advanced directives List of other physicians Hospitalizations, surgeries,  and ER visits in previous 12 months Vitals Screenings to include cognitive, depression, and falls Referrals and appointments  In addition, I have reviewed and discussed with patient certain preventive protocols, quality metrics, and best practice recommendations. A written personalized care plan for preventive services as well as general preventive health recommendations were provided to patient.     [place] Constablemy E Zyan Mirkin, LPN   1/61/09602/20/2023   Nurse Notes: none

## 2021-11-26 ENCOUNTER — Other Ambulatory Visit: Payer: Self-pay | Admitting: Nurse Practitioner

## 2021-11-26 DIAGNOSIS — R609 Edema, unspecified: Secondary | ICD-10-CM

## 2021-11-29 ENCOUNTER — Ambulatory Visit: Payer: Medicare HMO | Admitting: Orthopedic Surgery

## 2021-11-29 ENCOUNTER — Other Ambulatory Visit: Payer: Self-pay

## 2021-11-29 ENCOUNTER — Ambulatory Visit (INDEPENDENT_AMBULATORY_CARE_PROVIDER_SITE_OTHER): Payer: Medicare HMO

## 2021-11-29 DIAGNOSIS — G8929 Other chronic pain: Secondary | ICD-10-CM | POA: Diagnosis not present

## 2021-11-29 DIAGNOSIS — M25561 Pain in right knee: Secondary | ICD-10-CM

## 2021-11-29 DIAGNOSIS — Z7901 Long term (current) use of anticoagulants: Secondary | ICD-10-CM

## 2021-11-29 DIAGNOSIS — Z01818 Encounter for other preprocedural examination: Secondary | ICD-10-CM

## 2021-11-29 DIAGNOSIS — M1711 Unilateral primary osteoarthritis, right knee: Secondary | ICD-10-CM

## 2021-11-29 NOTE — Patient Instructions (Signed)
Surgery April 18th ? ?Stop eliquis on the 15th  ? ?You have decided to proceed with knee replacement surgery. You have decided not to continue with nonoperative measures such as but not limited to oral medication, weight loss, activity modification, physical therapy, bracing, or injection. ? ?We will perform the procedure commonly known as total knee replacement. Some of the risks associated with knee replacement surgery include but are not limited to ?Bleeding ?Infection ?Swelling ?Stiffness ?Blood clot ?Pulmonary embolism  ?Loosening of the implant ?Pain that persists even after surgery ? ?Infection is especially devastating complication of knee surgery although rare. If infection does occur your implant will usually have to be removed and several surgeries and antibiotics will be needed to eradicate the infection prior to performing a repeat replacement.  ? ?In some cases amputation is required to eradicate the infection. In other rare cases a knee fusion is needed  ? ?In compliance with recent [place] law in federal regulation regarding opioid use and abuse and addiction, we will taper (stop) opioid medication after 2 weeks. ? ?If you're not comfortable with these risks and would like to continue with nonoperative treatment please let Dr. Romeo Apple know prior to your surgery.  ?

## 2021-11-29 NOTE — Progress Notes (Signed)
? ?Established Patient Office Visit ? ?Subjective:  ?Patient ID: Norma Barton, female    DOB: 04-11-1935  Age: 87 y.o. MRN: [id number] ? ?CC:  ?Chief Complaint  ?Patient presents with  ? Knee Pain  ?  R/ wants to discuss surgery, because I can't stand the pain.  I would like to get an injection if possible.  ? ? ?HPI ?Norma Barton presents for 87 year old female did well with the left total knee came in for right total knee but preop work-up showed cardiac arrhythmia and surgery was canceled however she has been cleared by cardiology and wishes to proceed with a right total knee ? ?She is on Eliquis ? 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 ?Review of systems no chest pain shortness of breath no arrhythmias ? ?No trouble with bleeding or bruising ? ?Past Medical History:  ?Diagnosis Date  ? Diabetes mellitus without complication ([place])   ? GERD (gastroesophageal reflux disease)   ? Hypertension   ? ? ?Past Surgical History:  ?Procedure Laterality Date  ? ABDOMINAL HYSTERECTOMY    ? arthroscopy right knee    ? TOTAL KNEE ARTHROPLASTY Left 08/15/2016  ? Procedure: TOTAL KNEE ARTHROPLASTY;  Surgeon: Carole Civil, MD;  Location: [facility];  Service: Orthopedics;  Laterality: Left;  ? ? ?Family History  ?Problem Relation Age of Onset  ? Parkinson's disease Father   ? Breast cancer Sister   ? ? ?Social History  ? ?Socioeconomic History  ? Marital status: Widowed  ?  Spouse name: Not on file  ? Number of children: 5  ? Years of education: 79  ? Highest education level: 12th grade  ?Occupational History  ? Occupation: retired  ?Tobacco Use  ? Smoking status: Never  ? Smokeless tobacco: Never  ?Substance and Sexual Activity  ? Alcohol use: No  ? Drug use: No  ? Sexual activity: Not Currently  ?Other Topics Concern  ? Not on file  ?Social History Narrative  ? Lives alone in apartment   ? Has 5 sons - they all live within 30 miles or so  ? Had one daughter who died right after birth (cord wrapped around neck at birth)  ? ?Social Determinants of Health  ? ?Financial Resource Strain: Low Risk   ? Difficulty of Paying Living Expenses: Not hard at all  ?Food Insecurity: No Food Insecurity  ? Worried About Charity fundraiser in the Last Year: Never true  ? Ran Out of Food in the Last Year: Never true  ?Transportation Needs: No Transportation Needs  ? Lack of Transportation (Medical): No  ? Lack of Transportation (Non-Medical): No  ?Physical Activity: Insufficiently Active  ? Days of Exercise per Week: 3 days  ? Minutes of Exercise per Session: 30 min  ?Stress: No Stress Concern Present  ? Feeling of Stress : Not at all  ?Social Connections: Moderately Isolated  ? Frequency of Communication with Friends and Family: More than three times a week  ? Frequency of Social Gatherings with Friends and Family: More than three times a week  ? Attends Religious Services: Never  ? Active Member of Clubs or Organizations: Yes  ? Attends Archivist Meetings: More than 4 times per year  ? Marital Status: Widowed  ?Intimate Partner Violence: Not At Risk  ? Fear of Current or Ex-Partner: No  ?  Emotionally Abused: No  ? Physically Abused: No  ? Sexually Abused: No  ? ? ?Outpatient Medications Prior to Visit  ?Medication Sig Dispense Refill  ? apixaban (ELIQUIS) 5 MG TABS tablet Take 1 tablet (5 mg total) by mouth 2 (two) times daily. 60 tablet 6  ? cholecalciferol (VITAMIN D) 25 MCG (1000 UNIT) tablet Take 2,000 Units by mouth in the morning.    ? furosemide (LASIX) 20 MG tablet TAKE 1 TABLET BY MOUTH EVERY DAY 90 tablet 1  ? metoprolol succinate (TOPROL-XL) 25 MG 24 hr tablet Take 1 tablet (25 mg total) by mouth daily. 90 tablet 1  ? naproxen sodium (ALEVE) 220 MG tablet Take 220 mg by mouth daily as needed (knee pain.).    ? omeprazole (PRILOSEC) 40 MG capsule Take 1 capsule (40 mg total) by mouth daily. 90  capsule 1  ? potassium chloride SA (KLOR-CON M) 20 MEQ tablet Take 1 tablet (20 mEq total) by mouth daily. 90 tablet 1  ? triamcinolone cream (KENALOG) 0.1 % Apply 1 application topically 2 (two) times daily. 45 g 2  ? ?No facility-administered medications prior to visit.  ? ? ?No Known Allergies ? ?ROS ? ? ?  ?Objective:  ?  ?Physical Exam ?General appearance normal grooming and hygiene small to medium size frame ? ?No swelling or varicose veins noted minimal peripheral edema good pulses normal temperature in the legs ? ?She walks with a varus thrust she has a normal left knee ? ?The right knee shows tenderness in varus with tenderness medially she has 108 degrees of flexion the knee is stable in all planes muscle strength and tone is normal the skin envelope looks good ? ?Sensation is normal she has no reflex issues she is alert and oriented x3 there is no depression anxiety or agitation ? 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 ? ?There were no vitals taken for this visit. ?Wt Readings from Last 3 Encounters:  ?11/05/21 160 lb (72.6 kg)  ?11/02/21 160 lb (72.6 kg)  ?06/20/21 161 lb (73 kg)  ? ? ? ?Health Maintenance Due  ?Topic Date Due  ? OPHTHALMOLOGY EXAM  Never done  ? URINE MICROALBUMIN  Never done  ? COVID-19 Vaccine (3 - Booster for Moderna series) 07/18/2020  ? MAMMOGRAM  12/23/2020  ? FOOT EXAM  10/20/2021  ? ? ?There are no preventive care reminders to display for this patient. ? ?No results found for: TSH ?Lab Results  ?Component Value Date  ? WBC 5.1 11/02/2021  ? HGB 15.3 11/02/2021  ? HCT 44.2 11/02/2021  ? MCV 96 11/02/2021  ? PLT 142 (L) 11/02/2021  ? ?Lab Results  ?Component Value Date  ? NA 145 (H) 11/02/2021  ? K 3.7 11/02/2021  ? CO2 28 11/02/2021  ? GLUCOSE 182 (H) 11/02/2021  ? BUN 17 11/02/2021  ? CREATININE 1.01 (H) 11/02/2021  ?  BILITOT 0.4 11/02/2021  ? ALKPHOS 88 11/02/2021  ? AST 52 (H) 11/02/2021  ? ALT 32 11/02/2021  ? PROT 6.3 11/02/2021  ? ALBUMIN 3.7 11/02/2021  ? CALCIUM 9.7 11/02/2021  ? ANIONGAP 10 04/13/2021  ? EGFR 55 (L) 11/02/2021  ? ?Lab Results  ?Component Value Date  ? CHOL 136 11/02/2021  ? ?Lab Results  ?Component Value Date  ? HDL 53 11/02/2021  ? ?Lab Results  ?Component Value Date  ? [place]  61 11/02/2021  ? ?Lab Results  ?Component Value Date  ? TRIG 128 11/02/2021  ? ?Lab Results  ?Component Value Date  ? CHOLHDL 2.6 11/02/2021  ? ?Lab Results  ?Component Value Date  ? HGBA1C 6.7 (H) 11/02/2021  ? ? ?  ?Assessment & Plan:  ? ?Problem List Items Addressed This Visit   ?None ?Visit Diagnoses   ? ? Chronic pain of right knee    -  Primary  ? Relevant Orders  ? DG Knee AP/LAT W/[place] Right (Completed)  ? ?  ? ? ?No orders of the defined types were placed in this encounter. ? ?Encounter Diagnosis  ?Name Primary?  ? Chronic pain of right knee Yes  ? ?Plan right total knee arthroplasty April 18 ? ?Stop Eliquis April 15 ? ? ? ?Follow-up: No follow-ups on file.  ? ? ?Arther Abbott, MD ?

## 2021-12-03 ENCOUNTER — Ambulatory Visit (INDEPENDENT_AMBULATORY_CARE_PROVIDER_SITE_OTHER): Payer: Medicare HMO | Admitting: Licensed Clinical Social Worker

## 2021-12-03 DIAGNOSIS — M170 Bilateral primary osteoarthritis of knee: Secondary | ICD-10-CM

## 2021-12-03 DIAGNOSIS — I10 Essential (primary) hypertension: Secondary | ICD-10-CM

## 2021-12-03 DIAGNOSIS — K219 Gastro-esophageal reflux disease without esophagitis: Secondary | ICD-10-CM

## 2021-12-03 DIAGNOSIS — E119 Type 2 diabetes mellitus without complications: Secondary | ICD-10-CM

## 2021-12-03 DIAGNOSIS — E876 Hypokalemia: Secondary | ICD-10-CM

## 2021-12-03 DIAGNOSIS — F5101 Primary insomnia: Secondary | ICD-10-CM

## 2021-12-03 NOTE — Patient Instructions (Addendum)
Visit Information ? ?Patient Goals:  Protect My Health (Patient). Manage health  needs of client and manage daily activities as needed ? ?Timeframe:  Short-Term Goal ?Priority:  Medium ?Progress: On Track ?Start Date:       12/03/21                       ?Expected End Date:       02/28/22               ? ?Follow Up Date 01/28/22 at 3:00 PM ?  ?Protect My Health (Patient) Manage health needs of client and manage daily activities as needed  ?  ?Why is this important?   ?Screening tests can find diseases early when they are easier to treat.  ?Your doctor or nurse will talk with you about which tests are important for you.  ?Getting shots for common diseases like the flu and shingles will help prevent them.    ? ?Patient Strengths: ? ?Takes medications as prescribed ?Attends scheduled medical appointments ?Has support from her two sons ?Communicates well with others ? ?Patient Deficits: ? ?Challenges in managing medical needs ?Pain issues ?Mobility issues ? ?Patient Goals:  ? ?Attend scheduled medical appointments ?Take medications as prescribed ?Call RNCM or LCSW as needed for support ?Call medical provider as needed for information about managing client medical issues faced ?-  ?Follow Up Plan: LCSW to call client or son of client on  01/28/22 at 3:00 PM to assess client needs  ? ?Kelton Pillar.Zaivion Kundrat MSW, LCSW ?Licensed Clinical Social Worker ?[facility] ?[phone] ?

## 2021-12-03 NOTE — Chronic Care Management (AMB) (Signed)
?Chronic Care Management  ? ? Clinical Social Work Note ? ?12/03/2021 ?Name: Norma Barton MRN: [id number] DOB: [birth year] ? ?Norma Barton is a 87 y.o. year old female who is a primary care patient of [name], FNP. The CCM team was consulted to assist the patient with chronic disease management and/or care coordination needs related to: [name] .  ? ?Engaged with patient by telephone for follow up visit in response to provider referral for social work chronic care management and care coordination services.  ? ?Consent to Services:  ?The patient was given information about Chronic Care Management services, agreed to services, and gave verbal consent prior to initiation of services.  Please see initial visit note for detailed documentation.  ? ?Patient agreed to services and consent obtained.  ? ?Assessment: Review of patient past medical history, allergies, medications, and health status, including review of relevant consultants reports was performed today as part of a comprehensive evaluation and provision of chronic care management and care coordination services.    ? ?SDOH (Social Determinants of Health) assessments and interventions performed:  ?SDOH Interventions   ? ?Flowsheet Row Most Recent Value  ?SDOH Interventions   ?Physical Activity Interventions Other (Comments)  [walking challenges. uses a walker to help her walk]  ?Stress Interventions Provide Counseling  [client has stress related to managing medical needs]  ? ?  ?  ? ?Advanced Directives Status: See Vyn[employer] application for related entries. ? ?CCM Care Plan ? ?No Known Allergies ? ?Outpatient Encounter Medications as of 12/03/2021  ?Medication Sig  ? apixaban (ELIQUIS) 5 MG TABS tablet Take 1 tablet (5 mg total) by mouth 2 (two) times daily.  ? cholecalciferol (VITAMIN D) 25 MCG (1000 UNIT) tablet Take 2,000 Units by mouth in the morning.  ? furosemide (LASIX) 20 MG tablet TAKE 1 TABLET BY MOUTH EVERY DAY  ? metoprolol  succinate (TOPROL-XL) 25 MG 24 hr tablet Take 1 tablet (25 mg total) by mouth daily.  ? naproxen sodium (ALEVE) 220 MG tablet Take 220 mg by mouth daily as needed (knee pain.).  ? omeprazole (PRILOSEC) 40 MG capsule Take 1 capsule (40 mg total) by mouth daily.  ? potassium chloride SA (KLOR-CON M) 20 MEQ tablet Take 1 tablet (20 mEq total) by mouth daily.  ? triamcinolone cream (KENALOG) 0.1 % Apply 1 application topically 2 (two) times daily.  ? ?No facility-administered encounter medications on file as of 12/03/2021.  ? ? ?Patient Active Problem List  ? Diagnosis Date Noted  ? Paroxysmal atrial fibrillation (HCC) 11/02/2021  ? Cough 05/15/2021  ? Diabetes mellitus without complication (HCC) 10/19/2019  ? BMI 30.0-30.9,adult 01/12/2018  ? Gastroesophageal reflux disease without esophagitis 01/12/2018  ? Hypokalemia 01/12/2018  ? Hypertension 04/12/2013  ? Osteoarthritis of both knees 04/12/2013  ? Insomnia 11/22/2012  ? Cataract 08/04/2012  ? ? ?Conditions to be addressed/monitored: monitor client management of medical needs ? ?Care Plan : LCSW Care plan  ?Updates made by [name], LCSW since 12/03/2021 12:00 AM  ?  ? ?Problem: Coping Skills (General Plan of Care)   ?  ? ?Goal: Manage medical needs of client and manage daily activities of client   ?Start Date: 12/03/2021  ?Expected End Date: 02/28/2022  ?This Visit's Progress: On track  ?Recent Progress: On track  ?Priority: Medium  ?Note:   ?Current barriers:   ?Patient in need of assistance with connecting to community resources for assistance in managing ongoing client medical conditions ?Patient is unable to independently navigate  community resource options without care coordination support ?Mobility issues ?Pain issues ? ?Clinical Goals:  ?patient will work with SW in next 30 days  to address concerns related to client management of health needs faced and related to client completion of daily ADLs ?Client to call RNCM or LCSW  as needed for support in  next 30 days ?Client to talk with LCSW in next 30 days related to mobility issues of client and related to pain issues of client  ? ?Clinical Interventions:  ?Collaboration with [name], FNP regarding development and update of comprehensive plan of care as evidenced by provider attestation and co-signature ?Discussed client needs with Delila Pereyra.  ?Discussed with [place] sleeping issues of client . She said she takes some naps during the day and it makes it harder for her to sleep at night ?Encouraged client to call RNCM as needed for CCM nursing support for client ?Reviewed pain issues of client. She said she has pain in her right knee. She said she is scheduled for knee procedure on January 01, 2022.  She said she hopes to be able to go to a SNF facility for rehab after discharge from hospital. ?Discussed family support with her sons and with her sister. ?Discussed transport needs. She said that her sister helps transport her to and from most of her appointments. ?Reviewed gout issues of client. She said she has occasional gout issues in her feet. ?Discussed edema issues of client. She said she has edema issues.  ?Provided counseling support for client ? ?Patient Strengths: ? ?Takes medications as prescribed ?Attends scheduled medical appointments ?Has support from her two sons ?Communicates well with others ? ?Patient Deficits: ? ?Challenges in managing medical needs ?Pain issues ?Mobility issues ? ?Patient Goals:  ? ?Attend scheduled medical appointments ?Take medications as prescribed ?Call RNCM or LCSW as needed for support ?Call medical provider as needed for information about managing client medical issues faced ?-  ?Follow Up Plan: LCSW to call client  or son of client on 01/28/22 at 3:00 PM to assess client needs  ?  ?  ?Kelton Pillar.Yechezkel Fertig MSW, LCSW ?Licensed Clinical Social Worker ?[facility] ?[phone] ?

## 2021-12-07 ENCOUNTER — Telehealth: Payer: Self-pay | Admitting: Nurse Practitioner

## 2021-12-10 NOTE — Telephone Encounter (Signed)
Patient complains of diarrhea 2 days with some stomach pain today. No nausea or vomiting  ?

## 2021-12-10 NOTE — Telephone Encounter (Signed)
That is not a normal side effect of eliquis. How long have you been having diarrhea? ?

## 2021-12-10 NOTE — Telephone Encounter (Signed)
Patient states she cant remember but has not been on it that long ?

## 2021-12-10 NOTE — Telephone Encounter (Signed)
If it has been more then 2 months since starting and diarrhea just started then it is not coming form eliquis ?

## 2021-12-10 NOTE — Telephone Encounter (Signed)
How long has she been on eliquis ?

## 2021-12-12 ENCOUNTER — Other Ambulatory Visit: Payer: Self-pay

## 2021-12-12 DIAGNOSIS — M1711 Unilateral primary osteoarthritis, right knee: Secondary | ICD-10-CM

## 2021-12-12 DIAGNOSIS — G8929 Other chronic pain: Secondary | ICD-10-CM

## 2021-12-14 DIAGNOSIS — E119 Type 2 diabetes mellitus without complications: Secondary | ICD-10-CM

## 2021-12-14 DIAGNOSIS — M170 Bilateral primary osteoarthritis of knee: Secondary | ICD-10-CM

## 2021-12-14 DIAGNOSIS — I10 Essential (primary) hypertension: Secondary | ICD-10-CM

## 2021-12-26 NOTE — Patient Instructions (Signed)
? ? ? ? ? ? ? ? [facility] ? 12/26/2021  ?  ? @PREFPERIOPPHARMACY @ ? ? Your procedure is scheduled on  01/01/2022. ? ? Report to [facility] at  0600  A.M. ? ? Call this number if you have problems the morning of surgery: ? [phone] ? ? Remember: ? Do not eat or drink after midnight. ?  ?  ? Take these medicines the morning of surgery with A SIP OF WATER  ? ?metoprolol, prilosec. ?  ? Do not wear jewelry, make-up or nail polish. ? Do not wear lotions, powders, or perfumes, or deodorant. ? Do not shave 48 hours prior to surgery.  Men may shave face and neck. ? Do not bring valuables to the hospital. ? [facility] is not responsible for any belongings or valuables. ? ?Contacts, dentures or bridgework may not be worn into surgery.  Leave your suitcase in the car.  After surgery it may be brought to your room. ? ?For patients admitted to the hospital, discharge time will be determined by your treatment team. ? ?Patients discharged the day of surgery will not be allowed to drive home and must have someone with them for 24 hours.  ? ? ?Special instructions:   DO NOT smoke tobacco or vape for 24 hours before your procedure. ? ?Please read over the following fact sheets that you were given. ?Coughing and Deep Breathing, Blood Transfusion Information, Total Joint Packet, Surgical Site Infection Prevention, Anesthesia Post-op Instructions, and Care and Recovery After Surgery ?  ? ? ? Total Knee Replacement, Care After ?This sheet gives you information about how to care for yourself after your procedure. Your health care provider may also give you more specific instructions. If you have problems or questions, contact your health care provider. ?What can I expect after the procedure? ?After the procedure, it is common to have: ?Redness, pain, and swelling at the incision area. ?Stiffness. ?Discomfort. ?A small amount of blood or clear fluid coming from your incision. ?Follow these instructions at home: ?Medicines ?Take  over-the-counter and prescription medicines only as told by your health care provider. ?If you were prescribed a blood thinner (anticoagulant), take it as told by your health care provider. ?Ask your health care provider if the medicine prescribed to you: ?Requires you to avoid driving or using machinery. ?Can cause constipation. You may need to take these actions to prevent or treat constipation: ?Drink enough fluid to keep your urine pale yellow. ?Take over-the-counter or prescription medicines. ?Eat foods that are high in fiber, such as beans, whole grains, and fresh fruits and vegetables. ?Limit foods that are high in fat and processed sugars, such as fried or sweet foods. ?Incision care ? ?Follow instructions from your health care provider about how to take care of your incision. Make sure you: ?Wash your hands with soap and water for at least 20 seconds before and after you change your bandage (dressing). If soap and water are not available, use hand sanitizer. ?Change your dressing as told by your health care provider. ?Leave stitches (sutures), staples, skin glue, or adhesive strips in place. These skin closures may need to stay in place for 2 weeks or longer. If adhesive strip edges start to loosen and curl up, you may trim the loose edges. Do not remove adhesive strips completely unless your health care provider tells you to do that. ?Do not take baths, swim, or use a hot tub until your health care provider approves. ?Check your incision  area every day for signs of infection. Check for: ?More redness, swelling, or pain. ?More fluid or blood. ?Warmth. ?Pus or a bad smell. ?Activity ?Rest as told by your health care provider. ?Avoid sitting for a long time without moving. Get up to take short walks every 1-2 hours. This is important to improve blood flow and breathing. Ask for help if you feel weak or unsteady. ?Follow instructions from your health care provider about using a walker, crutches, or a  cane. ?You may use your legs to support (bear) your body weight as told by your health care provider. Follow instructions about how much weight you may safely support on your affected leg (weight-bearing restrictions). ?A physical therapist may show you how to get out of a bed and chair and how to go up and down stairs. You will first do this with a walker, crutches, or a cane and then without any of these devices. ?Once you are able to walk without a limp, you may stop using a walker, crutches, or a cane. ?Do exercises as told by your health care provider or physical therapist. ?Avoid high-impact activities, including running, jumping rope, and doing jumping jacks. ?Do not play contact sports until your health care provider approves. ?Return to your normal activities as told by your health care provider. Ask your health care provider what activities are safe for you. ?Managing pain, stiffness, and swelling ? ?If directed, put ice on your knee. To do this: ?Put ice in a plastic bag or use the icing device (cold flow pad) that you were given. Follow instructions from your health care provider about how to use the icing device. ?Place a towel between your skin and the bag or between your skin and the icing device. ?Leave the ice on for 20 minutes, 2-3 times a day. ?Remove the ice if your skin turns bright red. This is very important. If you cannot feel pain, heat, or cold, you have a greater risk of damage to the area. ?Move your toes often to reduce stiffness and swelling. ?Raise (elevate) your leg above the level of your heart while you are sitting or lying down. ?Use several pillows to keep your leg straight. ?Do not put a pillow just under the knee. If the knee is bent for a long time, this may lead to stiffness. ?Wear elastic knee support as told by your health care provider. ?Safety ? ?To help prevent falls, keep floors clear of objects you may trip over. Place items that you may need within easy reach. ?Wear an  apron or tool belt with pockets for carrying objects. This leaves your hands free to help with your balance. ?Ask your health care provider when it is safe to drive. ?General instructions ?Wear compression stockings as told by your health care provider. These stockings help to prevent blood clots and reduce swelling in your legs. ?Continue with breathing exercises. This helps prevent lung infection. ?Do not use any products that contain nicotine or tobacco. These products include cigarettes, chewing tobacco, and vaping devices, such as e-cigarettes. These can delay healing after surgery. If you need help quitting, ask your health care provider. ?Tell your health care provider if you plan to have dental work. Also: ?Tell your dentist about your joint replacement. ?Ask your health care provider if there are any special instructions you need to follow before having dental care and routine cleanings. ?Keep all follow-up visits. This is important. ?Contact a health care provider if: ?You have a fever or  chills. ?You have a cough or feel short of breath. ?Your medicine is not controlling your pain. ?You have any of these signs of infection: ?More redness, swelling, or pain around your incision. ?More fluid or blood coming from your incision. ?Warmth coming from your incision. ?Pus or a bad smell coming from your incision. ?You fall. ?Get help right away if: ?You have severe pain. ?You have trouble breathing. ?You have chest pain. ?You have redness, swelling, pain, or warmth in your calf or leg. ?Your incision breaks open after sutures or staples are removed. ?These symptoms may represent a serious problem that is an emergency. Do not wait to see if the symptoms will go away. Get medical help right away. Call your local emergency services (911 in the [place] Do not drive yourself to the hospital. ?Summary ?After the procedure, it is common to have pain and swelling at the incision area, a small amount of blood or fluid  coming from your incision, and stiffness. ?Follow instructions from your health care provider about how to take care of your incision. ?Use crutches, a walker, or a cane as told by your health care provider. ?Thi

## 2021-12-27 ENCOUNTER — Telehealth: Payer: Self-pay | Admitting: Radiology

## 2021-12-27 NOTE — Telephone Encounter (Signed)
Patient called, said that someone called about a machine, I am assuming the CPM.  She thought she was going to go to a facility for a while after surgery.  Please call her to discuss/clarify, thanks. ?

## 2021-12-28 ENCOUNTER — Encounter (HOSPITAL_COMMUNITY): Payer: Self-pay

## 2021-12-28 ENCOUNTER — Other Ambulatory Visit (HOSPITAL_COMMUNITY): Payer: Medicare HMO | Attending: Orthopedic Surgery

## 2021-12-28 ENCOUNTER — Encounter (HOSPITAL_COMMUNITY)
Admission: RE | Admit: 2021-12-28 | Discharge: 2021-12-28 | Disposition: A | Discharge status: Home / Self Care | Payer: Medicare HMO | Source: Ambulatory Visit | Attending: Orthopedic Surgery | Admitting: Orthopedic Surgery

## 2021-12-28 VITALS — BP 126/66 | HR 69 | Temp 98.4°F | Resp 18 | Ht 62.0 in | Wt 160.0 lb

## 2021-12-28 DIAGNOSIS — Z01818 Encounter for other preprocedural examination: Secondary | ICD-10-CM

## 2021-12-28 DIAGNOSIS — Z01812 Encounter for preprocedural laboratory examination: Secondary | ICD-10-CM | POA: Insufficient documentation

## 2021-12-28 DIAGNOSIS — Z7901 Long term (current) use of anticoagulants: Secondary | ICD-10-CM | POA: Diagnosis not present

## 2021-12-28 HISTORY — DX: Unspecified osteoarthritis, unspecified site: M19.90

## 2021-12-28 LAB — CBC WITH DIFFERENTIAL/PLATELET
Abs Immature Granulocytes: 0.01 10*3/uL (ref 0.00–0.07)
Basophils Absolute: 0 10*3/uL (ref 0.0–0.1)
Basophils Relative: 1 %
Eosinophils Absolute: 0.1 10*3/uL (ref 0.0–0.5)
Eosinophils Relative: 2 %
HCT: 45.7 % (ref 36.0–46.0)
Hemoglobin: 15 g/dL (ref 12.0–15.0)
Immature Granulocytes: 0 %
Lymphocytes Relative: 47 %
Lymphs Abs: 2.5 10*3/uL (ref 0.7–4.0)
MCH: 32.3 pg (ref 26.0–34.0)
MCHC: 32.8 g/dL (ref 30.0–36.0)
MCV: 98.5 fL (ref 80.0–100.0)
Monocytes Absolute: 0.6 10*3/uL (ref 0.1–1.0)
Monocytes Relative: 11 %
Neutro Abs: 2 10*3/uL (ref 1.7–7.7)
Neutrophils Relative %: 39 %
Platelets: 127 10*3/uL — ABNORMAL LOW (ref 150–400)
RBC: 4.64 MIL/uL (ref 3.87–5.11)
RDW: 12.6 % (ref 11.5–15.5)
WBC: 5.2 10*3/uL (ref 4.0–10.5)
nRBC: 0 % (ref 0.0–0.2)

## 2021-12-28 LAB — TYPE AND SCREEN
ABO/RH(D): O NEG
Antibody Screen: NEGATIVE

## 2021-12-28 LAB — BASIC METABOLIC PANEL
Anion gap: 7 (ref 5–15)
BUN: 17 mg/dL (ref 8–23)
CO2: 29 mmol/L (ref 22–32)
Calcium: 9.1 mg/dL (ref 8.9–10.3)
Chloride: 101 mmol/L (ref 98–111)
Creatinine, Ser: 0.86 mg/dL (ref 0.44–1.00)
GFR, Estimated: >60 mL/min (ref >60)
Glucose, Bld: 145 mg/dL — ABNORMAL HIGH (ref 70–99)
Potassium: 3.5 mmol/L (ref 3.5–5.1)
Sodium: 137 mmol/L (ref 135–145)

## 2021-12-28 LAB — PROTIME-INR
INR: 1.5 — ABNORMAL HIGH (ref 0.8–1.2)
Prothrombin Time: 17.7 seconds — ABNORMAL HIGH (ref 11.4–15.2)

## 2021-12-28 LAB — APTT: aPTT: 33 seconds (ref 24–36)

## 2021-12-28 LAB — PREPARE RBC (CROSSMATCH)

## 2021-12-28 NOTE — Telephone Encounter (Signed)
Spoke with patient. She has no one to help her after her surgery and she is wanting to go to OP Rehab after her surgery. She said she would speak to someone at the hospital about it today when she goes for her pre-op ?

## 2021-12-31 NOTE — H&P (Signed)
?[facility] ? ?12/31/2021 ? ? ? ?ASSESSMENT AND PLAN:    ? ?Osteoarthritis right knee ? ?Right total knee ? ?Chief complaint right knee pain ? ?History of present illness 87 year old female previously scheduled for right total knee canceled due to cardiac arrhythmia.  Patient underwent appropriate cardiology consult was cleared for surgery.  She has been off her Eliquis for the appropriate amount of time ? ?Complains of severe pain deformity and loss of motion right knee.  She has pain that interferes with her activities of daily living and has become severe for her. ? ?She presents now for right total knee arthroplasty ?HISTORY SECTION : ? ?No current facility-administered medications for this encounter. ? ?Current Outpatient Medications:  ?  acetaminophen (TYLENOL) 500 MG tablet, Take 1,000 mg by mouth every 6 (six) hours as needed for mild pain., Disp: , Rfl:  ?  cholecalciferol (VITAMIN D) 25 MCG (1000 UNIT) tablet, Take 2,000 Units by mouth in the morning., Disp: , Rfl:  ?  furosemide (LASIX) 20 MG tablet, TAKE 1 TABLET BY MOUTH EVERY DAY (Patient taking differently: Take 20 mg by mouth daily.), Disp: 90 tablet, Rfl: 1 ?  metoprolol succinate (TOPROL-XL) 25 MG 24 hr tablet, Take 1 tablet (25 mg total) by mouth daily., Disp: 90 tablet, Rfl: 1 ?  omeprazole (PRILOSEC) 40 MG capsule, Take 1 capsule (40 mg total) by mouth daily., Disp: 90 capsule, Rfl: 1 ?  potassium chloride SA (KLOR-CON M) 20 MEQ tablet, Take 1 tablet (20 mEq total) by mouth daily., Disp: 90 tablet, Rfl: 1 ?  triamcinolone cream (KENALOG) 0.1 %, Apply 1 application topically 2 (two) times daily., Disp: 45 g, Rfl: 2 ?  apixaban (ELIQUIS) 5 MG TABS tablet, Take 1 tablet (5 mg total) by mouth 2 (two) times daily. (Patient not taking: Reported on 12/24/2021), Disp: 60 tablet, Rfl: 6 ? ?No Known Allergies ? ? ?ROS negative, no shortness of breath or chest pain ? ? has a past medical history of Arthritis, Diabetes mellitus without complication  ([place]), GERD (gastroesophageal reflux disease), and Hypertension.  ? ?Past Surgical History:  ?Procedure Laterality Date  ? ABDOMINAL HYSTERECTOMY    ? arthroscopy right knee    ? TOTAL KNEE ARTHROPLASTY Left 08/15/2016  ? Procedure: TOTAL KNEE ARTHROPLASTY;  Surgeon: Carole Civil, MD;  Location: [facility];  Service: Orthopedics;  Laterality: Left;  ? ? ?Social History  ? ?Socioeconomic History  ? Marital status: Widowed  ?  Spouse name: Not on file  ? Number of children: 5  ? Years of education: 74  ? Highest education level: 12th grade  ?Occupational History  ? Occupation: retired  ?Tobacco Use  ? Smoking status: Never  ? Smokeless tobacco: Never  ?Substance and Sexual Activity  ? Alcohol use: No  ? Drug use: No  ? Sexual activity: Not Currently  ?Other Topics Concern  ? Not on file  ?Social History Narrative  ? Lives alone in apartment  ? Has 5 sons - they all live within 30 miles or so  ? Had one daughter who died right after birth (cord wrapped around neck at birth)  ? ?Social Determinants of Health  ? ?Financial Resource Strain: Low Risk   ? Difficulty of Paying Living Expenses: Not hard at all  ?Food Insecurity: No Food Insecurity  ? Worried About Charity fundraiser in the Last Year: Never true  ? Ran Out of Food in the Last Year: Never true  ?Transportation Needs: No Transportation Needs  ?  Lack of Transportation (Medical): No  ? Lack of Transportation (Non-Medical): No  ?Physical Activity: Inactive  ? Days of Exercise per Week: 0 days  ? Minutes of Exercise per Session: 0 min  ?Stress: Stress Concern Present  ? Feeling of Stress : To some extent  ?Social Connections: Moderately Isolated  ? Frequency of Communication with Friends and Family: More than three times a week  ? Frequency of Social Gatherings with Friends and Family: More than three times a week  ? Attends Religious Services: Never  ? Active Member of Clubs or Organizations: Yes  ? Attends Archivist Meetings: More than 4 times per  year  ? Marital Status: Widowed  ?Intimate Partner Violence: Not At Risk  ? Fear of Current or Ex-Partner: No  ? Emotionally Abused: No  ? Physically Abused: No  ? Sexually Abused: No  ? ? ? ?Family History  ?Problem Relation Age of Onset  ? Parkinson's disease Father   ? Breast cancer Sister   ? ? ? ? ?PHYSICAL EXAM SECTION: ?There were no vitals taken for this visit.  There is no height or weight on file to calculate BMI. ? ? ?General appearance: Well-developed well-nourished no gross deformities ? ?Eyes clear normal vision no evidence of conjunctivitis or jaundice, extraocular muscles intact ? ?ENT: ears hearing normal, nasal passages clear, throat clear  ? ?Neck is supple without palpable mass, full range of motion ? ? ?Cardiovascular normal pulse and perfusion in all 4 extremities normal color without edema ? ?Lymph nodes: No lymphadenopathy ? ?Neurologically deep tendon reflexes are equal and normal, no sensation loss or deficits no pathologic reflexes ? ? ?Skin no lacerations or ulcerations no nodularity no palpable masses, no erythema or nodularity ? ?Psychological: Awake alert and oriented x3 mood and affect normal ? ?Musculoskeletal: Previous left total knee doing well functioning well bending well straightening well good strength ? ?Right knee skin looks clean tenderness medial compartment varus deformity.  She ambulates with a limp.  Extensor mechanism intact.  No instability. ? ?Imaging: I personally read the images and my interpretation is varus deformity right knee less than 10 degrees medial compartment narrowing 3 compartment disease mild to moderate osteophytes ? ?1:25 PM ? ?Arther Abbott ? ?  ?

## 2022-01-01 ENCOUNTER — Encounter (HOSPITAL_COMMUNITY): Payer: Self-pay | Admitting: Orthopedic Surgery

## 2022-01-01 ENCOUNTER — Other Ambulatory Visit: Payer: Self-pay

## 2022-01-01 ENCOUNTER — Observation Stay (HOSPITAL_COMMUNITY)
Admission: RE | Admit: 2022-01-01 | Discharge: 2022-01-03 | Disposition: A | Discharge status: Skilled Nursing Facility | Payer: Medicare HMO | Attending: Orthopedic Surgery | Admitting: Orthopedic Surgery

## 2022-01-01 ENCOUNTER — Encounter (HOSPITAL_COMMUNITY)
Admission: RE | Disposition: A | Discharge status: Skilled Nursing Facility | Payer: Self-pay | Source: Home / Self Care | Attending: Orthopedic Surgery

## 2022-01-01 ENCOUNTER — Ambulatory Visit (HOSPITAL_COMMUNITY): Payer: Medicare HMO

## 2022-01-01 ENCOUNTER — Ambulatory Visit (HOSPITAL_BASED_OUTPATIENT_CLINIC_OR_DEPARTMENT_OTHER): Payer: Medicare HMO | Admitting: Certified Registered Nurse Anesthetist

## 2022-01-01 ENCOUNTER — Ambulatory Visit (HOSPITAL_COMMUNITY): Payer: Medicare HMO | Admitting: Certified Registered Nurse Anesthetist

## 2022-01-01 DIAGNOSIS — Z79899 Other long term (current) drug therapy: Secondary | ICD-10-CM | POA: Insufficient documentation

## 2022-01-01 DIAGNOSIS — Z96652 Presence of left artificial knee joint: Secondary | ICD-10-CM | POA: Insufficient documentation

## 2022-01-01 DIAGNOSIS — M1711 Unilateral primary osteoarthritis, right knee: Principal | ICD-10-CM | POA: Diagnosis present

## 2022-01-01 DIAGNOSIS — E119 Type 2 diabetes mellitus without complications: Secondary | ICD-10-CM | POA: Insufficient documentation

## 2022-01-01 DIAGNOSIS — I10 Essential (primary) hypertension: Secondary | ICD-10-CM | POA: Diagnosis not present

## 2022-01-01 DIAGNOSIS — Z96651 Presence of right artificial knee joint: Secondary | ICD-10-CM | POA: Diagnosis not present

## 2022-01-01 DIAGNOSIS — Z7901 Long term (current) use of anticoagulants: Secondary | ICD-10-CM | POA: Diagnosis not present

## 2022-01-01 DIAGNOSIS — Z471 Aftercare following joint replacement surgery: Secondary | ICD-10-CM | POA: Diagnosis not present

## 2022-01-01 HISTORY — PX: TOTAL KNEE ARTHROPLASTY: SHX125

## 2022-01-01 LAB — GLUCOSE, CAPILLARY: Glucose-Capillary: 146 mg/dL — ABNORMAL HIGH (ref 70–99)

## 2022-01-01 SURGERY — ARTHROPLASTY, KNEE, TOTAL
Anesthesia: Spinal | Site: Knee | Laterality: Right

## 2022-01-01 MED ORDER — MIDAZOLAM HCL 5 MG/5ML IJ SOLN
INTRAMUSCULAR | Status: DC | PRN
  Administered 2022-01-01: 1 mg via INTRAVENOUS

## 2022-01-01 MED ORDER — FUROSEMIDE 20 MG PO TABS
20.0000 mg | ORAL_TABLET | Freq: Every day | ORAL | Status: DC
  Administered 2022-01-01 – 2022-01-03 (×3): 20 mg via ORAL
  Filled 2022-01-01 (×3): qty 1

## 2022-01-01 MED ORDER — PHENYLEPHRINE HCL-NACL 20-0.9 MG/250ML-% IV SOLN
INTRAVENOUS | Status: DC | PRN
  Administered 2022-01-01: 50 ug/min via INTRAVENOUS

## 2022-01-01 MED ORDER — FENTANYL CITRATE (PF) 100 MCG/2ML IJ SOLN
INTRAMUSCULAR | Status: AC
Start: 2022-01-01 — End: ?
  Filled 2022-01-01: qty 2

## 2022-01-01 MED ORDER — CEFAZOLIN SODIUM-DEXTROSE 2-4 GM/100ML-% IV SOLN
INTRAVENOUS | Status: AC
  Administered 2022-01-01: 2 g via INTRAVENOUS
  Filled 2022-01-01: qty 100

## 2022-01-01 MED ORDER — ONDANSETRON HCL 4 MG/2ML IJ SOLN
4.0000 mg | Freq: Once | INTRAMUSCULAR | Status: DC | PRN
  Filled 2022-01-01: qty 2

## 2022-01-01 MED ORDER — ALUM & MAG HYDROXIDE-SIMETH 200-200-20 MG/5ML PO SUSP
30.0000 mL | ORAL | Status: DC | PRN
  Administered 2022-01-02: 30 mL via ORAL
  Filled 2022-01-01: qty 30

## 2022-01-01 MED ORDER — KETOROLAC TROMETHAMINE 15 MG/ML IJ SOLN
15.0000 mg | Freq: Once | INTRAMUSCULAR | Status: AC
  Administered 2022-01-01: 15 mg via INTRAVENOUS

## 2022-01-01 MED ORDER — FENTANYL CITRATE PF 50 MCG/ML IJ SOSY
25.0000 ug | PREFILLED_SYRINGE | INTRAMUSCULAR | Status: DC | PRN
  Administered 2022-01-01: 50 ug via INTRAVENOUS
  Filled 2022-01-01: qty 1

## 2022-01-01 MED ORDER — TRANEXAMIC ACID-NACL 1000-0.7 MG/100ML-% IV SOLN
1000.0000 mg | Freq: Once | INTRAVENOUS | Status: AC
  Administered 2022-01-01: 1000 mg via INTRAVENOUS
  Filled 2022-01-01: qty 100

## 2022-01-01 MED ORDER — METOPROLOL SUCCINATE ER 25 MG PO TB24
25.0000 mg | ORAL_TABLET | Freq: Every day | ORAL | Status: DC
  Administered 2022-01-02 – 2022-01-03 (×2): 25 mg via ORAL
  Filled 2022-01-01 (×2): qty 1

## 2022-01-01 MED ORDER — PHENYLEPHRINE 80 MCG/ML (10ML) SYRINGE FOR IV PUSH (FOR BLOOD PRESSURE SUPPORT)
PREFILLED_SYRINGE | INTRAVENOUS | Status: AC
  Filled 2022-01-01: qty 10

## 2022-01-01 MED ORDER — TRAMADOL HCL 50 MG PO TABS
50.0000 mg | ORAL_TABLET | Freq: Four times a day (QID) | ORAL | Status: DC
  Administered 2022-01-01 – 2022-01-03 (×8): 50 mg via ORAL
  Filled 2022-01-01 (×9): qty 1

## 2022-01-01 MED ORDER — ACETAMINOPHEN 500 MG PO TABS
500.0000 mg | ORAL_TABLET | Freq: Four times a day (QID) | ORAL | Status: AC
  Administered 2022-01-01 – 2022-01-02 (×3): 500 mg via ORAL
  Filled 2022-01-01 (×4): qty 1

## 2022-01-01 MED ORDER — POTASSIUM CHLORIDE CRYS ER 20 MEQ PO TBCR
20.0000 meq | EXTENDED_RELEASE_TABLET | Freq: Every day | ORAL | Status: DC
  Administered 2022-01-01 – 2022-01-03 (×3): 20 meq via ORAL
  Filled 2022-01-01 (×3): qty 1

## 2022-01-01 MED ORDER — APIXABAN 2.5 MG PO TABS
2.5000 mg | ORAL_TABLET | Freq: Two times a day (BID) | ORAL | Status: DC
  Administered 2022-01-02 – 2022-01-03 (×3): 2.5 mg via ORAL
  Filled 2022-01-01 (×3): qty 1

## 2022-01-01 MED ORDER — ACETAMINOPHEN 500 MG PO TABS
500.0000 mg | ORAL_TABLET | Freq: Once | ORAL | Status: AC
  Administered 2022-01-01: 500 mg via ORAL

## 2022-01-01 MED ORDER — CHLORHEXIDINE GLUCONATE 0.12 % MT SOLN: 15.0000 mL | Freq: Once | OROMUCOSAL | Status: DC

## 2022-01-01 MED ORDER — HYDROCODONE-ACETAMINOPHEN 5-325 MG PO TABS
ORAL_TABLET | ORAL | Status: AC
  Filled 2022-01-01: qty 1

## 2022-01-01 MED ORDER — 0.9 % SODIUM CHLORIDE (POUR BTL) OPTIME
TOPICAL | Status: DC | PRN
  Administered 2022-01-01: 1000 mL

## 2022-01-01 MED ORDER — DOCUSATE SODIUM 100 MG PO CAPS
100.0000 mg | ORAL_CAPSULE | Freq: Two times a day (BID) | ORAL | Status: DC
  Administered 2022-01-01 – 2022-01-03 (×5): 100 mg via ORAL
  Filled 2022-01-01 (×5): qty 1

## 2022-01-01 MED ORDER — SODIUM CHLORIDE 0.9 % IR SOLN
Status: DC | PRN
  Administered 2022-01-01: 3000 mL

## 2022-01-01 MED ORDER — PROPOFOL 10 MG/ML IV BOLUS
INTRAVENOUS | Status: DC | PRN
  Administered 2022-01-01: 50 mg via INTRAVENOUS

## 2022-01-01 MED ORDER — METOCLOPRAMIDE HCL 5 MG/ML IJ SOLN: 5.0000 mg | Freq: Three times a day (TID) | INTRAMUSCULAR | Status: DC | PRN

## 2022-01-01 MED ORDER — KETOROLAC TROMETHAMINE 30 MG/ML IJ SOLN: 15.0000 mg | Freq: Four times a day (QID) | INTRAMUSCULAR | Status: DC

## 2022-01-01 MED ORDER — VITAMIN D 25 MCG (1000 UNIT) PO TABS
2000.0000 [IU] | ORAL_TABLET | Freq: Every morning | ORAL | Status: DC
  Administered 2022-01-02 – 2022-01-03 (×2): 2000 [IU] via ORAL
  Filled 2022-01-01 (×2): qty 2

## 2022-01-01 MED ORDER — FENTANYL CITRATE (PF) 100 MCG/2ML IJ SOLN
INTRAMUSCULAR | Status: DC | PRN
  Administered 2022-01-01: 25 ug via INTRAVENOUS

## 2022-01-01 MED ORDER — MORPHINE SULFATE (PF) 2 MG/ML IV SOLN: 0.5000 mg | INTRAVENOUS | Status: DC | PRN

## 2022-01-01 MED ORDER — MIDAZOLAM HCL 2 MG/2ML IJ SOLN
INTRAMUSCULAR | Status: AC
  Filled 2022-01-01: qty 2

## 2022-01-01 MED ORDER — ACETAMINOPHEN 325 MG PO TABS: 325.0000 mg | ORAL_TABLET | Freq: Four times a day (QID) | ORAL | Status: DC | PRN

## 2022-01-01 MED ORDER — KETOROLAC TROMETHAMINE 15 MG/ML IJ SOLN
INTRAMUSCULAR | Status: AC
  Filled 2022-01-01: qty 1

## 2022-01-01 MED ORDER — LACTATED RINGERS IV SOLN: INTRAVENOUS | Status: DC | PRN

## 2022-01-01 MED ORDER — CEFAZOLIN SODIUM-DEXTROSE 2-4 GM/100ML-% IV SOLN
2.0000 g | INTRAVENOUS | Status: AC
  Administered 2022-01-01: 2 g via INTRAVENOUS

## 2022-01-01 MED ORDER — HYDROCODONE-ACETAMINOPHEN 5-325 MG PO TABS: 1.0000 | ORAL_TABLET | ORAL | Status: DC | PRN

## 2022-01-01 MED ORDER — LACTATED RINGERS IV SOLN: INTRAVENOUS | Status: DC

## 2022-01-01 MED ORDER — MENTHOL 3 MG MT LOZG: 1.0000 | LOZENGE | OROMUCOSAL | Status: DC | PRN

## 2022-01-01 MED ORDER — DEXAMETHASONE SODIUM PHOSPHATE 10 MG/ML IJ SOLN
10.0000 mg | Freq: Once | INTRAMUSCULAR | Status: AC
  Administered 2022-01-02: 10 mg via INTRAVENOUS
  Filled 2022-01-01: qty 1

## 2022-01-01 MED ORDER — CEFAZOLIN SODIUM-DEXTROSE 2-4 GM/100ML-% IV SOLN
2.0000 g | Freq: Four times a day (QID) | INTRAVENOUS | Status: AC
  Administered 2022-01-01: 2 g via INTRAVENOUS
  Filled 2022-01-01 (×2): qty 100

## 2022-01-01 MED ORDER — PANTOPRAZOLE SODIUM 40 MG PO TBEC
40.0000 mg | DELAYED_RELEASE_TABLET | Freq: Every day | ORAL | Status: DC
  Administered 2022-01-02 – 2022-01-03 (×2): 40 mg via ORAL
  Filled 2022-01-01 (×2): qty 1

## 2022-01-01 MED ORDER — METHOCARBAMOL 1000 MG/10ML IJ SOLN: 500.0000 mg | Freq: Four times a day (QID) | INTRAVENOUS | Status: DC | PRN

## 2022-01-01 MED ORDER — PANTOPRAZOLE SODIUM 40 MG PO TBEC: 80.0000 mg | DELAYED_RELEASE_TABLET | Freq: Every day | ORAL | Status: DC

## 2022-01-01 MED ORDER — ROPIVACAINE HCL 5 MG/ML IJ SOLN
INTRAMUSCULAR | Status: AC
Start: 2022-01-01 — End: ?
  Filled 2022-01-01: qty 30

## 2022-01-01 MED ORDER — HYDROCODONE-ACETAMINOPHEN 5-325 MG PO TABS
1.0000 | ORAL_TABLET | Freq: Once | ORAL | Status: AC
  Administered 2022-01-01: 1 via ORAL

## 2022-01-01 MED ORDER — BUPIVACAINE IN DEXTROSE 0.75-8.25 % IT SOLN
INTRATHECAL | Status: DC | PRN
  Administered 2022-01-01: 1.6 mL via INTRATHECAL

## 2022-01-01 MED ORDER — CELECOXIB 100 MG PO CAPS
200.0000 mg | ORAL_CAPSULE | Freq: Two times a day (BID) | ORAL | Status: DC
  Administered 2022-01-01 – 2022-01-03 (×5): 200 mg via ORAL
  Filled 2022-01-01 (×5): qty 2

## 2022-01-01 MED ORDER — METHOCARBAMOL 500 MG PO TABS
500.0000 mg | ORAL_TABLET | Freq: Four times a day (QID) | ORAL | Status: DC | PRN
  Filled 2022-01-01: qty 1

## 2022-01-01 MED ORDER — METOCLOPRAMIDE HCL 5 MG PO TABS
5.0000 mg | ORAL_TABLET | Freq: Three times a day (TID) | ORAL | Status: DC | PRN
  Filled 2022-01-01: qty 2

## 2022-01-01 MED ORDER — ONDANSETRON HCL 4 MG/2ML IJ SOLN: 4.0000 mg | Freq: Four times a day (QID) | INTRAMUSCULAR | Status: DC | PRN

## 2022-01-01 MED ORDER — ONDANSETRON HCL 4 MG PO TABS: 4.0000 mg | ORAL_TABLET | Freq: Four times a day (QID) | ORAL | Status: DC | PRN

## 2022-01-01 MED ORDER — EPHEDRINE 5 MG/ML INJ
INTRAVENOUS | Status: AC
  Filled 2022-01-01: qty 5

## 2022-01-01 MED ORDER — POLYETHYLENE GLYCOL 3350 17 G PO PACK: 17.0000 g | PACK | Freq: Every day | ORAL | Status: DC | PRN

## 2022-01-01 MED ORDER — ORAL CARE MOUTH RINSE: 15.0000 mL | Freq: Once | OROMUCOSAL | Status: DC

## 2022-01-01 MED ORDER — ONDANSETRON HCL 4 MG/2ML IJ SOLN
INTRAMUSCULAR | Status: DC | PRN
  Administered 2022-01-01: 4 mg via INTRAVENOUS

## 2022-01-01 MED ORDER — PROPOFOL 500 MG/50ML IV EMUL
INTRAVENOUS | Status: DC | PRN
  Administered 2022-01-01: 50 ug/kg/min via INTRAVENOUS

## 2022-01-01 MED ORDER — SODIUM CHLORIDE 0.9 % IV SOLN: INTRAVENOUS | Status: DC

## 2022-01-01 MED ORDER — PHENYLEPHRINE HCL-NACL 20-0.9 MG/250ML-% IV SOLN
INTRAVENOUS | Status: AC
  Filled 2022-01-01: qty 250

## 2022-01-01 MED ORDER — TRANEXAMIC ACID-NACL 1000-0.7 MG/100ML-% IV SOLN
1000.0000 mg | INTRAVENOUS | Status: AC
  Administered 2022-01-01: 1000 mg via INTRAVENOUS
  Filled 2022-01-01: qty 100

## 2022-01-01 MED ORDER — PHENOL 1.4 % MT LIQD: 1.0000 | OROMUCOSAL | Status: DC | PRN

## 2022-01-01 MED ORDER — BUPIVACAINE-MELOXICAM ER 200-6 MG/7ML IJ SOLN
INTRAMUSCULAR | Status: AC
  Filled 2022-01-01: qty 2

## 2022-01-01 MED ORDER — ACETAMINOPHEN 500 MG PO TABS
ORAL_TABLET | ORAL | Status: AC
  Filled 2022-01-01: qty 1

## 2022-01-01 MED ORDER — HYDROCODONE-ACETAMINOPHEN 7.5-325 MG PO TABS: 1.0000 | ORAL_TABLET | ORAL | Status: DC | PRN

## 2022-01-01 MED ORDER — BUPIVACAINE-MELOXICAM ER 200-6 MG/7ML IJ SOLN
INTRAMUSCULAR | Status: DC | PRN
Start: 2022-01-01 — End: 2022-01-01
  Administered 2022-01-01: 400 mg

## 2022-01-01 SURGICAL SUPPLY — 63 items
ATTUNE PS FEM RT SZ 4 CEM KNEE (Femur) ×1 IMPLANT
BANDAGE ESMARK 6X9 LF (GAUZE/BANDAGES/DRESSINGS) ×1 IMPLANT
BASEPLATE TIB CMT FB PCKT SZ3 (Knees) ×1 IMPLANT
BLADE HEX COATED 2.75 (ELECTRODE) ×2 IMPLANT
BLADE SAGITTAL 25.0X1.27X90 (BLADE) ×2 IMPLANT
BLADE SAW SGTL 11.0X1.19X90.0M (BLADE) ×2 IMPLANT
BLADE SURG SZ10 CARB STEEL (BLADE) ×2 IMPLANT
BNDG ELASTIC 4X5.8 VLCR NS LF (GAUZE/BANDAGES/DRESSINGS) ×4 IMPLANT
BNDG ELASTIC 6X5.8 VLCR NS LF (GAUZE/BANDAGES/DRESSINGS) ×2 IMPLANT
BNDG ESMARK 6X9 LF (GAUZE/BANDAGES/DRESSINGS) ×2
CEMENT HV SMART SET (Cement) ×4 IMPLANT
CLOTH BEACON ORANGE TIMEOUT ST (SAFETY) ×2 IMPLANT
COOLER ICEMAN CLASSIC (MISCELLANEOUS) ×2 IMPLANT
COVER LIGHT HANDLE STERIS (MISCELLANEOUS) ×4 IMPLANT
CUFF TOURN SGL QUICK 34 (TOURNIQUET CUFF) ×2
CUFF TRNQT CYL 34X4.125X (TOURNIQUET CUFF) ×1 IMPLANT
DRAPE BACK TABLE (DRAPES) ×2 IMPLANT
DRAPE EXTREMITY T 121X128X90 (DISPOSABLE) ×2 IMPLANT
DRESSING AQUACEL AG ADV 3.5X12 (MISCELLANEOUS) ×1 IMPLANT
DRSG AQUACEL AG ADV 3.5X12 (MISCELLANEOUS) ×2
DURAPREP 26ML APPLICATOR (WOUND CARE) ×4 IMPLANT
ELECT REM PT RETURN 9FT ADLT (ELECTROSURGICAL) ×2
ELECTRODE REM PT RTRN 9FT ADLT (ELECTROSURGICAL) ×1 IMPLANT
GLOVE BIO SURGEON STRL SZ 6.5 (GLOVE) ×1 IMPLANT
GLOVE BIO SURGEON STRL SZ7 (GLOVE) ×2 IMPLANT
GLOVE BIOGEL PI IND STRL 7.0 (GLOVE) ×2 IMPLANT
GLOVE BIOGEL PI IND STRL 8.5 (GLOVE) IMPLANT
GLOVE BIOGEL PI INDICATOR 7.0 (GLOVE) ×4
GLOVE BIOGEL PI INDICATOR 8.5 (GLOVE) ×2
GLOVE SURG SS PI 8.0 STRL IVOR (GLOVE) ×1 IMPLANT
GOWN STRL REUS W/TWL LRG LVL3 (GOWN DISPOSABLE) ×6 IMPLANT
GOWN STRL REUS W/TWL XL LVL3 (GOWN DISPOSABLE) ×2 IMPLANT
HANDPIECE INTERPULSE COAX TIP (DISPOSABLE) ×2
HOOD W/PEELAWAY (MISCELLANEOUS) ×9 IMPLANT
INSERT TIB ATTUNE FB SZ4X8 (Insert) ×1 IMPLANT
INST SET MAJOR BONE (KITS) ×2 IMPLANT
IV NS IRRIG 3000ML ARTHROMATIC (IV SOLUTION) ×2 IMPLANT
KIT BLADEGUARD II DBL (SET/KITS/TRAYS/PACK) ×2 IMPLANT
KIT TURNOVER KIT A (KITS) ×2 IMPLANT
MANIFOLD NEPTUNE II (INSTRUMENTS) ×2 IMPLANT
MARKER SKIN DUAL TIP RULER LAB (MISCELLANEOUS) ×2 IMPLANT
NS IRRIG 1000ML POUR BTL (IV SOLUTION) ×2 IMPLANT
PACK TOTAL JOINT (CUSTOM PROCEDURE TRAY) ×2 IMPLANT
PAD ARMBOARD 7.5X6 YLW CONV (MISCELLANEOUS) ×2 IMPLANT
PAD COLD SHLDR WRAP-ON (PAD) ×2 IMPLANT
PATELLA MEDIAL ATTUN 35MM KNEE (Knees) ×1 IMPLANT
PILLOW KNEE EXTENSION 0 DEG (MISCELLANEOUS) ×2 IMPLANT
PIN/DRILL PACK ORTHO 1/8X3.0 (PIN) ×2 IMPLANT
SAW OSC TIP CART 19.5X105X1.3 (SAW) ×2 IMPLANT
SET BASIN LINEN APH (SET/KITS/TRAYS/PACK) ×2 IMPLANT
SET HNDPC FAN SPRY TIP SCT (DISPOSABLE) ×1 IMPLANT
SPONGE T-LAP 18X18 ~~LOC~~+RFID (SPONGE) ×5 IMPLANT
STAPLER VISISTAT 35W (STAPLE) ×2 IMPLANT
SUT BRALON NAB BRD #1 30IN (SUTURE) ×3 IMPLANT
SUT MNCRL 0 VIOLET CTX 36 (SUTURE) ×1 IMPLANT
SUT MON AB 0 CT1 (SUTURE) ×2 IMPLANT
SUT MONOCRYL 0 CTX 36 (SUTURE) ×2
SYR BULB IRRIG 60ML STRL (SYRINGE) ×2 IMPLANT
TOWEL OR 17X26 4PK STRL BLUE (TOWEL DISPOSABLE) ×2 IMPLANT
TOWER CARTRIDGE SMART MIX (DISPOSABLE) ×2 IMPLANT
TRAY FOLEY MTR SLVR 16FR STAT (SET/KITS/TRAYS/PACK) ×2 IMPLANT
WATER STERILE IRR 1000ML POUR (IV SOLUTION) ×4 IMPLANT
YANKAUER SUCT 12FT TUBE ARGYLE (SUCTIONS) ×2 IMPLANT

## 2022-01-01 NOTE — Anesthesia Procedure Notes (Signed)
Spinal ? ?Patient location during procedure: OR ?Start time: 01/01/2022 11:00 AM ?End time: 01/01/2022 11:05 AM ?Reason for block: surgical anesthesia ?Staffing ?Performed: resident/CRNA  ?Anesthesiologist: Windell Norfolk, MD ?Resident/CRNA: Darryl Nestle, CRNA ?Preanesthetic Checklist ?Completed: patient identified, IV checked, site marked, risks and benefits discussed, surgical consent, monitors and equipment checked, pre-op evaluation and timeout performed ?Spinal Block ?Patient position: sitting ?Prep: DuraPrep ?Patient monitoring: heart rate, cardiac monitor, continuous pulse ox and blood pressure ?Approach: midline ?Location: L3-4 ?Injection technique: single-shot ?Needle ?Needle type: Sprotte  ?Needle gauge: 24 G ?Needle length: 9 cm ?Assessment ?Sensory level: T4 ?Events: CSF return ? ? ? ?

## 2022-01-01 NOTE — Transfer of Care (Signed)
Immediate Anesthesia Transfer of Care Note ? ?Patient: Norma [facility] ? ?Procedure(s) Performed: TOTAL KNEE ARTHROPLASTY (Right: Knee) ? ?Patient Location: PACU ? ?Anesthesia Type:Spinal ? ?Level of Consciousness: awake ? ?Airway & Oxygen Therapy: Patient Spontanous Breathing and Patient connected to face mask oxygen ? ?Post-op Assessment: Report given to RN and Post -op Vital signs reviewed and stable ? ?Post vital signs: Reviewed and stable ? ?Last Vitals:  ?Vitals Value Taken Time  ?BP 112/57 01/01/22 1315  ?Temp    ?Pulse 68 01/01/22 1316  ?Resp 24 01/01/22 1316  ?SpO2 100 % 01/01/22 1316  ?Vitals shown include unvalidated device data. ? ?Last Pain:  ?Vitals:  ? 01/01/22 0810  ?PainSc: 0-No pain  ?   ? ?  ? ?Complications: No notable events documented. ?

## 2022-01-01 NOTE — Plan of Care (Signed)
?  Problem: Acute Rehab PT Goals(only PT should resolve) ?Goal: Pt Will Go Supine/Side To Sit ?Outcome: Progressing ?Flowsheets (Taken 01/01/2022 1612) ?Pt will go Supine/Side to Sit: ? with supervision ? with modified independence ?Goal: Patient Will Transfer Sit To/From Stand ?Outcome: Progressing ?Flowsheets (Taken 01/01/2022 1612) ?Patient will transfer sit to/from stand: ? with supervision ? with modified independence ?Goal: Pt Will Transfer Bed To Chair/Chair To Bed ?Outcome: Progressing ?Flowsheets (Taken 01/01/2022 1612) ?Pt will Transfer Bed to Chair/Chair to Bed: ? with supervision ? min guard assist ?Goal: Pt Will Ambulate ?Outcome: Progressing ?Flowsheets (Taken 01/01/2022 1612) ?Pt will Ambulate: ? 100 feet ? with supervision ? with min guard assist ? with rolling walker ?  ?4:12 PM, 01/01/22 ?Lonell Grandchild, MPT ?Physical Therapist with [facility] ?[facility] ?[phone] office ?[phone] mobile phone ? ?

## 2022-01-01 NOTE — Interval H&P Note (Signed)
History and Physical Interval Note: ? ?01/01/2022 ?10:49 AM ? ?Norma Barton  has presented today for surgery, with the diagnosis of Osteoarthritis right knee.  The various methods of treatment have been discussed with the patient and family. After consideration of risks, benefits and other options for treatment, the patient has consented to  Procedure(s) with comments: ?TOTAL KNEE ARTHROPLASTY (Right) - Preoperative block or postoperative block if Dr. Carolin Guernsey is anesthesia that day, pt knows to arrive at 8:00 as a surgical intervention.  The patient's history has been reviewed, patient examined, no change in status, stable for surgery.  I have reviewed the patient's chart and labs.  Questions were answered to the patient's satisfaction.   ? ? ?[name] ? ? ?

## 2022-01-01 NOTE — Anesthesia Postprocedure Evaluation (Signed)
Anesthesia Post Note ? ?Patient: Norma Barton ? ?Procedure(s) Performed: TOTAL KNEE ARTHROPLASTY (Right: Knee) ? ?Patient location during evaluation: Phase II ?Anesthesia Type: Spinal ?Level of consciousness: awake ?Pain management: pain level controlled ?Vital Signs Assessment: post-procedure vital signs reviewed and stable ?Respiratory status: spontaneous breathing and respiratory function stable ?Cardiovascular status: blood pressure returned to baseline and stable ?Postop Assessment: no headache and no apparent nausea or vomiting ?Anesthetic complications: no ?Comments: Late entry ? ? ?No notable events documented. ? ? ?Last Vitals:  ?Vitals:  ? 01/01/22 1345 01/01/22 1525  ?BP: (!) 108/52 113/60  ?Pulse: (!) 55 (!) 50  ?Resp: 18 20  ?Temp:    ?SpO2: 97% 100%  ?  ?Last Pain:  ?Vitals:  ? 01/01/22 1345  ?PainSc: Asleep  ? ? ?  ?  ?  ?  ?  ?  ? ?Windell Norfolk ? ? ? ? ?

## 2022-01-01 NOTE — TOC Initial Note (Signed)
Transition of Care (TOC) - Initial/Assessment Note  ? ? ?Patient Details  ?Name: Norma Barton ?MRN: [id number] ?Date of Birth: June 26, 1935 ? ?Transition of Care (TOC) CM/SW Contact:    ?Iona Beard, LCSWA ?Phone Number: ?01/01/2022, 4:05 PM ? ?Clinical Narrative:                 ?TOC updated by PT that they are recommending SNF for pt at D/C. CSW spoke with pt who is agreeable to a SNF referral at this time. Pts first choice is [facility]. Referral sent to community SNF facilities. TOC to follow. ? ?Expected Discharge Plan: [place] ?Barriers to Discharge: Continued Medical Work up ? ? ?Patient Goals and CMS Choice ?Patient states their goals for this hospitalization and ongoing recovery are:: go to SNF ?CMS Medicare.gov Compare Post Acute Care list provided to:: Patient ?Choice offered to / list presented to : Patient ? ?Expected Discharge Plan and Services ?Expected Discharge Plan: [place] ?In-house Referral: Clinical Social Work ?  ?Post Acute Care Choice: [place] ?Living arrangements for the past 2 months: [place] ?                ?  ?  ?  ?  ?  ?  ?  ?  ?  ?  ? ?Prior Living Arrangements/Services ?Living arrangements for the past 2 months: [place] ?Lives with:: Self ?Patient language and need for interpreter reviewed:: Yes ?Do you feel safe going back to the place where you live?: Yes      ?Need for Family Participation in Patient Care: Yes (Comment) ?Care giver support system in place?: Yes (comment) ?  ?Criminal Activity/Legal Involvement Pertinent to Current Situation/Hospitalization: No - Comment as needed ? ?Activities of Daily Living ?Home Assistive Devices/Equipment: Eyeglasses, Kasandra Knudsen (specify quad or straight) ?ADL Screening (condition at time of admission) ?Patient's cognitive ability adequate to safely complete daily activities?: Yes ?Is the patient deaf or have difficulty hearing?: No ?Does the patient have difficulty seeing,  even when wearing glasses/contacts?: No ?Does the patient have difficulty concentrating, remembering, or making decisions?: No ?Patient able to express need for assistance with ADLs?: Yes ?Does the patient have difficulty dressing or bathing?: No ?Independently performs ADLs?: Yes (appropriate for developmental age) ?Does the patient have difficulty walking or climbing stairs?: Yes ?Weakness of Legs: Both ?Weakness of Arms/Hands: None ? ?Permission Sought/Granted ?  ?  ?   ?   ?   ?   ? ?Emotional Assessment ?Appearance:: Appears stated age ?Attitude/Demeanor/Rapport: Engaged ?Affect (typically observed): Accepting ?Orientation: : Oriented to Self, Oriented to Place, Oriented to  Time, Oriented to Situation ?Alcohol / Substance Use: Not Applicable ?Psych Involvement: No (comment) ? ?Admission diagnosis:  Osteoarthritis of right knee [M17.11] ?Patient Active Problem List  ? Diagnosis Date Noted  ? Osteoarthritis of right knee 01/01/2022  ? Paroxysmal atrial fibrillation ([place]) 11/02/2021  ? Cough 05/15/2021  ? Diabetes mellitus without complication ([place]) [id number]  ? BMI 30.0-30.9,adult 01/12/2018  ? Gastroesophageal reflux disease without esophagitis 01/12/2018  ? Hypokalemia 01/12/2018  ? Hypertension 04/12/2013  ? Osteoarthritis of both knees 04/12/2013  ? Insomnia 11/22/2012  ? Cataract 08/04/2012  ? ?PCP:  Chevis Pretty, FNP ?Pharmacy:   ?[id number] - [place], [place] [address] ?[place] [address] ?Phone: [phone] Fax: [phone] ? ? ? ? ?Social Determinants of Health (SDOH) Interventions ?  ? ?Readmission Risk Interventions ?   ? View :  No data to display.  ?  ?  ?  ? ? ? ?

## 2022-01-01 NOTE — NC FL2 (Addendum)
?[place] MEDICAID FL2 LEVEL OF CARE SCREENING TOOL  ?  ? ?IDENTIFICATION  ?Patient Name: ?Norma Barton Birthdate: Dec 12, 1934 Sex: female Admission Date (Current Location): ?01/01/2022  ?[place] Number: ? [place] ?  Facility and Address:  ?[facility],  [address] [place] [address] ?     Provider Number: ?[id number]  ?Attending Physician Name and Address:  ?Vickki Hearing, MD ? Relative Name and Phone Number:  ?  ?   ?Current Level of Care: ?Hospital Recommended Level of Care: ?Skilled Nursing Facility Prior Approval Number: ?  ? ?Date Approved/Denied: ?  PASRR Number: ? [id number] ? ?Discharge Plan: ?SNF ?  ? ?Current Diagnoses: ?Patient Active Problem List  ? Diagnosis Date Noted  ? Osteoarthritis of right knee 01/01/2022  ? Paroxysmal atrial fibrillation ([facility]) 11/02/2021  ? Cough 05/15/2021  ? Diabetes mellitus without complication ([facility]) 10/19/2019  ? BMI 30.0-30.9,adult 01/12/2018  ? Gastroesophageal reflux disease without esophagitis 01/12/2018  ? Hypokalemia 01/12/2018  ? Hypertension 04/12/2013  ? Osteoarthritis of both knees 04/12/2013  ? Insomnia 11/22/2012  ? Cataract 08/04/2012  ? ? ?Orientation RESPIRATION BLADDER Height & Weight   ?  ?Self, Time, Situation, Place ? Normal Continent Weight: 158 lb 11.7 oz (72 kg) ?Height:  5\' 2"  (157.5 cm)  ?BEHAVIORAL SYMPTOMS/MOOD NEUROLOGICAL BOWEL NUTRITION STATUS  ?    Continent Diet (see dc summary)  ?AMBULATORY STATUS COMMUNICATION OF NEEDS Skin   ?Extensive Assist Verbally Normal, Other (Comment) (Closed right knee incision) ?  ?  ?  ?    ?     ?     ? ? ?Personal Care Assistance Level of Assistance  ?Bathing, Feeding, Dressing Bathing Assistance: Limited assistance ?Feeding assistance: Independent ?Dressing Assistance: Limited assistance ?   ? ?Functional Limitations Info  ?Sight, Hearing, Speech Sight Info: Adequate ?Hearing Info: Adequate ?Speech Info: Adequate  ? ? ?SPECIAL CARE FACTORS FREQUENCY  ?PT (By licensed  PT), OT (By licensed OT)   ?  ?PT Frequency: 5 times weekly ?OT Frequency: 5 times weekly ?  ?  ?  ?   ? ? ?Contractures Contractures Info: Not present  ? ? ?Additional Factors Info  ?Code Status, Allergies Code Status Info: FULL ?Allergies Info: NKA ?  ?  ?  ?   ? ?Current Medications (01/01/2022):  This is the current hospital active medication list ?Current Facility-Administered Medications  ?Medication Dose Route Frequency Provider Last Rate Last Admin  ? 0.9 %  sodium chloride infusion   Intravenous Continuous 01/03/2022, MD 100 mL/hr at 01/01/22 1534 New Bag at 01/01/22 1534  ? [START ON 01/02/2022] acetaminophen (TYLENOL) tablet 325-650 mg  325-650 mg Oral Q6H PRN 01/04/2022, MD      ? acetaminophen (TYLENOL) tablet 500 mg  500 mg Oral Q6H Vickki Hearing, MD      ? alum & mag hydroxide-simeth (MAALOX/MYLANTA) 200-200-20 MG/5ML suspension 30 mL  30 mL Oral Q4H PRN 03-25-2001, MD      ? Vickki Hearing ON 01/02/2022] apixaban (ELIQUIS) tablet 2.5 mg  2.5 mg Oral Q12H 01/04/2022, MD      ? ceFAZolin (ANCEF) IVPB 2g/100 mL premix  2 g Intravenous Q6H Vickki Hearing, MD      ? celecoxib (CELEBREX) capsule 200 mg  200 mg Oral BID Vickki Hearing, MD   200 mg at 01/01/22 1530  ? [START ON 01/02/2022] cholecalciferol (VITAMIN D3) tablet 2,000 Units  2,000 Units Oral q AM  Vickki Hearing, MD      ? Melene Muller ON 01/02/2022] dexamethasone (DECADRON) injection 10 mg  10 mg Intravenous Once Vickki Hearing, MD      ? docusate sodium (COLACE) capsule 100 mg  100 mg Oral BID Vickki Hearing, MD   100 mg at 01/01/22 1530  ? furosemide (LASIX) tablet 20 mg  20 mg Oral Daily Vickki Hearing, MD   20 mg at 01/01/22 1531  ? HYDROcodone-acetaminophen (NORCO) 7.5-325 MG per tablet 1-2 tablet  1-2 tablet Oral Q4H PRN Vickki Hearing, MD      ? HYDROcodone-acetaminophen (NORCO/VICODIN) 5-325 MG per tablet 1-2 tablet  1-2 tablet Oral Q4H PRN Vickki Hearing, MD      ?  menthol-cetylpyridinium (CEPACOL) lozenge 3 mg  1 lozenge Oral PRN Vickki Hearing, MD      ? Or  ? phenol (CHLORASEPTIC) mouth spray 1 spray  1 spray Mouth/Throat PRN Vickki Hearing, MD      ? methocarbamol (ROBAXIN) tablet 500 mg  500 mg Oral Q6H PRN Vickki Hearing, MD      ? Or  ? methocarbamol (ROBAXIN) 500 mg in dextrose 5 % 50 mL IVPB  500 mg Intravenous Q6H PRN Vickki Hearing, MD      ? metoCLOPramide (REGLAN) tablet 5-10 mg  5-10 mg Oral Q8H PRN Vickki Hearing, MD      ? Or  ? metoCLOPramide (REGLAN) injection 5-10 mg  5-10 mg Intravenous Q8H PRN Vickki Hearing, MD      ? Melene Muller ON 01/02/2022] metoprolol succinate (TOPROL-XL) 24 hr tablet 25 mg  25 mg Oral Daily Vickki Hearing, MD      ? morphine (PF) 2 MG/ML injection 0.5-1 mg  0.5-1 mg Intravenous Q2H PRN Vickki Hearing, MD      ? ondansetron [facility]) tablet 4 mg  4 mg Oral Q6H PRN Vickki Hearing, MD      ? Or  ? ondansetron [facility]) injection 4 mg  4 mg Intravenous Q6H PRN Vickki Hearing, MD      ? Melene Muller ON 01/02/2022] pantoprazole (PROTONIX) EC tablet 40 mg  40 mg Oral Daily Vickki Hearing, MD      ? polyethylene glycol (MIRALAX / GLYCOLAX) packet 17 g  17 g Oral Daily PRN Vickki Hearing, MD      ? potassium chloride SA (KLOR-CON M) CR tablet 20 mEq  20 mEq Oral Daily Vickki Hearing, MD   20 mEq at 01/01/22 1531  ? traMADol (ULTRAM) tablet 50 mg  50 mg Oral Q6H Vickki Hearing, MD   50 mg at 01/01/22 1530  ? ? ? ?Discharge Medications: ?Please see discharge summary for a list of discharge medications. ? ?Relevant Imaging Results: ? ?Relevant Lab Results: ? ? ?Additional Information ?SSN: [id number] ? ?Villa Herb, LCSWA ? ? ? ? ?

## 2022-01-01 NOTE — Evaluation (Signed)
Physical Therapy Evaluation ?Patient Details ?Name: Norma Barton ?MRN: [id number] ?DOB: 07/22/1935 ?Today's Date: 01/01/2022 ? ? ?RIGHT KNEE ROM:  84 degrees ?AMBULATION DISTANCE: 45 feet using RW with Min assist ? ? ? ?History of Present Illness ? Norma Barton is a 87 y/o female, s/p Right TKA with the diagnosis of Osteoarthritis right knee.  ?Clinical Impression ? Patient instructed in and given written instructions for HEP with fair carryover demonstrated due to increasing right knee pain, demonstrates slow slightly labored movement for sitting up at bedside with fair/good return for moving RLE, limited for right knee flexion due to increased pain at end range, able to ambulate into hallway and tolerated sitting up in chair after therapy with family members present - nurse aware.  Patient will benefit from continued skilled physical therapy in hospital and recommended venue below to increase strength, balance, endurance for safe ADLs and gait.  ?  ? ?Recommendations for follow up therapy are one component of a multi-disciplinary discharge planning process, led by the attending physician.  Recommendations may be updated based on patient status, additional functional criteria and insurance authorization. ? ?Follow Up Recommendations Skilled nursing-short term rehab (<3 hours/day) ? ?  ?Assistance Recommended at Discharge Intermittent Supervision/Assistance  ?Patient can return home with the following ? A little help with walking and/or transfers;A little help with bathing/dressing/bathroom;Assistance with cooking/housework;Help with stairs or ramp for entrance ? ?  ?Equipment Recommendations None recommended by PT  ?Recommendations for Other Services ?    ?  ?Functional Status Assessment Patient has had a recent decline in their functional status and demonstrates the ability to make significant improvements in function in a reasonable and predictable amount of time.  ? ?  ?Precautions / Restrictions  Precautions ?Precautions: Fall ?Restrictions ?Weight Bearing Restrictions: Yes ?RLE Weight Bearing: Weight bearing as tolerated  ? ?  ? ?Mobility ? Bed Mobility ?Overal bed mobility: Needs Assistance ?Bed Mobility: Supine to Sit ?  ?  ?Supine to sit: Min guard, Min assist ?  ?  ?General bed mobility comments: increased time, labored movement ?  ? ?Transfers ?Overall transfer level: Needs assistance ?Equipment used: Rolling walker (2 wheels) ?Transfers: Sit to/from Stand, Bed to chair/wheelchair/BSC ?Sit to Stand: Min assist ?Stand pivot transfers: Min assist ?  ?  ?  ?  ?General transfer comment: labored movement, increased time ?  ? ?Ambulation/Gait ?Ambulation/Gait assistance: Min assist ?Gait Distance (Feet): 45 Feet ?Assistive device: Rolling walker (2 wheels) ?Gait Pattern/deviations: Decreased step length - right, Decreased step length - left, Decreased stride length, Decreased stance time - right, Antalgic ?Gait velocity: decreased ?  ?  ?General Gait Details: slow slightly labored cadence with fair return for right heel to toe stepping without loss of balance, limited mostly due to c/o right knee pain/fatigue ? ?Stairs ?  ?  ?  ?  ?  ? ?Wheelchair Mobility ?  ? ?Modified Rankin (Stroke Patients Only) ?  ? ?  ? ?Balance Overall balance assessment: Needs assistance ?Sitting-balance support: Feet supported, No upper extremity supported ?Sitting balance-Leahy Scale: Fair ?Sitting balance - Comments: fair/good seated at EOB ?  ?Standing balance support: During functional activity, Bilateral upper extremity supported ?Standing balance-Leahy Scale: Fair ?Standing balance comment: using RW ?  ?  ?  ?  ?  ?  ?  ?  ?  ?  ?  ?   ? ? ? ?Pertinent Vitals/Pain Pain Assessment ?Pain Assessment: Faces ?Pain Score: 6  ?Pain Location: right knee ?Pain Descriptors /  Indicators: Sore, Grimacing ?Pain Intervention(s): Limited activity within patient's tolerance, Monitored during session, Repositioned, Patient requesting pain  meds-RN notified  ? ? ?Home Living Family/patient expects to be discharged to:: Private residence ?Living Arrangements: Alone ?Available Help at Discharge: Family;Available PRN/intermittently ?Type of Home: House ?Home Access: Ramped entrance ?  ?  ?  ?Home Layout: One level ?Home Equipment: Cane - single Librarian, academic (2 wheels);BSC/3in1 ?   ?  ?Prior Function Prior Level of Function : Independent/Modified Independent ?  ?  ?  ?  ?  ?  ?Mobility Comments: household and short distanced community ambulator using Quad-cane ?ADLs Comments: Assisted with community ADLs by family ?  ? ? ?Hand Dominance  ? Dominant Hand: Right ? ?  ?Extremity/Trunk Assessment  ? Upper Extremity Assessment ?Upper Extremity Assessment: Overall WFL for tasks assessed ?  ? ?Lower Extremity Assessment ?Lower Extremity Assessment: Generalized weakness;RLE deficits/detail ?RLE Deficits / Details: grossly -4/5 ?RLE: Unable to fully assess due to pain ?RLE Sensation: WNL ?RLE Coordination: WNL ?  ? ?Cervical / Trunk Assessment ?Cervical / Trunk Assessment: Normal  ?Communication  ? Communication: No difficulties  ?Cognition Arousal/Alertness: Awake/alert ?Behavior During Therapy: [facility] for tasks assessed/performed ?Overall Cognitive Status: Within Functional Limits for tasks assessed ?  ?  ?  ?  ?  ?  ?  ?  ?  ?  ?  ?  ?  ?  ?  ?  ?  ?  ?  ? ?  ?General Comments   ? ?  ?Exercises Total Joint Exercises ?Ankle Circles/Pumps: Supine, 5 reps, Right, AAROM ?Quad Sets: Supine, 5 reps, Right, AROM ?Short Arc Quad: Supine, 10 reps, Right, AAROM ?Heel Slides: Supine, 10 reps, Right, AAROM ?Goniometric ROM: right knee 0 - 84 degrees  ? ?Assessment/Plan  ?  ?PT Assessment Patient needs continued PT services  ?PT Problem List Decreased strength;Decreased activity tolerance;Decreased mobility;Decreased range of motion ? ?   ?  ?PT Treatment Interventions DME instruction;Gait training;Stair training;Functional mobility training;Therapeutic  activities;Therapeutic exercise;Patient/family education;Balance training   ? ?PT Goals (Current goals can be found in the Care Plan section)  ?Acute Rehab PT Goals ?Patient Stated Goal: return  home after rehab ?PT Goal Formulation: With patient/family ?Time For Goal Achievement: 01/15/22 ?Potential to Achieve Goals: Good ? ?  ?Frequency BID ?  ? ? ?Co-evaluation   ?  ?  ?  ?  ? ? ?  ?AM-PAC PT "6 Clicks" Mobility  ?Outcome Measure Help needed turning from your back to your side while in a flat bed without using bedrails?: A Little ?Help needed moving from lying on your back to sitting on the side of a flat bed without using bedrails?: A Little ?Help needed moving to and from a bed to a chair (including a wheelchair)?: A Little ?Help needed standing up from a chair using your arms (e.g., wheelchair or bedside chair)?: A Little ?Help needed to walk in hospital room?: A Little ?Help needed climbing 3-5 steps with a railing? : A Lot ?6 Click Score: 17 ? ?  ?End of Session   ?Activity Tolerance: Patient tolerated treatment well;Patient limited by fatigue ?Patient left: in chair;with call bell/phone within reach;with family/visitor present ?Nurse Communication: Mobility status ?PT Visit Diagnosis: Unsteadiness on feet (R26.81);Other abnormalities of gait and mobility (R26.89);Muscle weakness (generalized) (M62.81) ?  ? ?Time: 4765-4650 ?PT Time Calculation (min) (ACUTE ONLY): 30 min ? ? ?Charges:   PT Evaluation ?$PT Eval Moderate Complexity: 1 Mod ?PT Treatments ?$Therapeutic Activity: 23-37 mins ?  ?   ? ? ?  4:10 PM, 01/01/22 ?Ocie BobJames Dorrien Grunder, MPT ?Physical Therapist with [place] ?[facility] ?[phone] office ?[phone] mobile phone ? ? ?

## 2022-01-01 NOTE — Anesthesia Preprocedure Evaluation (Signed)
Anesthesia Evaluation  ?Patient identified by MRN, date of birth, ID band ?Patient awake ? ? ? ?Reviewed: ?Allergy & Precautions, H&P , NPO status , Patient's Chart, lab work & pertinent test results, reviewed documented beta blocker date and time  ? ?Airway ?Mallampati: II ? ?TM Distance: >3 FB ?Neck ROM: full ? ? ? Dental ?no notable dental hx. ? ?  ?Pulmonary ?neg pulmonary ROS,  ?  ?Pulmonary exam normal ?breath sounds clear to auscultation ? ? ? ? ? ? Cardiovascular ?Exercise Tolerance: Good ?hypertension, negative cardio ROS ? ?Atrial Fibrillation  ?Rhythm:regular Rate:Normal ? ? ?  ?Neuro/Psych ?negative neurological ROS ? negative psych ROS  ? GI/Hepatic ?Neg liver ROS, GERD  Medicated,  ?Endo/Other  ?negative endocrine ROSdiabetes ? Renal/GU ?negative Renal ROS  ?negative genitourinary ?  ?Musculoskeletal ? ? Abdominal ?  ?Peds ? Hematology ?negative hematology ROS ?(+)   ?Anesthesia Other Findings ? ? Reproductive/Obstetrics ?negative OB ROS ? ?  ? ? ? ? ? ? ? ? ? ? ? ? ? ?  ?  ? ? ? ? ? ? ? ? ?Anesthesia Physical ?Anesthesia Plan ? ?ASA: 2 ? ?Anesthesia Plan: Spinal  ? ?Post-op Pain Management: Regional block*  ? ?Induction:  ? ?PONV Risk Score and Plan: Propofol infusion ? ?Airway Management Planned:  ? ?Additional Equipment:  ? ?Intra-op Plan:  ? ?Post-operative Plan:  ? ?Informed Consent: I have reviewed the patients History and Physical, chart, labs and discussed the procedure including the risks, benefits and alternatives for the proposed anesthesia with the patient or authorized representative who has indicated his/her understanding and acceptance.  ? ? ? ?Dental Advisory Given ? ?Plan Discussed with: CRNA ? ?Anesthesia Plan Comments:   ? ? ? ? ? ? ?Anesthesia Quick Evaluation ? ?

## 2022-01-01 NOTE — Interval H&P Note (Signed)
History and Physical Interval Note: ? ?01/01/2022 ?10:49 AM ? ?Norma Barton  has presented today for surgery, with the diagnosis of Osteoarthritis right knee.  The various methods of treatment have been discussed with the patient and family. After consideration of risks, benefits and other options for treatment, the patient has consented to  Procedure(s) with comments: ?TOTAL KNEE ARTHROPLASTY (Right) - Preoperative block or postoperative block if Dr. Keel is anesthesia that day, pt knows to arrive at 8:00 as a surgical intervention.  The patient's history has been reviewed, patient examined, no change in status, stable for surgery.  I have reviewed the patient's chart and labs.  Questions were answered to the patient's satisfaction.   ? ? ?Norma Barton ? ? ?

## 2022-01-01 NOTE — Op Note (Signed)
Dictation for total knee replacement ? ?01/01/2022 ?  ?1:10 PM ?  ?Preop diagnosis osteoarthritis right  KNEE ?  ?Postop diagnosis osteoarthritis right  KNEE ?  ?Procedure right total knee arthroplasty ?  ?Implants  ?  ?DePuy   Attune, fixed-bearing posterior stabilized total knee: Sizes: Femur   4   tibia  3   patella  35   POLYthylene 8 ?  ?Bone cuts:  ?Distal femur  9 ?  ?PROXIMAL TIBIA 2 medial side  ?  ?PATELLA 22-10=12       ?  ?  ?  ?ASSISTANTS: cynthia wrenn and nicki   ?  ?ANESTHESIA: spinal  ?  ?BLOOD ADMINISTERED:none ?  ?DRAINS: none  ?  ?LOCAL MEDICATIONS USED:  zynrelef ?SPECIMEN:  No Specimen ?  ?DISPOSITION OF SPECIMEN:  N/A ?  ?COUNTS:  YES ?  ?TOURNIQUET:  95min 275 mm  ?  ?DICTATION: .Dragon Dictation  ?  ?The patient was taken to the recovery room in stable condition ?  ?PLAN OF CARE: routine  ?  ?PATIENT DISPOSITION:  PACU - hemodynamically stable. ?  ?Delay start of Pharmacological VTE agent (>24hrs) due to surgical blood loss or risk of bleeding: not applicable ?  ?Details of surgery: ?The patient was identified by 2 approved identification mechanisms. The operative extremity was evaluated and found to be acceptable for surgical treatment today. The chart was reviewed. The surgical site was confirmed and marked. ?  ?The patient was taken to the operating room and given appropriate antibiotic ancef 2 gm . This is consistent with the SCIP protocol. ?  ?The patient was given the following anesthetic: spinal  ?  ?The patient was then placed supine on the operating table. A Foley catheter was inserted. The operative extremity was prepped and draped sterilely from the toes to the groin. ?  ?Timeout was executed confirming the patient's name, surgical site, antibiotic administration, x-rays available, and implants available. ?  ?The operative limb,  was exsanguinated with a six-inch Esmarch and the tourniquet was inflated to 275 mmHg. ?  ?A straight midline incision was made over the right  KNEE and  taken down to the extensor mechanism. A medial arthrotomy was performed. The patella was everted and the patellofemoral soft tissue was released, along with the patellar fat pad.  The anterior cruciate ligament and PCL were resected. ?  ?The anterior horns of the lateral and medial meniscus were resected. The medial soft tissue sleeve was elevated to the mid coronal plane. ?  ?A three-eighths inch drill bit was used to enter the femoral canal which was decompressed with suction and irrigation until clear.  ?  ?The distal femoral cutting guide was set for 9 mm distal resection,  5?valgus alignment, for a right  knee. The distal femur was resected and checked for flatness. ?  ?The Depuy Sigma sizing femoral guide was placed and the femur was sized to a size 4 .  ?  ?The external alignment guide for the tibial resection was then applied to the distal and proximal tibia and set for 3 degree  slope along with 2 MM resection  from the  medial side .  ?  ?Rotational alignment was set using the malleolus, the tibial tubercle and the tibial spines. ?  ?The proximal tibia was resected along with  residual menisci. The tibia was sized using a base plate to a size  3 .  ?  ?The extension gap was checked.  8 mm poly was stable   ?  ?  ?  A 4-in-1 cutting block was placed along with collateral ligament retractors and the distal femoral cuts were completed. ?  ?Spacer blocks were used to confirm equal flexion extension gaps with releases done as needed. A size 8 MM spacer block gave equal stability and flexion extension. ?  ?The correct sized notch cutting guide for the femur was then applied and the notch cut was made. ?  ?Trial reduction was completed using size4f 3t 8 poly  trial implants. Patella tracking was normal ?  ?We then skeletonized the patella. It measured 22 in thickness and the patellar resection was set for 12 millimeters. the patellar resection was completed. The patella diameter measured 35. We then drilled the peg  holes for the patella.  Completed patellar thickness was 21 ?  ?The proximal tibia was prepared using the size 3 base plate. ?  ?Thorough irrigation was performed and the bone was dried and prepared for cement. The cement was mixed on the back table using third generation preparation techniques ?  ?  ?The implants were then cemented in place and excess cement was removed. The cement was allowed to cure. Irrigation was repeated and excess and residual bone fragments and cement were removed. ?  ?Zynrelef was injected 400 mg  ?  ?The extensor mechanism was closed with #1 Bralon suture followed by subcutaneous tissue closure using 0 Monocryl suture ?  ?  ?Skin approximation was performed using staples ?  ?A sterile dressing was applied, followed by a ace wrapped from foot to thigh and then a Cryo/Cuff which was activated  ?  ?The patient was taken recovery room in stable condition ?   ?  ?  ?  ? ?

## 2022-01-01 NOTE — Brief Op Note (Signed)
Dictation for total knee replacement ? ?01/01/2022 ?  ?1:10 PM ?  ?Preop diagnosis osteoarthritis right  KNEE ?  ?Postop diagnosis osteoarthritis right  KNEE ?  ?Procedure right total knee arthroplasty ?  ?Implants  ?  ?DePuy   Attune, fixed-bearing posterior stabilized total knee: Sizes: Femur   4   tibia  3   patella  35   POLYthylene 8 ?  ?Bone cuts:  ?Distal femur  9 ?  ?PROXIMAL TIBIA 2 medial side  ?  ?PATELLA 22-10=12       ?  ?  ?  ?ASSISTANTS: cynthia wrenn and nicki   ?  ?ANESTHESIA: spinal  ?  ?BLOOD ADMINISTERED:none ?  ?DRAINS: none  ?  ?LOCAL MEDICATIONS USED:  zynrelef ?SPECIMEN:  No Specimen ?  ?DISPOSITION OF SPECIMEN:  N/A ?  ?COUNTS:  YES ?  ?TOURNIQUET:  86min 275 mm  ?  ?DICTATION: .Dragon Dictation  ?  ?The patient was taken to the recovery room in stable condition ?  ?PLAN OF CARE: routine  ?  ?PATIENT DISPOSITION:  PACU - hemodynamically stable. ?  ?Delay start of Pharmacological VTE agent (>24hrs) due to surgical blood loss or risk of bleeding: not applicable ?  ?Details of surgery: ?The patient was identified by 2 approved identification mechanisms. The operative extremity was evaluated and found to be acceptable for surgical treatment today. The chart was reviewed. The surgical site was confirmed and marked. ?  ?The patient was taken to the operating room and given appropriate antibiotic ancef 2 gm . This is consistent with the SCIP protocol. ?  ?The patient was given the following anesthetic: spinal  ?  ?The patient was then placed supine on the operating table. A Foley catheter was inserted. The operative extremity was prepped and draped sterilely from the toes to the groin. ?  ?Timeout was executed confirming the patient's name, surgical site, antibiotic administration, x-rays available, and implants available. ?  ?The operative limb,  was exsanguinated with a six-inch Esmarch and the tourniquet was inflated to 275 mmHg. ?  ?A straight midline incision was made over the right  KNEE and  taken down to the extensor mechanism. A medial arthrotomy was performed. The patella was everted and the patellofemoral soft tissue was released, along with the patellar fat pad.  The anterior cruciate ligament and PCL were resected. ?  ?The anterior horns of the lateral and medial meniscus were resected. The medial soft tissue sleeve was elevated to the mid coronal plane. ?  ?A three-eighths inch drill bit was used to enter the femoral canal which was decompressed with suction and irrigation until clear.  ?  ?The distal femoral cutting guide was set for 9 mm distal resection,  5?valgus alignment, for a right  knee. The distal femur was resected and checked for flatness. ?  ?The Depuy Sigma sizing femoral guide was placed and the femur was sized to a size 4 .  ?  ?The external alignment guide for the tibial resection was then applied to the distal and proximal tibia and set for 3 degree  slope along with 2 MM resection  from the  medial side .  ?  ?Rotational alignment was set using the malleolus, the tibial tubercle and the tibial spines. ?  ?The proximal tibia was resected along with  residual menisci. The tibia was sized using a base plate to a size  3 .  ?  ?The extension gap was checked.  8 mm poly was stable   ?  ?  ?  A 4-in-1 cutting block was placed along with collateral ligament retractors and the distal femoral cuts were completed. ?  ?Spacer blocks were used to confirm equal flexion extension gaps with releases done as needed. A size 8 MM spacer block gave equal stability and flexion extension. ?  ?The correct sized notch cutting guide for the femur was then applied and the notch cut was made. ?  ?Trial reduction was completed using size95f 3t 8 poly  trial implants. Patella tracking was normal ?  ?We then skeletonized the patella. It measured 22 in thickness and the patellar resection was set for 12 millimeters. the patellar resection was completed. The patella diameter measured 35. We then drilled the peg  holes for the patella.  Completed patellar thickness was 21 ?  ?The proximal tibia was prepared using the size 3 base plate. ?  ?Thorough irrigation was performed and the bone was dried and prepared for cement. The cement was mixed on the back table using third generation preparation techniques ?  ?  ?The implants were then cemented in place and excess cement was removed. The cement was allowed to cure. Irrigation was repeated and excess and residual bone fragments and cement were removed. ?  ?Zynrelef was injected 400 mg  ?  ?The extensor mechanism was closed with #1 Bralon suture followed by subcutaneous tissue closure using 0 Monocryl suture ?  ?  ?Skin approximation was performed using staples ?  ?A sterile dressing was applied, followed by a ace wrapped from foot to thigh and then a Cryo/Cuff which was activated  ?  ?The patient was taken recovery room in stable condition ?   ?  ?  ?  ? ?

## 2022-01-01 NOTE — Brief Op Note (Signed)
Dictation for total knee replacement ? ?01/01/2022 ?  ?1:10 PM ?  ?Preop diagnosis osteoarthritis right  KNEE ?  ?Postop diagnosis osteoarthritis right  KNEE ?  ?Procedure right total knee arthroplasty ?  ?Implants  ?  ?DePuy   Attune, fixed-bearing posterior stabilized total knee: Sizes: Femur   4   tibia  3   patella  35   POLYthylene 8 ?  ?Bone cuts:  ?Distal femur  9 ?  ?PROXIMAL TIBIA 2 medial side  ?  ?PATELLA 22-10=12       ?  ?  ?  ?ASSISTANTS: cynthia wrenn and nicki   ?  ?ANESTHESIA: spinal  ?  ?BLOOD ADMINISTERED:none ?  ?DRAINS: none  ?  ?LOCAL MEDICATIONS USED:  zynrelef ?SPECIMEN:  No Specimen ?  ?DISPOSITION OF SPECIMEN:  N/A ?  ?COUNTS:  YES ?  ?TOURNIQUET:  95min 275 mm  ?  ?DICTATION: .Dragon Dictation  ?  ?The patient was taken to the recovery room in stable condition ?  ?PLAN OF CARE: routine  ?  ?PATIENT DISPOSITION:  PACU - hemodynamically stable. ?  ?Delay start of Pharmacological VTE agent (>24hrs) due to surgical blood loss or risk of bleeding: not applicable ?  ?Details of surgery: ?The patient was identified by 2 approved identification mechanisms. The operative extremity was evaluated and found to be acceptable for surgical treatment today. The chart was reviewed. The surgical site was confirmed and marked. ?  ?The patient was taken to the operating room and given appropriate antibiotic ancef 2 gm . This is consistent with the SCIP protocol. ?  ?The patient was given the following anesthetic: spinal  ?  ?The patient was then placed supine on the operating table. A Foley catheter was inserted. The operative extremity was prepped and draped sterilely from the toes to the groin. ?  ?Timeout was executed confirming the patient's name, surgical site, antibiotic administration, x-rays available, and implants available. ?  ?The operative limb,  was exsanguinated with a six-inch Esmarch and the tourniquet was inflated to 275 mmHg. ?  ?A straight midline incision was made over the right  KNEE and  taken down to the extensor mechanism. A medial arthrotomy was performed. The patella was everted and the patellofemoral soft tissue was released, along with the patellar fat pad.  The anterior cruciate ligament and PCL were resected. ?  ?The anterior horns of the lateral and medial meniscus were resected. The medial soft tissue sleeve was elevated to the mid coronal plane. ?  ?A three-eighths inch drill bit was used to enter the femoral canal which was decompressed with suction and irrigation until clear.  ?  ?The distal femoral cutting guide was set for 9 mm distal resection,  5?valgus alignment, for a right  knee. The distal femur was resected and checked for flatness. ?  ?The Depuy Sigma sizing femoral guide was placed and the femur was sized to a size 4 .  ?  ?The external alignment guide for the tibial resection was then applied to the distal and proximal tibia and set for 3 degree  slope along with 2 MM resection  from the  medial side .  ?  ?Rotational alignment was set using the malleolus, the tibial tubercle and the tibial spines. ?  ?The proximal tibia was resected along with  residual menisci. The tibia was sized using a base plate to a size  3 .  ?  ?The extension gap was checked.  8 mm poly was stable   ?  ?  ?  A 4-in-1 cutting block was placed along with collateral ligament retractors and the distal femoral cuts were completed. ?  ?Spacer blocks were used to confirm equal flexion extension gaps with releases done as needed. A size 8 MM spacer block gave equal stability and flexion extension. ?  ?The correct sized notch cutting guide for the femur was then applied and the notch cut was made. ?  ?Trial reduction was completed using size4f 3t 8 poly  trial implants. Patella tracking was normal ?  ?We then skeletonized the patella. It measured 22 in thickness and the patellar resection was set for 12 millimeters. the patellar resection was completed. The patella diameter measured 35. We then drilled the peg  holes for the patella.  Completed patellar thickness was 21 ?  ?The proximal tibia was prepared using the size 3 base plate. ?  ?Thorough irrigation was performed and the bone was dried and prepared for cement. The cement was mixed on the back table using third generation preparation techniques ?  ?  ?The implants were then cemented in place and excess cement was removed. The cement was allowed to cure. Irrigation was repeated and excess and residual bone fragments and cement were removed. ?  ?Zynrelef was injected 400 mg  ?  ?The extensor mechanism was closed with #1 Bralon suture followed by subcutaneous tissue closure using 0 Monocryl suture ?  ?  ?Skin approximation was performed using staples ?  ?A sterile dressing was applied, followed by a ace wrapped from foot to thigh and then a Cryo/Cuff which was activated  ?  ?The patient was taken recovery room in stable condition ?   ?  ?  ?  ? ?

## 2022-01-02 ENCOUNTER — Encounter (HOSPITAL_COMMUNITY): Payer: Self-pay | Admitting: Orthopedic Surgery

## 2022-01-02 DIAGNOSIS — Z79899 Other long term (current) drug therapy: Secondary | ICD-10-CM | POA: Diagnosis not present

## 2022-01-02 DIAGNOSIS — M1711 Unilateral primary osteoarthritis, right knee: Secondary | ICD-10-CM | POA: Diagnosis not present

## 2022-01-02 DIAGNOSIS — I10 Essential (primary) hypertension: Secondary | ICD-10-CM | POA: Diagnosis not present

## 2022-01-02 DIAGNOSIS — Z7901 Long term (current) use of anticoagulants: Secondary | ICD-10-CM | POA: Diagnosis not present

## 2022-01-02 DIAGNOSIS — Z96652 Presence of left artificial knee joint: Secondary | ICD-10-CM | POA: Diagnosis not present

## 2022-01-02 DIAGNOSIS — E119 Type 2 diabetes mellitus without complications: Secondary | ICD-10-CM | POA: Diagnosis not present

## 2022-01-02 LAB — BASIC METABOLIC PANEL WITH GFR
Anion gap: 6 (ref 5–15)
BUN: 15 mg/dL (ref 8–23)
CO2: 26 mmol/L (ref 22–32)
Calcium: 8.1 mg/dL — ABNORMAL LOW (ref 8.9–10.3)
Chloride: 107 mmol/L (ref 98–111)
Creatinine, Ser: 0.9 mg/dL (ref 0.44–1.00)
GFR, Estimated: >60 mL/min
Glucose, Bld: 119 mg/dL — ABNORMAL HIGH (ref 70–99)
Potassium: 3.7 mmol/L (ref 3.5–5.1)
Sodium: 139 mmol/L (ref 135–145)

## 2022-01-02 LAB — CBC
HCT: 36 % (ref 36.0–46.0)
Hemoglobin: 11.4 g/dL — ABNORMAL LOW (ref 12.0–15.0)
MCH: 31.6 pg (ref 26.0–34.0)
MCHC: 31.7 g/dL (ref 30.0–36.0)
MCV: 99.7 fL (ref 80.0–100.0)
Platelets: 100 10*3/uL — ABNORMAL LOW (ref 150–400)
RBC: 3.61 MIL/uL — ABNORMAL LOW (ref 3.87–5.11)
RDW: 12.4 % (ref 11.5–15.5)
WBC: 6.4 10*3/uL (ref 4.0–10.5)
nRBC: 0 % (ref 0.0–0.2)

## 2022-01-02 NOTE — TOC Progression Note (Signed)
Transition of Care (TOC) - Progression Note  ? ? ?Patient Details  ?Name: OLGA BOURBEAU ?MRN: [id number] ?Date of Birth: Jan 31, 1935 ? ?Transition of Care (TOC) CM/SW Contact  ?Villa Herb, LCSWA ?Phone Number: ?01/02/2022, 2:30 PM ? ?Clinical Narrative:    ?CSW spoke with pt to go over bed offers at this time. Pt states that she would like to accept the bed at [facility] for SNF. CSW updated [facility]. TOC will start insurance auth. TOC to follow.  ? ?Expected Discharge Plan: Skilled Nursing Facility ?Barriers to Discharge: Continued Medical Work up ? ?Expected Discharge Plan and Services ?Expected Discharge Plan: Skilled Nursing Facility ?In-house Referral: Clinical Social Work ?  ?Post Acute Care Choice: Skilled Nursing Facility ?Living arrangements for the past 2 months: Single Family Home ?                ?  ?  ?  ?  ?  ?  ?  ?  ?  ?  ? ? ?Social Determinants of Health (SDOH) Interventions ?  ? ?Readmission Risk Interventions ?   ? View : No data to display.  ?  ?  ?  ? ? ?

## 2022-01-02 NOTE — Care Management Obs Status (Signed)
MEDICARE OBSERVATION STATUS NOTIFICATION ? ? ?Patient Details  ?Name: Norma Barton ?MRN: [id number] ?Date of Birth: Aug 06, 1935 ? ? ?Medicare Observation Status Notification Given:  Yes ? ? ? ?Corey Harold ?01/02/2022, 4:01 PM ?

## 2022-01-02 NOTE — Progress Notes (Signed)
Physical Therapy Treatment ?Patient Details ?Name: Norma Barton ?MRN: [id number] ?DOB: Jan 15, 1935 ?Today's Date: 01/02/2022 ? ?RIGHT KNEE ROM:  0 - 90 degrees ?AMBULATION DISTANCE: 60 feet using RW with Min assist ? ? ?History of Present Illness EVVIE BEHRMANN is a 87 y/o female, s/p Right TKA with the diagnosis of Osteoarthritis right knee. ? ?  ?PT Comments  ? ? Patient demonstrates increased RLE strength for completing SAQ's and heel slides with less c/o pain, good return for transferring to/from [facility] to urinate, and slightly increased endurance for taking steps during gait training in hallway.  Patient tolerated sitting up in chair after therapy with RLE dangling - nurse notified.  Patient will benefit from continued skilled physical therapy in hospital and recommended venue below to increase strength, balance, endurance for safe ADLs and gait.  ?   ?Recommendations for follow up therapy are one component of a multi-disciplinary discharge planning process, led by the attending physician.  Recommendations may be updated based on patient status, additional functional criteria and insurance authorization. ? ?Follow Up Recommendations ? Skilled nursing-short term rehab (<3 hours/day) ?  ?  ?Assistance Recommended at Discharge Intermittent Supervision/Assistance  ?Patient can return home with the following A little help with walking and/or transfers;A little help with bathing/dressing/bathroom;Assistance with cooking/housework;Help with stairs or ramp for entrance ?  ?Equipment Recommendations ? None recommended by PT  ?  ?Recommendations for Other Services   ? ? ?  ?Precautions / Restrictions Precautions ?Precautions: Fall ?Restrictions ?Weight Bearing Restrictions: Yes ?RLE Weight Bearing: Weight bearing as tolerated  ?  ? ?Mobility ? Bed Mobility ?Overal bed mobility: Needs Assistance ?Bed Mobility: Supine to Sit ?  ?  ?Supine to sit: Independent, Modified independent (Device/Increase time) ?  ?  ?General bed  mobility comments: good return for moving RLE during bed mobility ?  ? ?Transfers ?Overall transfer level: Needs assistance ?Equipment used: Rolling walker (2 wheels) ?Transfers: Sit to/from Stand, Bed to chair/wheelchair/BSC ?Sit to Stand: Min assist ?Stand pivot transfers: Min assist ?  ?  ?  ?  ?General transfer comment: good return for transferring to/from [facility] ?  ? ?Ambulation/Gait ?Ambulation/Gait assistance: Min assist ?Gait Distance (Feet): 60 Feet ?Assistive device: Rolling walker (2 wheels) ?Gait Pattern/deviations: Decreased step length - right, Decreased step length - left, Decreased stride length, Decreased stance time - right, Antalgic ?Gait velocity: decreased ?  ?  ?General Gait Details: slightly increased tolerance for taking steps in hallway with fair/good return for right heel to toe stepping without loss of balance and less c/o right knee pain ? ? ?Stairs ?  ?  ?  ?  ?  ? ? ?Wheelchair Mobility ?  ? ?Modified Rankin (Stroke Patients Only) ?  ? ? ?  ?Balance Overall balance assessment: Needs assistance ?Sitting-balance support: Feet supported, No upper extremity supported ?Sitting balance-Leahy Scale: Fair ?Sitting balance - Comments: fair/good seated at EOB ?  ?Standing balance support: During functional activity, Bilateral upper extremity supported ?Standing balance-Leahy Scale: Fair ?Standing balance comment: fair/good using RW ?  ?  ?  ?  ?  ?  ?  ?  ?  ?  ?  ?  ? ?  ?Cognition Arousal/Alertness: Awake/alert ?Behavior During Therapy: [facility] for tasks assessed/performed ?Overall Cognitive Status: Within Functional Limits for tasks assessed ?  ?  ?  ?  ?  ?  ?  ?  ?  ?  ?  ?  ?  ?  ?  ?  ?  ?  ?  ? ?  ?  Exercises Total Joint Exercises ?Ankle Circles/Pumps: Supine, Right, 10 reps, AROM ?Quad Sets: Supine, Right, AROM, 10 reps ?Short Arc Quad: Supine, 10 reps, Right, AAROM, AROM ?Heel Slides: Right, AROM, 10 reps, Supine ?Goniometric ROM: Right knee 0 - 90 degrees ? ?  ?General Comments   ?  ?   ? ?Pertinent Vitals/Pain Pain Assessment ?Pain Assessment: Faces ?Pain Score: 7  ?Faces Pain Scale: Hurts a little bit ?Pain Location: right knee ?Pain Descriptors / Indicators: Sore ?Pain Intervention(s): Limited activity within patient's tolerance, Monitored during session, Repositioned  ? ? ?Home Living   ?  ?  ?  ?  ?  ?  ?  ?  ?  ?   ?  ?Prior Function    ?  ?  ?   ? ?PT Goals (current goals can now be found in the care plan section) Acute Rehab PT Goals ?Patient Stated Goal: return  home after rehab ?PT Goal Formulation: With patient ?Time For Goal Achievement: 01/15/22 ?Potential to Achieve Goals: Good ?Progress towards PT goals: Progressing toward goals ? ?  ?Frequency ? ? ? BID ? ? ? ?  ?PT Plan Current plan remains appropriate  ? ? ?Co-evaluation   ?  ?  ?  ?  ? ?  ?AM-PAC PT "6 Clicks" Mobility   ?Outcome Measure ? Help needed turning from your back to your side while in a flat bed without using bedrails?: None ?Help needed moving from lying on your back to sitting on the side of a flat bed without using bedrails?: A Little ?Help needed moving to and from a bed to a chair (including a wheelchair)?: A Little ?Help needed standing up from a chair using your arms (e.g., wheelchair or bedside chair)?: A Little ?Help needed to walk in hospital room?: A Little ?Help needed climbing 3-5 steps with a railing? : A Lot ?6 Click Score: 18 ? ?  ?End of Session   ?Activity Tolerance: Patient tolerated treatment well;Patient limited by fatigue ?Patient left: in chair;with call bell/phone within reach ?Nurse Communication: Mobility status ?PT Visit Diagnosis: Unsteadiness on feet (R26.81);Other abnormalities of gait and mobility (R26.89);Muscle weakness (generalized) (M62.81) ?  ? ? ?Time: 0814-4818 ?PT Time Calculation (min) (ACUTE ONLY): 27 min ? ?Charges:  $Gait Training: 8-22 mins ?$Therapeutic Exercise: 8-22 mins          ?          ? ?3:58 PM, 01/02/22 ?Ocie Bob, MPT ?Physical Therapist with  [place] ?[facility] ?[phone] office ?[phone] mobile phone ? ? ?

## 2022-01-02 NOTE — Progress Notes (Signed)
Patient ID: Norma Barton, female   DOB: June 06, 1935, 87 y.o.   MRN: [id number] ? ?BP (!) 92/46 (BP Location: Left Arm)   Pulse 60   Temp 98 ?F (36.7 ?C)   Resp 19   Ht 5\' 2"  (1.575 m)   Wt 72 kg   SpO2 99%   BMI 29.03 kg/m?  ? ?Doing well  ? ?Needs rehab as inpatient in snf facility. ? ? ?  Latest Ref Rng & Units 01/02/2022  ?  5:13 AM 12/28/2021  ?  1:51 PM 11/02/2021  ? 12:04 PM  ?CBC  ?WBC 4.0 - 10.5 K/uL 6.4   5.2   5.1    ?Hemoglobin 12.0 - 15.0 g/dL 11.4   15.0   15.3    ?Hematocrit 36.0 - 46.0 % 36.0   45.7   44.2    ?Platelets 150 - 400 K/uL 100   127   142    ? ?Stable  ? ? ?

## 2022-01-02 NOTE — Progress Notes (Signed)
Physical Therapy Treatment ?Patient Details ?Name: Norma Barton ?MRN: [id number] ?DOB: [birth year] ?Today's Date: 01/02/2022 ? ?RIGHT KNEE ROM:  0 - 90 degrees ?AMBULATION DISTANCE: 50 feet using RW with Min assist ? ?History of Present Illness [name] is a 87 y/o female, s/p Right TKA with the diagnosis of Osteoarthritis right knee. ? ?  ?PT Comments  ? ? Patient demonstrates good return for moving RLE during bed mobility, able to achieve up to 90 degrees right knee flexion self stretching while seated at bedside, and slightly increased endurance/distance for gait training with fair/good return for right heel to toe stepping without loss of balance.  Patient tolerated sitting up in chair with RLE dangling after therapy.  Patient will benefit from continued skilled physical therapy in hospital and recommended venue below to increase strength, balance, endurance for safe ADLs and gait.  ?   ?Recommendations for follow up therapy are one component of a multi-disciplinary discharge planning process, led by the attending physician.  Recommendations may be updated based on patient status, additional functional criteria and insurance authorization. ? ?Follow Up Recommendations ? Skilled nursing-short term rehab (<3 hours/day) ?  ?  ?Assistance Recommended at Discharge    ?Patient can return home with the following A little help with walking and/or transfers;A little help with bathing/dressing/bathroom;Assistance with cooking/housework;Help with stairs or ramp for entrance ?  ?Equipment Recommendations ? None recommended by PT  ?  ?Recommendations for Other Services   ? ? ?  ?Precautions / Restrictions Precautions ?Precautions: Fall ?Restrictions ?Weight Bearing Restrictions: Yes ?RLE Weight Bearing: Weight bearing as tolerated  ?  ? ?Mobility ? Bed Mobility ?Overal bed mobility: Needs Assistance ?Bed Mobility: Supine to Sit ?  ?  ?Supine to sit: Supervision ?  ?  ?General bed mobility comments: good return for  moving RLE during bed mobility ?  ? ?Transfers ?Overall transfer level: Needs assistance ?Equipment used: Rolling walker (2 wheels) ?Transfers: Sit to/from Stand, Bed to chair/wheelchair/BSC ?Sit to Stand: Min assist ?Stand pivot transfers: Min assist ?  ?  ?  ?  ?General transfer comment: increased time, labored movement ?  ? ?Ambulation/Gait ?Ambulation/Gait assistance: Min assist ?Gait Distance (Feet): 55 Feet ?Assistive device: Rolling walker (2 wheels) ?Gait Pattern/deviations: Decreased step length - right, Decreased step length - left, Decreased stride length, Decreased stance time - right, Antalgic ?Gait velocity: decreased ?  ?  ?General Gait Details: increased endurance/distance for gait training with fair/good return for right heel to toe stepping without loss of balance, limited mosty due to c/o fatigue and right knee pain ? ? ?Stairs ?  ?  ?  ?  ?  ? ? ?Wheelchair Mobility ?  ? ?Modified Rankin (Stroke Patients Only) ?  ? ? ?  ?Balance Overall balance assessment: Needs assistance ?Sitting-balance support: Feet supported, No upper extremity supported ?Sitting balance-Leahy Scale: Fair ?Sitting balance - Comments: fair/good seated at EOB ?  ?Standing balance support: During functional activity, Bilateral upper extremity supported ?Standing balance-Leahy Scale: Fair ?Standing balance comment: fair/good using RW ?  ?  ?  ?  ?  ?  ?  ?  ?  ?  ?  ?  ? ?  ?Cognition Arousal/Alertness: Awake/alert ?Behavior During Therapy: [facility] for tasks assessed/performed ?Overall Cognitive Status: Within Functional Limits for tasks assessed ?  ?  ?  ?  ?  ?  ?  ?  ?  ?  ?  ?  ?  ?  ?  ?  ?  ?  ?  ? ?  ?  Exercises Total Joint Exercises ?Ankle Circles/Pumps: Supine, Right, 10 reps, AROM ?Quad Sets: Supine, Right, AROM, 10 reps ?Short Arc Quad: Supine, 10 reps, Right, AAROM, AROM ?Heel Slides: 15 reps, Right, AROM ?Goniometric ROM: Right knee 0 - 90 degrees ? ?  ?General Comments   ?  ?  ? ?Pertinent Vitals/Pain Pain  Assessment ?Pain Assessment: 0-10 ?Pain Score: 7  ?Pain Location: right knee ?Pain Descriptors / Indicators: Sore, Guarding ?Pain Intervention(s): Limited activity within patient's tolerance, Monitored during session, Repositioned, Premedicated before session  ? ? ?Home Living   ?  ?  ?  ?  ?  ?  ?  ?  ?  ?   ?  ?Prior Function    ?  ?  ?   ? ?PT Goals (current goals can now be found in the care plan section) Acute Rehab PT Goals ?Patient Stated Goal: return  home after rehab ?PT Goal Formulation: With patient ?Time For Goal Achievement: 01/15/22 ?Potential to Achieve Goals: Good ?Progress towards PT goals: Progressing toward goals ? ?  ?Frequency ? ? ? BID ? ? ? ?  ?PT Plan Current plan remains appropriate  ? ? ?Co-evaluation   ?  ?  ?  ?  ? ?  ?AM-PAC PT "6 Clicks" Mobility   ?Outcome Measure ? Help needed turning from your back to your side while in a flat bed without using bedrails?: None ?Help needed moving from lying on your back to sitting on the side of a flat bed without using bedrails?: A Little ?Help needed moving to and from a bed to a chair (including a wheelchair)?: A Little ?Help needed standing up from a chair using your arms (e.g., wheelchair or bedside chair)?: A Little ?Help needed to walk in hospital room?: A Little ?Help needed climbing 3-5 steps with a railing? : A Lot ?6 Click Score: 18 ? ?  ?End of Session   ?Activity Tolerance: Patient tolerated treatment well;Patient limited by fatigue ?Patient left: in chair;with call bell/phone within reach ?Nurse Communication: Mobility status ?PT Visit Diagnosis: Unsteadiness on feet (R26.81);Other abnormalities of gait and mobility (R26.89);Muscle weakness (generalized) (M62.81) ?  ? ? ?Time: [id number] ?PT Time Calculation (min) (ACUTE ONLY): 30 min ? ?Charges:  $Gait Training: 8-22 mins ?$Therapeutic Exercise: 8-22 mins          ?          ? ?10:02 AM, 01/02/22 ?Lonell Grandchild, MPT ?Physical Therapist with [place] ?[facility] ?[phone]  office ?[phone] mobile phone ? ? ?

## 2022-01-02 NOTE — Progress Notes (Signed)
Foley Cath removed.

## 2022-01-03 DIAGNOSIS — Z96652 Presence of left artificial knee joint: Secondary | ICD-10-CM | POA: Diagnosis not present

## 2022-01-03 DIAGNOSIS — Z79899 Other long term (current) drug therapy: Secondary | ICD-10-CM | POA: Diagnosis not present

## 2022-01-03 DIAGNOSIS — M1711 Unilateral primary osteoarthritis, right knee: Secondary | ICD-10-CM

## 2022-01-03 DIAGNOSIS — Z471 Aftercare following joint replacement surgery: Secondary | ICD-10-CM | POA: Diagnosis not present

## 2022-01-03 DIAGNOSIS — Z96651 Presence of right artificial knee joint: Secondary | ICD-10-CM | POA: Diagnosis not present

## 2022-01-03 DIAGNOSIS — R2689 Other abnormalities of gait and mobility: Secondary | ICD-10-CM | POA: Diagnosis not present

## 2022-01-03 DIAGNOSIS — K5904 Chronic idiopathic constipation: Secondary | ICD-10-CM | POA: Diagnosis not present

## 2022-01-03 DIAGNOSIS — I4891 Unspecified atrial fibrillation: Secondary | ICD-10-CM | POA: Diagnosis not present

## 2022-01-03 DIAGNOSIS — R41841 Cognitive communication deficit: Secondary | ICD-10-CM | POA: Diagnosis not present

## 2022-01-03 DIAGNOSIS — I10 Essential (primary) hypertension: Secondary | ICD-10-CM | POA: Diagnosis not present

## 2022-01-03 DIAGNOSIS — E119 Type 2 diabetes mellitus without complications: Secondary | ICD-10-CM | POA: Diagnosis not present

## 2022-01-03 DIAGNOSIS — E876 Hypokalemia: Secondary | ICD-10-CM | POA: Diagnosis not present

## 2022-01-03 DIAGNOSIS — Z7901 Long term (current) use of anticoagulants: Secondary | ICD-10-CM | POA: Diagnosis not present

## 2022-01-03 DIAGNOSIS — M6281 Muscle weakness (generalized): Secondary | ICD-10-CM | POA: Diagnosis not present

## 2022-01-03 DIAGNOSIS — K219 Gastro-esophageal reflux disease without esophagitis: Secondary | ICD-10-CM | POA: Diagnosis not present

## 2022-01-03 LAB — CBC
HCT: 33.7 % — ABNORMAL LOW (ref 36.0–46.0)
Hemoglobin: 10.9 g/dL — ABNORMAL LOW (ref 12.0–15.0)
MCH: 32.2 pg (ref 26.0–34.0)
MCHC: 32.3 g/dL (ref 30.0–36.0)
MCV: 99.7 fL (ref 80.0–100.0)
Platelets: 91 10*3/uL — ABNORMAL LOW (ref 150–400)
RBC: 3.38 MIL/uL — ABNORMAL LOW (ref 3.87–5.11)
RDW: 12.5 % (ref 11.5–15.5)
WBC: 9.2 10*3/uL (ref 4.0–10.5)
nRBC: 0 % (ref 0.0–0.2)

## 2022-01-03 MED ORDER — DOCUSATE SODIUM 100 MG PO CAPS: 100.0000 mg | ORAL_CAPSULE | Freq: Two times a day (BID) | ORAL | 0 refills | Status: AC

## 2022-01-03 MED ORDER — METHOCARBAMOL 500 MG PO TABS: 500.0000 mg | ORAL_TABLET | Freq: Four times a day (QID) | ORAL | 1 refills | Status: DC | PRN

## 2022-01-03 MED ORDER — HYDROCODONE-ACETAMINOPHEN 5-325 MG PO TABS: 1.0000 | ORAL_TABLET | ORAL | 0 refills | Status: DC | PRN

## 2022-01-03 MED ORDER — POLYETHYLENE GLYCOL 3350 17 G PO PACK: 17.0000 g | PACK | Freq: Every day | ORAL | 0 refills | Status: DC | PRN

## 2022-01-03 MED ORDER — TRAMADOL HCL 50 MG PO TABS: 50.0000 mg | ORAL_TABLET | Freq: Four times a day (QID) | ORAL | 0 refills | Status: DC

## 2022-01-03 NOTE — Progress Notes (Signed)
Called report to [facility]. Pt awaiting transport. ?

## 2022-01-03 NOTE — Progress Notes (Signed)
Physical Therapy Treatment ?Patient Details ?Name: Norma Barton ?MRN: [id number] ?DOB: 1935-05-31 ?Today's Date: 01/03/2022 ? ?RIGHT KNEE ROM:  0 - 85 degrees ?AMBULATION DISTANCE: 60 feet using RW with Min assist/min guard ? ?History of Present Illness KALEEAH Barton is a 87 y/o female, s/p Right TKA with the diagnosis of Osteoarthritis right knee. ? ?  ?PT Comments  ? ? Patient demonstrates increased RLE strength for completing heel slides and quad sets with less c/o pain, good return for transferring to/from bed, ambulating to bathroom with RW and transferring to commode to urinate, and slightly increased endurance for taking steps during gait training in hallway.  Patient required extra attempts and min assist to stand from standard commode height. Patient required cues for sequencing of steps and placement of hands for transfers and cues for right knee flexion during swing phase of gait. Patient tolerated sitting up in chair after therapy with RLE dangling - nurse notified.  Patient will benefit from continued skilled physical therapy in hospital and recommended venue below to increase strength, balance, endurance for safe ADLs and gait.  ?  ?Recommendations for follow up therapy are one component of a multi-disciplinary discharge planning process, led by the attending physician.  Recommendations may be updated based on patient status, additional functional criteria and insurance authorization. ? ?Follow Up Recommendations ? Skilled nursing-short term rehab (<3 hours/day) ?  ?  ?Assistance Recommended at Discharge Intermittent Supervision/Assistance  ?Patient can return home with the following A little help with walking and/or transfers;A little help with bathing/dressing/bathroom;Assistance with cooking/housework;Help with stairs or ramp for entrance ?  ?Equipment Recommendations ? None recommended by PT  ?  ?Recommendations for Other Services   ? ? ?  ?Precautions / Restrictions Precautions ?Precautions:  Fall ?Restrictions ?Weight Bearing Restrictions: Yes ?RLE Weight Bearing: Weight bearing as tolerated  ?  ? ?Mobility ? Bed Mobility ?Overal bed mobility: Needs Assistance ?Bed Mobility: Supine to Sit ?  ?  ?Supine to sit: Modified independent (Device/Increase time), HOB elevated ?  ?  ?General bed mobility comments: good return for moving RLE during bed mobility ?  ? ?Transfers ?Overall transfer level: Needs assistance ?Equipment used: Rolling walker (2 wheels) ?Transfers: Sit to/from Stand, Bed to chair/wheelchair/BSC ?Sit to Stand: Min assist, Min guard ?Stand pivot transfers: Min assist, Min guard ?Step pivot transfers: Min assist, Min guard ?  ?Anterior-Posterior transfers: Supervision ?  ?General transfer comment: good return for transferring to/from [facility]; cues for sequencing of steps and placement of hands. ?  ? ?Ambulation/Gait ?Ambulation/Gait assistance: Min assist, Min guard ?Gait Distance (Feet): 60 Feet ?Assistive device: Rolling walker (2 wheels) ?Gait Pattern/deviations: Decreased step length - right, Decreased step length - left, Decreased stride length, Decreased stance time - right, Antalgic ?Gait velocity: decreased ?  ?  ?General Gait Details: slightly increased tolerance for taking steps in hallway with fair/good return for right heel to toe stepping without loss of balance and less c/o right knee pain; cues for right knee flexion in swing phase of gait ? ? ?Stairs ?  ?  ?  ?  ?  ? ? ?Wheelchair Mobility ?  ? ?Modified Rankin (Stroke Patients Only) ?  ? ? ?  ?Balance Overall balance assessment: Needs assistance ?Sitting-balance support: Feet supported, No upper extremity supported ?Sitting balance-Leahy Scale: Fair ?Sitting balance - Comments: fair/good seated at EOB ?  ?Standing balance support: During functional activity, Bilateral upper extremity supported ?Standing balance-Leahy Scale: Fair ?Standing balance comment: fair/good using RW ?  ?  ? ?  ?  Cognition Arousal/Alertness:  Awake/alert ?Behavior During Therapy: [facility] for tasks assessed/performed ?Overall Cognitive Status: Within Functional Limits for tasks assessed ?  ?  ?  ? ?  ?Exercises Total Joint Exercises ?Ankle Circles/Pumps: Supine, Right, 10 reps, AROM ?Quad Sets: Supine, Right, AROM, 10 reps ?Short Arc Quad: Supine, 10 reps, Right, AAROM, AROM ?Heel Slides: Right, AROM, 10 reps, Supine ?Goniometric ROM: 0-85 PROM; 75 AROM ? ?  ?General Comments   ?  ?  ? ?Pertinent Vitals/Pain Pain Assessment ?Pain Assessment: Faces ?Faces Pain Scale: Hurts even more ?Pain Location: right knee ?Pain Descriptors / Indicators: Aching, Tightness, Throbbing, Grimacing, Moaning ?Pain Intervention(s): Limited activity within patient's tolerance, Monitored during session, Premedicated before session  ? ? ?Home Living   ?  ?  ?  ?Prior Function    ?  ?   ? ?PT Goals (current goals can now be found in the care plan section) Acute Rehab PT Goals ?Patient Stated Goal: return  home after rehab ?PT Goal Formulation: With patient ?Time For Goal Achievement: 01/15/22 ?Potential to Achieve Goals: Good ?Progress towards PT goals: Progressing toward goals ? ?  ?Frequency ? ? ? BID ? ? ? ?  ?PT Plan Current plan remains appropriate  ? ? ?   ?AM-PAC PT "6 Clicks" Mobility   ?Outcome Measure ? Help needed turning from your back to your side while in a flat bed without using bedrails?: None ?Help needed moving from lying on your back to sitting on the side of a flat bed without using bedrails?: A Little ?Help needed moving to and from a bed to a chair (including a wheelchair)?: A Little ?Help needed standing up from a chair using your arms (e.g., wheelchair or bedside chair)?: A Little ?Help needed to walk in hospital room?: A Little ?Help needed climbing 3-5 steps with a railing? : A Lot ?6 Click Score: 18 ? ?  ?End of Session Equipment Utilized During Treatment: Gait belt ?Activity Tolerance: Patient tolerated treatment well;Patient limited by fatigue ?Patient  left: in chair;with call bell/phone within reach ?Nurse Communication: Mobility status ?PT Visit Diagnosis: Unsteadiness on feet (R26.81);Other abnormalities of gait and mobility (R26.89);Muscle weakness (generalized) (M62.81) ?  ? ? ?Time: 1030-1058 ?PT Time Calculation (min) (ACUTE ONLY): 28 min ? ?Charges:  $Therapeutic Exercise: 8-22 mins ?$Therapeutic Activity: 8-22 mins          ?          ? ?Katina Dung. Hartnett-Rands, MS, PT ?Per Darryl Lent PT [facility] ?[place] #[id number] ? ?Haniya Fern  Hartnett-Rands ?01/03/2022, 11:09 AM ? ?

## 2022-01-03 NOTE — Discharge Summary (Signed)
?Physician Discharge Summary  ?Patient ID: ?Norma Barton ?MRN: [id number] ?DOB/AGE: 85/05/1935 85 y.o. ? ?Admit date: 01/01/2022 ?Discharge date: 01/03/2022 ? ?Admission Diagnoses: Osteoarthritis of the right knee ? ?Discharge Diagnoses: Same ? ?Discharged Condition: stable ? ?Procedure: Right total knee arthroplasty ? ?Attune fixed-bearing posterior stabilized total knee ? ?Hospital Course:  ?The patient was admitted on 01 January 2022 she had a right total knee she had a postoperative saphenous nerve block and spinal anesthetic ? ?Hospital day 1 and 2 she tolerated therapy well ambulated about 60 to 70 feet and had 80 to 90 degrees of knee flexion ? ? ?Discharge Exam: ?BP (!) 104/59 (BP Location: Right Arm)   Pulse (!) 59   Temp (!) 97.4 ?F (36.3 ?C) (Oral)   Resp 16   Ht 5\' 2"  (1.575 m)   Wt 72 kg   SpO2 99%   BMI 29.03 kg/m?  ?Physical Exam ?She is awake and alert she is oriented x3 ? ?Her right leg is in good condition with normal neurovascular function ? ? ?  Latest Ref Rng & Units 01/03/2022  ?  4:56 AM 01/02/2022  ?  5:13 AM 12/28/2021  ?  1:51 PM  ?CBC  ?WBC 4.0 - 10.5 K/uL 9.2   6.4   5.2    ?Hemoglobin 12.0 - 15.0 g/dL 40.910.9   81.111.4   91.415.0    ?Hematocrit 36.0 - 46.0 % 33.7   36.0   45.7    ?Platelets 150 - 400 K/uL 91   100   127    ? ? ? ?  Latest Ref Rng & Units 01/02/2022  ?  5:13 AM 12/28/2021  ?  1:51 PM 11/02/2021  ? 12:04 PM  ?BMP  ?Glucose 70 - 99 mg/dL 782119   956145   213182    ?BUN 8 - 23 mg/dL 15   17   17     ?Creatinine 0.44 - 1.00 mg/dL 0.860.90   5.780.86   4.691.01    ?BUN/Creat Ratio 12 - 28   17    ?Sodium 135 - 145 mmol/L 139   137   145    ?Potassium 3.5 - 5.1 mmol/L 3.7   3.5   3.7    ?Chloride 98 - 111 mmol/L 107   101   104    ?CO2 22 - 32 mmol/L 26   29   28     ?Calcium 8.9 - 10.3 mg/dL 8.1   9.1   9.7    ? ? ? ? ?Disposition: Discharge disposition: 01-Home or Self Care ? ? ? ? ?Follow-up should be on May 3 ? ?Discharge Instructions   ? ? Call MD / Call 911   Complete by: As directed ?  ? If you  experience chest pain or shortness of breath, CALL 911 and be transported to the hospital emergency room.  If you develope a fever above 101 F, pus (white drainage) or increased drainage or redness at the wound, or calf pain, call your surgeon's office.  ? Constipation Prevention   Complete by: As directed ?  ? Drink plenty of fluids.  Prune juice may be helpful.  You may use a stool softener, such as Colace (over the counter) 100 mg twice a day.  Use MiraLax (over the counter) for constipation as needed.  ? Diet - low sodium heart healthy   Complete by: As directed ?  ? Discharge instructions   Complete by: As directed ?  ? Wound dressing do  not change unless the dressing becomes soiled.  If it does become soiled remove it and apply 4 x 4's and tape ? ?CPM machine if available 6 hours a day started 0 to 80 degrees increase 10 degrees/day as tolerated for total time of 2 weeks ? ?TED stockings wear for 4 weeks ? ?Weightbearing as tolerated ? ?Blue bone foam placed heel in the bone foam 30 minutes 3 times a day  ? Increase activity slowly as tolerated   Complete by: As directed ?  ? Post-operative opioid taper instructions:   Complete by: As directed ?  ? POST-OPERATIVE OPIOID TAPER INSTRUCTIONS: ?It is important to wean off of your opioid medication as soon as possible. If you do not need pain medication after your surgery it is ok to stop day one. ?Opioids include: ?Codeine, Hydrocodone(Norco, Vicodin), Oxycodone(Percocet, oxycontin) and hydromorphone amongst others.  ?Long term and even short term use of opiods can cause: ?Increased pain response ?Dependence ?Constipation ?Depression ?Respiratory depression ?And more.  ?Withdrawal symptoms can include ?Flu like symptoms ?Nausea, vomiting ?And more ?Techniques to manage these symptoms ?Hydrate well ?Eat regular healthy meals ?Stay active ?Use relaxation techniques(deep breathing, meditating, yoga) ?Do Not substitute Alcohol to help with tapering ?If you have been  on opioids for less than two weeks and do not have pain than it is ok to stop all together.  ?Plan to wean off of opioids ?This plan should start within one week post op of your joint replacement. ?Maintain the same interval or time between taking each dose and first decrease the dose.  ?Cut the total daily intake of opioids by one tablet each day ?Next start to increase the time between doses. ?The last dose that should be eliminated is the evening dose.  ? ?  ? ?  ? ?Allergies as of 01/03/2022   ?No Known Allergies ?  ? ?  ?Medication List  ?  ? ?TAKE these medications   ? ?acetaminophen 500 MG tablet ?Commonly known as: TYLENOL ?Take 1,000 mg by mouth every 6 (six) hours as needed for mild pain. ?  ?apixaban 5 MG Tabs tablet ?Commonly known as: ELIQUIS ?Take 1 tablet (5 mg total) by mouth 2 (two) times daily. ?  ?cholecalciferol 25 MCG (1000 UNIT) tablet ?Commonly known as: VITAMIN D ?Take 2,000 Units by mouth in the morning. ?  ?docusate sodium 100 MG capsule ?Commonly known as: COLACE ?Take 1 capsule (100 mg total) by mouth 2 (two) times daily. ?  ?furosemide 20 MG tablet ?Commonly known as: LASIX ?TAKE 1 TABLET BY MOUTH EVERY DAY ?  ?HYDROcodone-acetaminophen 5-325 MG tablet ?Commonly known as: NORCO/VICODIN ?Take 1-2 tablets by mouth every 4 (four) hours as needed for moderate pain (pain score 4-6). ?  ?methocarbamol 500 MG tablet ?Commonly known as: ROBAXIN ?Take 1 tablet (500 mg total) by mouth every 6 (six) hours as needed for muscle spasms. ?  ?metoprolol succinate 25 MG 24 hr tablet ?Commonly known as: TOPROL-XL ?Take 1 tablet (25 mg total) by mouth daily. ?  ?omeprazole 40 MG capsule ?Commonly known as: PRILOSEC ?Take 1 capsule (40 mg total) by mouth daily. ?  ?polyethylene glycol 17 g packet ?Commonly known as: MIRALAX / GLYCOLAX ?Take 17 g by mouth daily as needed for mild constipation. ?  ?potassium chloride SA 20 MEQ tablet ?Commonly known as: KLOR-CON M ?Take 1 tablet (20 mEq total) by mouth  daily. ?  ?traMADol 50 MG tablet ?Commonly known as: ULTRAM ?Take 1 tablet (50 mg total)  by mouth every 6 (six) hours. ?  ?triamcinolone cream 0.1 % ?Commonly known as: KENALOG ?Apply 1 application topically 2 (two) times daily. ?  ? ?  ? ? Contact information for after-discharge care   ? ? Destination   ? ? [facility] Preferred SNF .   ?Service: Skilled Nursing ?Contact information: ?[address] ?[place] [address] ?[phone] ? ?  ?  ? ?  ?  ? ?  ?  ? ?  ? ? ?Signed: ?[name] ?01/03/2022, 2:52 PM ? ? ?

## 2022-01-03 NOTE — TOC Transition Note (Signed)
Transition of Care (TOC) - CM/SW Discharge Note ? ? ?Patient Details  ?Name: Norma Barton ?MRN: [id number] ?Date of Birth: 05-19-1935 ? ?Transition of Care (TOC) CM/SW Contact:  ?Shade Flood, LCSW ?Phone Number: ?01/03/2022, 3:22 PM ? ? ?Clinical Narrative:    ? ?Insurance auth received and pt stable for SNF dc per MD. [facility] at [facility] and they can accept. DC clinical sent electronically. RN to call report. [facility] w/c Lucianne Lei transport arranged. ? ?Final next level of care: [place] ?Barriers to Discharge: Barriers Resolved ? ? ?Patient Goals and CMS Choice ?Patient states their goals for this hospitalization and ongoing recovery are:: go to SNF ?CMS Medicare.gov Compare Post Acute Care list provided to:: Patient ?Choice offered to / list presented to : Patient ? ?Discharge Placement ?  ?           ?Patient [facility] bed at: [facility] ?Patient to be transferred to facility by: [facility] ?Name of family member notified: Harmon Pier ?Patient and family notified of of transfer: 01/03/22 ? ?Discharge Plan and Services ?In-house Referral: Clinical Social Work ?  ?Post Acute Care Choice: [place]          ?  ?  ?  ?  ?  ?  ?  ?  ?  ?  ? ?Social Determinants of Health (SDOH) Interventions ?  ? ? ?Readmission Risk Interventions ?   ? View : No data to display.  ?  ?  ?  ? ? ? ? ? ?

## 2022-01-03 NOTE — Progress Notes (Signed)
Physical Therapy Treatment ?Patient Details ?Name: Norma Barton ?MRN: [id number] ?DOB: [birth year] ?Today's Date: 01/03/2022 ? ? ?RIGHT KNEE ROM:  0 - 91 degrees ?AMBULATION DISTANCE: 70 feet using RW with Min assist ? ?History of Present Illness Norma Barton is a 87 y/o female, s/p Right TKA with the diagnosis of Osteoarthritis right knee. ? ?  ?PT Comments  ? ? Patient demonstrates improvement for moving RLE during bed mobility with less c/o pain, slightly increased right knee flexion while self stretching while seated at EOB and increased endurance/distance for gait training in hallway without loss of balance.  Patient tolerated sitting up in chair after therapy with RLE dangling.  Patient will benefit from continued skilled physical therapy in hospital and recommended venue below to increase strength, balance, endurance for safe ADLs and gait.  ?   ?Recommendations for follow up therapy are one component of a multi-disciplinary discharge planning process, led by the attending physician.  Recommendations may be updated based on patient status, additional functional criteria and insurance authorization. ? ?Follow Up Recommendations ? Skilled nursing-short term rehab (<3 hours/day) ?  ?  ?Assistance Recommended at Discharge Intermittent Supervision/Assistance  ?Patient can return home with the following A little help with walking and/or transfers;A little help with bathing/dressing/bathroom;Assistance with cooking/housework;Help with stairs or ramp for entrance ?  ?Equipment Recommendations ? None recommended by PT  ?  ?Recommendations for Other Services   ? ? ?  ?Precautions / Restrictions Precautions ?Precautions: Fall ?Restrictions ?Weight Bearing Restrictions: Yes ?RLE Weight Bearing: Weight bearing as tolerated  ?  ? ?Mobility ? Bed Mobility ?Overal bed mobility: Needs Assistance ?Bed Mobility: Supine to Sit ?  ?  ?Supine to sit: Modified independent (Device/Increase time), Supervision ?  ?  ?General bed  mobility comments: slightly labored movement with good return for moving RLE ?  ? ?Transfers ?Overall transfer level: Needs assistance ?Equipment used: Rolling walker (2 wheels) ?Transfers: Sit to/from Stand, Bed to chair/wheelchair/BSC ?Sit to Stand: Min guard, Min assist ?  ?Step pivot transfers: Min assist, Min guard ?  ?  ?  ?General transfer comment: increased RLE strength for completing sit to stands/transfers ?  ? ?Ambulation/Gait ?Ambulation/Gait assistance: Min guard, Min assist ?Gait Distance (Feet): 70 Feet ?Assistive device: Rolling walker (2 wheels) ?Gait Pattern/deviations: Decreased step length - right, Decreased step length - left, Decreased stride length, Decreased stance time - right, Antalgic ?Gait velocity: decreased ?  ?  ?General Gait Details: slightly increased endurance/distance for gait training with fair/good return for right heel to toe stepping without loss of balance ? ? ?Stairs ?  ?  ?  ?  ?  ? ? ?Wheelchair Mobility ?  ? ?Modified Rankin (Stroke Patients Only) ?  ? ? ?  ?Balance Overall balance assessment: Needs assistance ?Sitting-balance support: Feet supported, No upper extremity supported ?Sitting balance-Leahy Scale: Good ?Sitting balance - Comments: seated at EOB ?  ?Standing balance support: During functional activity, Bilateral upper extremity supported ?Standing balance-Leahy Scale: Fair ?Standing balance comment: fair/good using RW ?  ?  ?  ?  ?  ?  ?  ?  ?  ?  ?  ?  ? ?  ?Cognition Arousal/Alertness: Awake/alert ?Behavior During Therapy: [facility] for tasks assessed/performed ?Overall Cognitive Status: Within Functional Limits for tasks assessed ?  ?  ?  ?  ?  ?  ?  ?  ?  ?  ?  ?  ?  ?  ?  ?  ?  ?  ?  ? ?  ?  Exercises Total Joint Exercises ?Ankle Circles/Pumps: Supine, Right, 10 reps, AROM ?Quad Sets: Supine, Right, AROM, 10 reps ?Short Arc Quad: Supine, 10 reps, Right, AROM ?Heel Slides: Right, AROM, 10 reps, Supine ?Goniometric ROM: Right knee: 0 - 91 degrees ? ?  ?General  Comments   ?  ?  ? ?Pertinent Vitals/Pain Pain Assessment ?Pain Assessment: Faces ?Faces Pain Scale: Hurts little more ?Pain Location: right knee ?Pain Descriptors / Indicators: Sore, Discomfort ?Pain Intervention(s): Limited activity within patient's tolerance, Monitored during session, Repositioned  ? ? ?Home Living   ?  ?  ?  ?  ?  ?  ?  ?  ?  ?   ?  ?Prior Function    ?  ?  ?   ? ?PT Goals (current goals can now be found in the care plan section) Acute Rehab PT Goals ?Patient Stated Goal: return  home after rehab ?PT Goal Formulation: With patient ?Time For Goal Achievement: 01/15/22 ?Potential to Achieve Goals: Good ?Progress towards PT goals: Progressing toward goals ? ?  ?Frequency ? ? ? BID ? ? ? ?  ?PT Plan Current plan remains appropriate  ? ? ?Co-evaluation   ?  ?  ?  ?  ? ?  ?AM-PAC PT "6 Clicks" Mobility   ?Outcome Measure ? Help needed turning from your back to your side while in a flat bed without using bedrails?: None ?Help needed moving from lying on your back to sitting on the side of a flat bed without using bedrails?: None ?Help needed moving to and from a bed to a chair (including a wheelchair)?: A Little ?Help needed standing up from a chair using your arms (e.g., wheelchair or bedside chair)?: A Little ?Help needed to walk in hospital room?: A Little ?Help needed climbing 3-5 steps with a railing? : A Lot ?6 Click Score: 19 ? ?  ?End of Session   ?Activity Tolerance: Patient tolerated treatment well;Patient limited by fatigue ?Patient left: in chair;with call bell/phone within reach ?Nurse Communication: Mobility status ?PT Visit Diagnosis: Unsteadiness on feet (R26.81);Other abnormalities of gait and mobility (R26.89);Muscle weakness (generalized) (M62.81) ?  ? ? ?Time: 5361-4431 ?PT Time Calculation (min) (ACUTE ONLY): 24 min ? ?Charges:  $Gait Training: 8-22 mins ?$Therapeutic Exercise: 8-22 mins          ?          ? ?3:55 PM, 01/03/22 ?Ocie Bob, MPT ?Physical Therapist with  [facility] ?[phone] office ?[phone] mobile phone ? ? ?

## 2022-01-03 NOTE — TOC Progression Note (Signed)
Transition of Care (TOC) - Progression Note  ? ? ?Patient Details  ?Name: Norma Barton ?MRN: [id number] ?Date of Birth: 1934-12-28 ? ?Transition of Care (TOC) CM/SW Contact  ?Elliot Gault, LCSW ?Phone Number: ?01/03/2022, 2:45 PM ? ?Clinical Narrative:    ? ?Auth received for SNF admission. Updated [facility]. They can accept pt if dc summary complete by 3:30pm. Message sent to Dr. Romeo Apple. Will assist with dc if orders/summary complete in time. ? ?Expected Discharge Plan: Skilled Nursing Facility ?Barriers to Discharge: Continued Medical Work up ? ?Expected Discharge Plan and Services ?Expected Discharge Plan: Skilled Nursing Facility ?In-house Referral: Clinical Social Work ?  ?Post Acute Care Choice: Skilled Nursing Facility ?Living arrangements for the past 2 months: Single Family Home ?                ?  ?  ?  ?  ?  ?  ?  ?  ?  ?  ? ? ?Social Determinants of Health (SDOH) Interventions ?  ? ?Readmission Risk Interventions ?   ? View : No data to display.  ?  ?  ?  ? ? ?

## 2022-01-03 NOTE — Progress Notes (Signed)
Patient ID: Norma Barton, female   DOB: October 09, 1934, 87 y.o.   MRN: [id number] ? ?BP (!) 92/51 (BP Location: Right Arm)   Pulse 60   Temp 98.6 ?F (37 ?C)   Resp 18   Ht 5\' 2"  (1.575 m)   Wt 72 kg   SpO2 95%   BMI 29.03 kg/m?  ? ? ?  Latest Ref Rng & Units 01/03/2022  ?  4:56 AM 01/02/2022  ?  5:13 AM 12/28/2021  ?  1:51 PM  ?CBC  ?WBC 4.0 - 10.5 K/uL 9.2   6.4   5.2    ?Hemoglobin 12.0 - 15.0 g/dL 12/30/2021   44.0   34.7    ?Hematocrit 36.0 - 46.0 % 33.7   36.0   45.7    ?Platelets 150 - 400 K/uL 91   100   127    ? ? ?  Latest Ref Rng & Units 01/02/2022  ?  5:13 AM 12/28/2021  ?  1:51 PM 11/02/2021  ? 12:04 PM  ?BMP  ?Glucose 70 - 99 mg/dL 11/04/2021   956   387    ?BUN 8 - 23 mg/dL 15   17   17     ?Creatinine 0.44 - 1.00 mg/dL 564     3.32    ?BUN/Creat Ratio 12 - 28   17    ?Sodium 135 - 145 mmol/L 139   137   145    ?Potassium 3.5 - 5.1 mmol/L 3.7   3.5   3.7    ?Chloride 98 - 111 mmol/L 107   101   104    ?CO2 22 - 32 mmol/L 26   29   28     ?Calcium 8.9 - 10.3 mg/dL 8.1   9.1   9.7    ? ?Doing well  ?Pain controlled  ?ROM varies 80-90 degrees (passive flexion test 110 at end of surgery) ? ?Dc when bed available  ?

## 2022-01-09 ENCOUNTER — Ambulatory Visit: Payer: Medicare HMO | Admitting: Cardiology

## 2022-01-16 ENCOUNTER — Ambulatory Visit (INDEPENDENT_AMBULATORY_CARE_PROVIDER_SITE_OTHER): Payer: Medicare HMO | Admitting: Orthopedic Surgery

## 2022-01-16 ENCOUNTER — Encounter: Payer: Self-pay | Admitting: Orthopedic Surgery

## 2022-01-16 DIAGNOSIS — Z96651 Presence of right artificial knee joint: Secondary | ICD-10-CM

## 2022-01-16 NOTE — Progress Notes (Signed)
FOLLOW UP  ? ?Encounter Diagnosis  ?Name Primary?  ? Status post total right knee replacement 01/01/22/ Yes  ? ? ? ?Chief Complaint  ?Patient presents with  ? Post-op Follow-up  ?  Right TKS DOS 01/01/22  ? ? ? ?Second postop week status post right total knee.  She is at [place] home.  She had her staples removed.  Her wound looks good.  Steri-Strips were applied. ? ?She can be discharged on Friday according to the assistants who are with her today ? ?She can have home health PT and set up an appointment with me for 3 weeks ?

## 2022-01-16 NOTE — Patient Instructions (Signed)
Follow up in 4 weeks s/p TKR RT ? ?OK to discharge from Rehab Facility on 01/18/22 ? ?Wrap knee in seran wrap when showering ?

## 2022-01-21 ENCOUNTER — Telehealth: Payer: Self-pay

## 2022-01-21 ENCOUNTER — Ambulatory Visit: Payer: Medicare HMO | Admitting: Nurse Practitioner

## 2022-01-21 DIAGNOSIS — E876 Hypokalemia: Secondary | ICD-10-CM | POA: Diagnosis not present

## 2022-01-21 DIAGNOSIS — Z7901 Long term (current) use of anticoagulants: Secondary | ICD-10-CM | POA: Diagnosis not present

## 2022-01-21 DIAGNOSIS — Z8673 Personal history of transient ischemic attack (TIA), and cerebral infarction without residual deficits: Secondary | ICD-10-CM | POA: Diagnosis not present

## 2022-01-21 DIAGNOSIS — I480 Paroxysmal atrial fibrillation: Secondary | ICD-10-CM | POA: Diagnosis not present

## 2022-01-21 DIAGNOSIS — E1136 Type 2 diabetes mellitus with diabetic cataract: Secondary | ICD-10-CM | POA: Diagnosis not present

## 2022-01-21 DIAGNOSIS — G4700 Insomnia, unspecified: Secondary | ICD-10-CM | POA: Diagnosis not present

## 2022-01-21 DIAGNOSIS — K219 Gastro-esophageal reflux disease without esophagitis: Secondary | ICD-10-CM | POA: Diagnosis not present

## 2022-01-21 DIAGNOSIS — K5909 Other constipation: Secondary | ICD-10-CM | POA: Diagnosis not present

## 2022-01-21 DIAGNOSIS — R32 Unspecified urinary incontinence: Secondary | ICD-10-CM | POA: Diagnosis not present

## 2022-01-21 DIAGNOSIS — Z471 Aftercare following joint replacement surgery: Secondary | ICD-10-CM | POA: Diagnosis not present

## 2022-01-21 DIAGNOSIS — I10 Essential (primary) hypertension: Secondary | ICD-10-CM | POA: Diagnosis not present

## 2022-01-21 DIAGNOSIS — Z96653 Presence of artificial knee joint, bilateral: Secondary | ICD-10-CM | POA: Diagnosis not present

## 2022-01-21 DIAGNOSIS — Z90710 Acquired absence of both cervix and uterus: Secondary | ICD-10-CM | POA: Diagnosis not present

## 2022-01-21 NOTE — Telephone Encounter (Signed)
Transition Care Management Follow-up Telephone Call ?Date of discharge and from where: 01/18/22 - [facility] - rehab post right TKR ?How have you been since you were released from the hospital? Still very sore, but getting around better ?Any questions or concerns? No ? ?Items Reviewed: ?Did the pt receive and understand the discharge instructions provided? Yes  ?Medications obtained and verified? Yes  ?Other? No  ?Any new allergies since your discharge? No  ?Dietary orders reviewed? Yes ?Do you have support at home? Yes  ? ?Home Care and Equipment/Supplies: ?Were home health services ordered? yes ?If so, what is the name of the agency? [facility]  ?Has the agency set up a time to come to the patient's home? yes ?Were any new equipment or medical supplies ordered?  No ? ?Functional Questionnaire: (I = Independent and D = Dependent) ?ADLs: I ? ?Bathing/Dressing- I ? ?Meal Prep- I ? ?Eating- I ? ?Maintaining continence- I ? ?Transferring/Ambulation- I ? ?Managing Meds- I ? ?Follow up appointments reviewed: ? ?PCP Hospital f/u appt confirmed? Yes  Scheduled to see Gennette Pac on 01/29/22 @ 2:15. ?Specialist Hospital f/u appt confirmed? Yes  Scheduled to see Romeo Apple, ortho on 01/2422 @ 11:30. ?Are transportation arrangements needed? No  ?If their condition worsens, is the pt aware to call PCP or go to the Emergency Dept.? Yes ?Was the patient provided with contact information for the PCP's office or ED? Yes ?Was to pt encouraged to call back with questions or concerns? Yes  ?

## 2022-01-23 DIAGNOSIS — Z8673 Personal history of transient ischemic attack (TIA), and cerebral infarction without residual deficits: Secondary | ICD-10-CM | POA: Diagnosis not present

## 2022-01-23 DIAGNOSIS — K219 Gastro-esophageal reflux disease without esophagitis: Secondary | ICD-10-CM | POA: Diagnosis not present

## 2022-01-23 DIAGNOSIS — Z7901 Long term (current) use of anticoagulants: Secondary | ICD-10-CM | POA: Diagnosis not present

## 2022-01-23 DIAGNOSIS — E1136 Type 2 diabetes mellitus with diabetic cataract: Secondary | ICD-10-CM | POA: Diagnosis not present

## 2022-01-23 DIAGNOSIS — I10 Essential (primary) hypertension: Secondary | ICD-10-CM | POA: Diagnosis not present

## 2022-01-23 DIAGNOSIS — I480 Paroxysmal atrial fibrillation: Secondary | ICD-10-CM | POA: Diagnosis not present

## 2022-01-23 DIAGNOSIS — Z471 Aftercare following joint replacement surgery: Secondary | ICD-10-CM | POA: Diagnosis not present

## 2022-01-23 DIAGNOSIS — G4700 Insomnia, unspecified: Secondary | ICD-10-CM | POA: Diagnosis not present

## 2022-01-23 DIAGNOSIS — K5909 Other constipation: Secondary | ICD-10-CM | POA: Diagnosis not present

## 2022-01-23 DIAGNOSIS — E876 Hypokalemia: Secondary | ICD-10-CM | POA: Diagnosis not present

## 2022-01-23 DIAGNOSIS — R32 Unspecified urinary incontinence: Secondary | ICD-10-CM | POA: Diagnosis not present

## 2022-01-23 DIAGNOSIS — Z90710 Acquired absence of both cervix and uterus: Secondary | ICD-10-CM | POA: Diagnosis not present

## 2022-01-23 DIAGNOSIS — Z96653 Presence of artificial knee joint, bilateral: Secondary | ICD-10-CM | POA: Diagnosis not present

## 2022-01-24 ENCOUNTER — Telehealth: Payer: Self-pay | Admitting: Nurse Practitioner

## 2022-01-24 DIAGNOSIS — E1136 Type 2 diabetes mellitus with diabetic cataract: Secondary | ICD-10-CM | POA: Diagnosis not present

## 2022-01-24 DIAGNOSIS — I10 Essential (primary) hypertension: Secondary | ICD-10-CM | POA: Diagnosis not present

## 2022-01-24 DIAGNOSIS — Z7901 Long term (current) use of anticoagulants: Secondary | ICD-10-CM | POA: Diagnosis not present

## 2022-01-24 DIAGNOSIS — I480 Paroxysmal atrial fibrillation: Secondary | ICD-10-CM | POA: Diagnosis not present

## 2022-01-24 DIAGNOSIS — E876 Hypokalemia: Secondary | ICD-10-CM | POA: Diagnosis not present

## 2022-01-24 DIAGNOSIS — Z8673 Personal history of transient ischemic attack (TIA), and cerebral infarction without residual deficits: Secondary | ICD-10-CM | POA: Diagnosis not present

## 2022-01-24 DIAGNOSIS — G4700 Insomnia, unspecified: Secondary | ICD-10-CM | POA: Diagnosis not present

## 2022-01-24 DIAGNOSIS — R32 Unspecified urinary incontinence: Secondary | ICD-10-CM | POA: Diagnosis not present

## 2022-01-24 DIAGNOSIS — Z471 Aftercare following joint replacement surgery: Secondary | ICD-10-CM | POA: Diagnosis not present

## 2022-01-24 DIAGNOSIS — Z96653 Presence of artificial knee joint, bilateral: Secondary | ICD-10-CM | POA: Diagnosis not present

## 2022-01-24 DIAGNOSIS — K219 Gastro-esophageal reflux disease without esophagitis: Secondary | ICD-10-CM | POA: Diagnosis not present

## 2022-01-24 DIAGNOSIS — K5909 Other constipation: Secondary | ICD-10-CM | POA: Diagnosis not present

## 2022-01-24 DIAGNOSIS — Z90710 Acquired absence of both cervix and uterus: Secondary | ICD-10-CM | POA: Diagnosis not present

## 2022-01-25 DIAGNOSIS — E1136 Type 2 diabetes mellitus with diabetic cataract: Secondary | ICD-10-CM | POA: Diagnosis not present

## 2022-01-25 DIAGNOSIS — I10 Essential (primary) hypertension: Secondary | ICD-10-CM | POA: Diagnosis not present

## 2022-01-25 DIAGNOSIS — Z96653 Presence of artificial knee joint, bilateral: Secondary | ICD-10-CM | POA: Diagnosis not present

## 2022-01-25 DIAGNOSIS — Z7901 Long term (current) use of anticoagulants: Secondary | ICD-10-CM | POA: Diagnosis not present

## 2022-01-25 DIAGNOSIS — G4700 Insomnia, unspecified: Secondary | ICD-10-CM | POA: Diagnosis not present

## 2022-01-25 DIAGNOSIS — I480 Paroxysmal atrial fibrillation: Secondary | ICD-10-CM | POA: Diagnosis not present

## 2022-01-25 DIAGNOSIS — R32 Unspecified urinary incontinence: Secondary | ICD-10-CM | POA: Diagnosis not present

## 2022-01-25 DIAGNOSIS — Z90710 Acquired absence of both cervix and uterus: Secondary | ICD-10-CM | POA: Diagnosis not present

## 2022-01-25 DIAGNOSIS — K219 Gastro-esophageal reflux disease without esophagitis: Secondary | ICD-10-CM | POA: Diagnosis not present

## 2022-01-25 DIAGNOSIS — Z8673 Personal history of transient ischemic attack (TIA), and cerebral infarction without residual deficits: Secondary | ICD-10-CM | POA: Diagnosis not present

## 2022-01-25 DIAGNOSIS — K5909 Other constipation: Secondary | ICD-10-CM | POA: Diagnosis not present

## 2022-01-25 DIAGNOSIS — E876 Hypokalemia: Secondary | ICD-10-CM | POA: Diagnosis not present

## 2022-01-25 DIAGNOSIS — Z471 Aftercare following joint replacement surgery: Secondary | ICD-10-CM | POA: Diagnosis not present

## 2022-01-28 ENCOUNTER — Telehealth: Payer: Medicare HMO

## 2022-01-28 DIAGNOSIS — Z7901 Long term (current) use of anticoagulants: Secondary | ICD-10-CM | POA: Diagnosis not present

## 2022-01-28 DIAGNOSIS — G4700 Insomnia, unspecified: Secondary | ICD-10-CM | POA: Diagnosis not present

## 2022-01-28 DIAGNOSIS — Z90710 Acquired absence of both cervix and uterus: Secondary | ICD-10-CM | POA: Diagnosis not present

## 2022-01-28 DIAGNOSIS — Z471 Aftercare following joint replacement surgery: Secondary | ICD-10-CM | POA: Diagnosis not present

## 2022-01-28 DIAGNOSIS — I480 Paroxysmal atrial fibrillation: Secondary | ICD-10-CM | POA: Diagnosis not present

## 2022-01-28 DIAGNOSIS — I10 Essential (primary) hypertension: Secondary | ICD-10-CM | POA: Diagnosis not present

## 2022-01-28 DIAGNOSIS — K5909 Other constipation: Secondary | ICD-10-CM | POA: Diagnosis not present

## 2022-01-28 DIAGNOSIS — Z8673 Personal history of transient ischemic attack (TIA), and cerebral infarction without residual deficits: Secondary | ICD-10-CM | POA: Diagnosis not present

## 2022-01-28 DIAGNOSIS — E876 Hypokalemia: Secondary | ICD-10-CM | POA: Diagnosis not present

## 2022-01-28 DIAGNOSIS — K219 Gastro-esophageal reflux disease without esophagitis: Secondary | ICD-10-CM | POA: Diagnosis not present

## 2022-01-28 DIAGNOSIS — Z96653 Presence of artificial knee joint, bilateral: Secondary | ICD-10-CM | POA: Diagnosis not present

## 2022-01-28 DIAGNOSIS — E1136 Type 2 diabetes mellitus with diabetic cataract: Secondary | ICD-10-CM | POA: Diagnosis not present

## 2022-01-28 DIAGNOSIS — R32 Unspecified urinary incontinence: Secondary | ICD-10-CM | POA: Diagnosis not present

## 2022-01-29 ENCOUNTER — Encounter: Payer: Self-pay | Admitting: Nurse Practitioner

## 2022-01-29 ENCOUNTER — Other Ambulatory Visit: Payer: Self-pay

## 2022-01-29 ENCOUNTER — Ambulatory Visit (INDEPENDENT_AMBULATORY_CARE_PROVIDER_SITE_OTHER): Payer: Medicare HMO | Admitting: Nurse Practitioner

## 2022-01-29 ENCOUNTER — Telehealth: Payer: Self-pay | Admitting: Orthopedic Surgery

## 2022-01-29 VITALS — BP 107/66 | HR 94 | Temp 98.1°F | Resp 20 | Ht 62.0 in | Wt 156.0 lb

## 2022-01-29 DIAGNOSIS — Z7689 Persons encountering health services in other specified circumstances: Secondary | ICD-10-CM | POA: Diagnosis not present

## 2022-01-29 DIAGNOSIS — Z96651 Presence of right artificial knee joint: Secondary | ICD-10-CM | POA: Diagnosis not present

## 2022-01-29 MED ORDER — HYDROCODONE-ACETAMINOPHEN 5-325 MG PO TABS: 1.0000 | ORAL_TABLET | ORAL | 0 refills | Status: DC | PRN

## 2022-01-29 NOTE — Patient Instructions (Signed)
Acute Knee Pain, Adult Many things can cause knee pain. Sometimes, knee pain is sudden (acute) and may be caused by damage, swelling, or irritation of the muscles and tissues that support your knee. The pain often goes away on its own with time and rest. If the pain does not go away, tests may be done to find out what is causing the pain. Follow these instructions at home: If you have a knee sleeve or brace:  Wear the knee sleeve or brace as told by your doctor. Take it off only as told by your doctor. Loosen it if your toes: Tingle. Become numb. Turn cold and blue. Keep it clean. If the knee sleeve or brace is not waterproof: Do not let it get wet. Cover it with a watertight covering when you take a bath or shower. Activity Rest your knee. Do not do things that cause pain or make pain worse. Avoid activities where both feet leave the ground at the same time (high-impact activities). Examples are running, jumping rope, and doing jumping jacks. Work with a physical therapist to make a safe exercise program, as told by your doctor. Managing pain, stiffness, and swelling  If told, put ice on the knee. To do this: If you have a removable knee sleeve or brace, take it off as told by your doctor. Put ice in a plastic bag. Place a towel between your skin and the bag. Leave the ice on for 20 minutes, 2-3 times a day. Take off the ice if your skin turns bright red. This is very important. If you cannot feel pain, heat, or cold, you have a greater risk of damage to the area. If told, use an elastic bandage to put pressure (compression) on your injured knee. Raise your knee above the level of your heart while you are sitting or lying down. Sleep with a pillow under your knee. General instructions Take over-the-counter and prescription medicines only as told by your doctor. Do not smoke or use any products that contain nicotine or tobacco. If you need help quitting, ask your doctor. If you are  overweight, work with your doctor and a food expert (dietitian) to set goals to lose weight. Being overweight can make your knee hurt more. Watch for any changes in your symptoms. Keep all follow-up visits. Contact a doctor if: The knee pain does not stop. The knee pain changes or gets worse. You have a fever along with knee pain. Your knee is red or feels warm when you touch it. Your knee gives out or locks up. Get help right away if: Your knee swells, and the swelling gets worse. You cannot move your knee. You have very bad knee pain that does not get better with pain medicine. Summary Many things can cause knee pain. The pain often goes away on its own with time and rest. Your doctor may do tests to find out the cause of the pain. Watch for any changes in your symptoms. Relieve your pain with rest, medicines, light activity, and use of ice. Get help right away if you cannot move your knee or your knee pain is very bad. This information is not intended to replace advice given to you by your health care provider. Make sure you discuss any questions you have with your health care provider. Document Revised: 02/16/2020 Document Reviewed: 02/16/2020 Elsevier Patient Education  2023 Elsevier Inc.  

## 2022-01-29 NOTE — Telephone Encounter (Signed)
Request sent to provider.

## 2022-01-29 NOTE — Telephone Encounter (Signed)
Patient called for refill: ?HYDROcodone-acetaminophen (NORCO/VICODIN) 5-325 MG tablet 30 tablet  ?            [facility] in [place] ?

## 2022-01-29 NOTE — Progress Notes (Signed)
? ?Subjective:  ? ? Patient ID: Norma Barton, female    DOB: 20-Mar-1935, 87 y.o.   MRN: [id number] ? ?Today's visit was for Transitional Care Management. ? ?The patient was discharged from [facility] SN[facility] on 01/18/22 with a primary diagnosis of rehab for TKR.  ? ?Contact with the patient and/or caregiver, by a clinical staff member, was made on 01/21/22 and was documented as a telephone encounter within the EMR. ? ?Through chart review and discussion with the patient I have determined that management of their condition is of moderate complexity.  ? ? ?Patient had a TKR on 01/01/22. She was in rehab for a  month. She has been doing well. She is walking well and says that her pain level is 1-2/10 now. She is still walking with a cane. She is still not able to bend it as far as they need it to. She has follow up with her surgeon next week.  ? ? ? ? ?Review of Systems  ?Constitutional:  Negative for diaphoresis.  ?Eyes:  Negative for pain.  ?Respiratory:  Negative for shortness of breath.   ?Cardiovascular:  Negative for chest pain, palpitations and leg swelling.  ?Gastrointestinal:  Negative for abdominal pain.  ?Endocrine: Negative for polydipsia.  ?Skin:  Negative for rash.  ?Neurological:  Negative for dizziness, weakness and headaches.  ?Hematological:  Does not bruise/bleed easily.  ?All other systems reviewed and are negative. ? ?   ?Objective:  ? Physical Exam ?Vitals and nursing note reviewed.  ?Constitutional:   ?   General: She is not in acute distress. ?   Appearance: Normal appearance. She is well-developed.  ?Neck:  ?   Vascular: No carotid bruit or JVD.  ?Cardiovascular:  ?   Rate and Rhythm: Normal rate and regular rhythm.  ?   Heart sounds: Normal heart sounds.  ?Pulmonary:  ?   Effort: Pulmonary effort is normal. No respiratory distress.  ?   Breath sounds: Normal breath sounds. No wheezing or rales.  ?Chest:  ?   Chest wall: No tenderness.  ?Abdominal:  ?   General: Bowel sounds are normal. There is no  distension or abdominal bruit.  ?   Palpations: Abdomen is soft. There is no hepatomegaly, splenomegaly, mass or pulsatile mass.  ?   Tenderness: There is no abdominal tenderness.  ?Musculoskeletal:     ?   General: Normal range of motion.  ?   Cervical back: Normal range of motion and neck supple.  ?Lymphadenopathy:  ?   Cervical: No cervical adenopathy.  ?Skin: ?   General: Skin is warm and dry.  ?   Comments: Right knee incision- edges are well approximated- no erythema or signs of infection  ?Neurological:  ?   Mental Status: She is alert and oriented to person, place, and time.  ?   Deep Tendon Reflexes: Reflexes are normal and symmetric.  ?Psychiatric:     ?   Behavior: Behavior normal.     ?   Thought Content: Thought content normal.     ?   Judgment: Judgment normal.  ? ? ? ?BP 107/66   Pulse 94   Temp 98.1 ?F (36.7 ?C) (Temporal)   Resp 20   Ht 5\' 2"  (1.575 m)   Wt 156 lb (70.8 kg)   SpO2 97%   BMI 28.53 kg/m?  ? ? ?   ?Assessment & Plan:  ? ? Schickling in today with chief complaint of Transiton of care ? ? ?1.  Encounter for support and coordination of transition of care ?Hospital records reviewed ? ?2. Status post right knee replacement ?Keep follow up with surgeon ?Continue ice packs bid ? ? ?The above assessment and management plan was discussed with the patient. The patient verbalized understanding of and has agreed to the management plan. Patient is aware to call the clinic if symptoms persist or worsen. Patient is aware when to return to the clinic for a follow-up visit. Patient educated on when it is appropriate to go to the emergency department.  ? ?Mary-Margaret Daphine Deutscher, FNP ? ? ?

## 2022-01-30 DIAGNOSIS — Z471 Aftercare following joint replacement surgery: Secondary | ICD-10-CM | POA: Diagnosis not present

## 2022-01-30 DIAGNOSIS — E1136 Type 2 diabetes mellitus with diabetic cataract: Secondary | ICD-10-CM | POA: Diagnosis not present

## 2022-01-30 DIAGNOSIS — K5909 Other constipation: Secondary | ICD-10-CM | POA: Diagnosis not present

## 2022-01-30 DIAGNOSIS — E876 Hypokalemia: Secondary | ICD-10-CM | POA: Diagnosis not present

## 2022-01-30 DIAGNOSIS — Z96653 Presence of artificial knee joint, bilateral: Secondary | ICD-10-CM | POA: Diagnosis not present

## 2022-01-30 DIAGNOSIS — Z90710 Acquired absence of both cervix and uterus: Secondary | ICD-10-CM | POA: Diagnosis not present

## 2022-01-30 DIAGNOSIS — I10 Essential (primary) hypertension: Secondary | ICD-10-CM | POA: Diagnosis not present

## 2022-01-30 DIAGNOSIS — K219 Gastro-esophageal reflux disease without esophagitis: Secondary | ICD-10-CM | POA: Diagnosis not present

## 2022-01-30 DIAGNOSIS — Z8673 Personal history of transient ischemic attack (TIA), and cerebral infarction without residual deficits: Secondary | ICD-10-CM | POA: Diagnosis not present

## 2022-01-30 DIAGNOSIS — Z7901 Long term (current) use of anticoagulants: Secondary | ICD-10-CM | POA: Diagnosis not present

## 2022-01-30 DIAGNOSIS — I480 Paroxysmal atrial fibrillation: Secondary | ICD-10-CM | POA: Diagnosis not present

## 2022-01-30 DIAGNOSIS — R32 Unspecified urinary incontinence: Secondary | ICD-10-CM | POA: Diagnosis not present

## 2022-01-30 DIAGNOSIS — G4700 Insomnia, unspecified: Secondary | ICD-10-CM | POA: Diagnosis not present

## 2022-02-01 ENCOUNTER — Telehealth: Payer: Self-pay | Admitting: Radiology

## 2022-02-01 ENCOUNTER — Other Ambulatory Visit: Payer: Self-pay | Admitting: Nurse Practitioner

## 2022-02-01 DIAGNOSIS — K219 Gastro-esophageal reflux disease without esophagitis: Secondary | ICD-10-CM

## 2022-02-01 NOTE — Telephone Encounter (Signed)
Jeannett Senior called to let us know that patient refused her HHPT visit today.  Said she was not feeling well.

## 2022-02-05 DIAGNOSIS — Z96653 Presence of artificial knee joint, bilateral: Secondary | ICD-10-CM | POA: Diagnosis not present

## 2022-02-05 DIAGNOSIS — I10 Essential (primary) hypertension: Secondary | ICD-10-CM | POA: Diagnosis not present

## 2022-02-05 DIAGNOSIS — K5909 Other constipation: Secondary | ICD-10-CM | POA: Diagnosis not present

## 2022-02-05 DIAGNOSIS — Z471 Aftercare following joint replacement surgery: Secondary | ICD-10-CM | POA: Diagnosis not present

## 2022-02-05 DIAGNOSIS — R32 Unspecified urinary incontinence: Secondary | ICD-10-CM | POA: Diagnosis not present

## 2022-02-05 DIAGNOSIS — G4700 Insomnia, unspecified: Secondary | ICD-10-CM | POA: Diagnosis not present

## 2022-02-05 DIAGNOSIS — Z8673 Personal history of transient ischemic attack (TIA), and cerebral infarction without residual deficits: Secondary | ICD-10-CM | POA: Diagnosis not present

## 2022-02-05 DIAGNOSIS — E876 Hypokalemia: Secondary | ICD-10-CM | POA: Diagnosis not present

## 2022-02-05 DIAGNOSIS — Z7901 Long term (current) use of anticoagulants: Secondary | ICD-10-CM | POA: Diagnosis not present

## 2022-02-05 DIAGNOSIS — Z90710 Acquired absence of both cervix and uterus: Secondary | ICD-10-CM | POA: Diagnosis not present

## 2022-02-05 DIAGNOSIS — I480 Paroxysmal atrial fibrillation: Secondary | ICD-10-CM | POA: Diagnosis not present

## 2022-02-05 DIAGNOSIS — E1136 Type 2 diabetes mellitus with diabetic cataract: Secondary | ICD-10-CM | POA: Diagnosis not present

## 2022-02-05 DIAGNOSIS — K219 Gastro-esophageal reflux disease without esophagitis: Secondary | ICD-10-CM | POA: Diagnosis not present

## 2022-02-06 ENCOUNTER — Ambulatory Visit (INDEPENDENT_AMBULATORY_CARE_PROVIDER_SITE_OTHER): Payer: Medicare HMO | Admitting: Orthopedic Surgery

## 2022-02-06 DIAGNOSIS — Z96651 Presence of right artificial knee joint: Secondary | ICD-10-CM

## 2022-02-06 NOTE — Addendum Note (Signed)
Addended byCaffie Damme on: 02/06/2022 11:55 AM   Modules accepted: Orders

## 2022-02-06 NOTE — Patient Instructions (Signed)
Call [facility] for the Physical therapy appointment [phone]

## 2022-02-06 NOTE — Progress Notes (Signed)
Chief Complaint  Patient presents with   Follow-up    Recheck on right knee, DOS 01-01-22.   Encounter Diagnosis  Name Primary?   Status post total right knee replacement 01/01/22/ Yes   36 DAYS POST OP   Patient is ambulatory now with a cane looks good walking.  Range of motion 0-95.  She has 2 more home therapy visits and then convert to outpatient medicine if she can get a ride if not she will do exercises at home   F/U in 7 weeks

## 2022-02-07 ENCOUNTER — Other Ambulatory Visit: Payer: Self-pay

## 2022-02-07 MED ORDER — TRAMADOL HCL 50 MG PO TABS: 50.0000 mg | ORAL_TABLET | Freq: Four times a day (QID) | ORAL | 0 refills | Status: DC

## 2022-02-07 NOTE — Telephone Encounter (Signed)
Tramadol 50 MG  Qty 30 Tablets  Take 1 tablet (50 MG total) by mouth every 6 (six) hours as needed for pain.  PATIENT USES [facility]

## 2022-02-09 ENCOUNTER — Other Ambulatory Visit: Payer: Self-pay | Admitting: Nurse Practitioner

## 2022-02-09 DIAGNOSIS — I480 Paroxysmal atrial fibrillation: Secondary | ICD-10-CM

## 2022-02-13 DIAGNOSIS — G4700 Insomnia, unspecified: Secondary | ICD-10-CM | POA: Diagnosis not present

## 2022-02-13 DIAGNOSIS — I480 Paroxysmal atrial fibrillation: Secondary | ICD-10-CM | POA: Diagnosis not present

## 2022-02-13 DIAGNOSIS — E1136 Type 2 diabetes mellitus with diabetic cataract: Secondary | ICD-10-CM | POA: Diagnosis not present

## 2022-02-13 DIAGNOSIS — R32 Unspecified urinary incontinence: Secondary | ICD-10-CM | POA: Diagnosis not present

## 2022-02-13 DIAGNOSIS — K219 Gastro-esophageal reflux disease without esophagitis: Secondary | ICD-10-CM | POA: Diagnosis not present

## 2022-02-13 DIAGNOSIS — K5909 Other constipation: Secondary | ICD-10-CM | POA: Diagnosis not present

## 2022-02-13 DIAGNOSIS — Z8673 Personal history of transient ischemic attack (TIA), and cerebral infarction without residual deficits: Secondary | ICD-10-CM | POA: Diagnosis not present

## 2022-02-13 DIAGNOSIS — Z90710 Acquired absence of both cervix and uterus: Secondary | ICD-10-CM | POA: Diagnosis not present

## 2022-02-13 DIAGNOSIS — Z7901 Long term (current) use of anticoagulants: Secondary | ICD-10-CM | POA: Diagnosis not present

## 2022-02-13 DIAGNOSIS — E876 Hypokalemia: Secondary | ICD-10-CM | POA: Diagnosis not present

## 2022-02-13 DIAGNOSIS — Z96653 Presence of artificial knee joint, bilateral: Secondary | ICD-10-CM | POA: Diagnosis not present

## 2022-02-13 DIAGNOSIS — Z471 Aftercare following joint replacement surgery: Secondary | ICD-10-CM | POA: Diagnosis not present

## 2022-02-13 DIAGNOSIS — I10 Essential (primary) hypertension: Secondary | ICD-10-CM | POA: Diagnosis not present

## 2022-02-21 ENCOUNTER — Other Ambulatory Visit: Payer: Self-pay

## 2022-02-21 MED ORDER — TRAMADOL HCL 50 MG PO TABS: 50.0000 mg | ORAL_TABLET | Freq: Four times a day (QID) | ORAL | 0 refills | Status: DC

## 2022-03-26 DIAGNOSIS — Z96651 Presence of right artificial knee joint: Secondary | ICD-10-CM | POA: Insufficient documentation

## 2022-03-27 ENCOUNTER — Ambulatory Visit: Payer: Self-pay | Admitting: *Deleted

## 2022-03-27 ENCOUNTER — Ambulatory Visit (INDEPENDENT_AMBULATORY_CARE_PROVIDER_SITE_OTHER): Payer: Medicare HMO | Admitting: Orthopedic Surgery

## 2022-03-27 DIAGNOSIS — I10 Essential (primary) hypertension: Secondary | ICD-10-CM

## 2022-03-27 DIAGNOSIS — E119 Type 2 diabetes mellitus without complications: Secondary | ICD-10-CM

## 2022-03-27 DIAGNOSIS — Z96651 Presence of right artificial knee joint: Secondary | ICD-10-CM

## 2022-03-27 NOTE — Patient Instructions (Signed)
Kathline Magic  I have enjoyed working with you through the Chronic Care Management Program at [facility]. Due to program changes I am removing myself from your care team because you've either met our goals, your conditions are stable and no longer require care management, or we haven't engaged within the past 6 months. If you are currently active with another CCM Team Member, you will remain active with them unless they reach out to you with additional information. If you feel that you need RN Care Management services in the future, please talk with your primary care provider to discuss re-engagement with the RN Care Manager that will be assigned to [facility]. This does not affect your status as a patient at [facility].   Thank you for allowing me to participate in your your healthcare journey.  Chong Sicilian, BSN, RN-BC Embedded Chronic Care Manager [facility] [place] Management Direct Dial: [phone]

## 2022-03-27 NOTE — Chronic Care Management (AMB) (Signed)
  Chronic Care Management   Note  03/27/2022 Name: Norma Barton MRN: [id number] DOB: 1935/08/03   Patient is stable from RN Care Management perspective or has not recently engaged with the RN Care Manager. I am removing RN Care Manager from Care Team and closing RN Care Management Care Plans. If patient is currently engaged with another CCM team member I will forward this encounter to inform them of my case closure. Patient may be eligible for re-engagement with RN Care Manager in the future if necessary and can discuss this with their PCP.  Demetrios Loll, BSN, RN-BC Embedded Chronic Care Manager [facility] / [facility] Care Management Direct Dial: [phone]

## 2022-03-27 NOTE — Progress Notes (Signed)
Chief Complaint  Patient presents with   Routine Post Op    TKR RT/DOS 01/01/22 Still has some pain but nowhere near like it was prior to surgery   Norma Barton is 3 months out from her right total knee she is doing well progressing now to just using a cane when she is out of the house.  No supportive devices in the house.  Her range of motion is now 0-1 10  Recommend follow-up in 3 months

## 2022-03-29 ENCOUNTER — Encounter: Payer: Self-pay | Admitting: Cardiology

## 2022-03-29 ENCOUNTER — Other Ambulatory Visit: Payer: Medicare HMO

## 2022-03-29 ENCOUNTER — Ambulatory Visit: Payer: Medicare HMO | Admitting: Cardiology

## 2022-03-29 VITALS — BP 114/70 | HR 79 | Ht 62.0 in | Wt 158.0 lb

## 2022-03-29 DIAGNOSIS — D696 Thrombocytopenia, unspecified: Secondary | ICD-10-CM

## 2022-03-29 DIAGNOSIS — I4892 Unspecified atrial flutter: Secondary | ICD-10-CM | POA: Diagnosis not present

## 2022-03-29 NOTE — Patient Instructions (Addendum)
Medication Instructions:  Continue all current medications.  Labwork: CBC - order given today  Office will contact with results via phone, letter or mychart.     Testing/Procedures: none  Follow-Up: 6 months   Any Other Special Instructions Will Be Listed Below (If Applicable).   If you need a refill on your cardiac medications before your next appointment, please call your pharmacy.

## 2022-03-29 NOTE — Progress Notes (Signed)
Clinical Summary Norma Barton is a 87 y.o.female seen today for follow up of the following medical problems.   1.Aflutter,paroxysmal - new diagnosis, noted during 03/2021 preop evaluation for knee replacement - has been paroxysmal  - no recent palpitations  - compliant with meds - no bleeding issues on eliquis    Past Medical History:  Diagnosis Date   Arthritis    Diabetes mellitus without complication (HCC)    GERD (gastroesophageal reflux disease)    Hypertension      No Known Allergies   Current Outpatient Medications  Medication Sig Dispense Refill   acetaminophen (TYLENOL) 500 MG tablet Take 1,000 mg by mouth every 6 (six) hours as needed for mild pain.     apixaban (ELIQUIS) 5 MG TABS tablet Take 1 tablet (5 mg total) by mouth 2 (two) times daily. 60 tablet 6   cholecalciferol (VITAMIN D) 25 MCG (1000 UNIT) tablet Take 2,000 Units by mouth in the morning.     docusate sodium (COLACE) 100 MG capsule Take 1 capsule (100 mg total) by mouth 2 (two) times daily. 10 capsule 0   furosemide (LASIX) 20 MG tablet TAKE 1 TABLET BY MOUTH EVERY DAY (Patient taking differently: Take 20 mg by mouth daily.) 90 tablet 1   metoprolol succinate (TOPROL-XL) 25 MG 24 hr tablet Take 1 tablet (25 mg total) by mouth daily. 90 tablet 1   omeprazole (PRILOSEC) 40 MG capsule TAKE 1 CAPSULE (40 MG TOTAL) BY MOUTH DAILY. 90 capsule 0   polyethylene glycol (MIRALAX / GLYCOLAX) 17 g packet Take 17 g by mouth daily as needed for mild constipation. 14 each 0   potassium chloride SA (KLOR-CON M) 20 MEQ tablet Take 1 tablet (20 mEq total) by mouth daily. 90 tablet 1   traMADol (ULTRAM) 50 MG tablet Take 1 tablet (50 mg total) by mouth every 6 (six) hours. 30 tablet 0   triamcinolone cream (KENALOG) 0.1 % Apply 1 application topically 2 (two) times daily. 45 g 2   No current facility-administered medications for this visit.     Past Surgical History:  Procedure Laterality Date   ABDOMINAL  HYSTERECTOMY     arthroscopy right knee     TOTAL KNEE ARTHROPLASTY Left 08/15/2016   Procedure: TOTAL KNEE ARTHROPLASTY;  Surgeon: Vickki Hearing, MD;  Location: [facility];  Service: Orthopedics;  Laterality: Left;   TOTAL KNEE ARTHROPLASTY Right 01/01/2022   Procedure: TOTAL KNEE ARTHROPLASTY;  Surgeon: Vickki Hearing, MD;  Location: [facility];  Service: Orthopedics;  Laterality: Right;     No Known Allergies    Family History  Problem Relation Age of Onset   Parkinson's disease Father    Breast cancer Sister      Social History Norma Barton reports that she has never smoked. She has never used smokeless tobacco. Norma Barton reports no history of alcohol use.   Review of Systems CONSTITUTIONAL: No weight loss, fever, chills, weakness or fatigue.  HEENT: Eyes: No visual loss, blurred vision, double vision or yellow sclerae.No hearing loss, sneezing, congestion, runny nose or sore throat.  SKIN: No rash or itching.  CARDIOVASCULAR: per hpi RESPIRATORY: No shortness of breath, cough or sputum.  GASTROINTESTINAL: No anorexia, nausea, vomiting or diarrhea. No abdominal pain or blood.  GENITOURINARY: No burning on urination, no polyuria NEUROLOGICAL: No headache, dizziness, syncope, paralysis, ataxia, numbness or tingling in the extremities. No change in bowel or bladder control.  MUSCULOSKELETAL: No muscle, back pain, joint pain or  stiffness.  LYMPHATICS: No enlarged nodes. No history of splenectomy.  PSYCHIATRIC: No history of depression or anxiety.  ENDOCRINOLOGIC: No reports of sweating, cold or heat intolerance. No polyuria or polydipsia.  Marland Kitchen   Physical Examination Today's Vitals   03/29/22 1054  BP: 114/70  Pulse: 79  SpO2: 97%  Weight: 158 lb (71.7 kg)  Height: 5\' 2"  (1.575 m)   Body mass index is 28.9 kg/m.  Gen: resting comfortably, no acute distress HEENT: no scleral icterus, pupils equal round and reactive, no palptable cervical adenopathy,  CV: RRR, no  m/r/ gno jvd Resp: Clear to auscultation bilaterally GI: abdomen is soft, non-tender, non-distended, normal bowel sounds, no hepatosplenomegaly MSK: extremities are warm, no edema.  Skin: warm, no rash Neuro:  no focal deficits Psych: appropriate affect   Diagnostic Studies 04/2021 echo IMPRESSIONS     1. Left ventricular ejection fraction, by estimation, is 60 to 65%. The  left ventricle has normal function. The left ventricle has no regional  wall motion abnormalities. There is mild left ventricular hypertrophy of  the basal-septal segment. Left  ventricular diastolic parameters are indeterminate.   2. Right ventricular systolic function is normal. The right ventricular  size is normal. There is normal pulmonary artery systolic pressure.   3. Left atrial size was moderately dilated.   4. The mitral valve is normal in structure. No evidence of mitral valve  regurgitation. No evidence of mitral stenosis.   5. The aortic valve was not well visualized. Aortic valve regurgitation  is not visualized. No aortic stenosis is present.   6. The inferior vena cava is normal in size with greater than 50%  respiratory variability, suggesting right atrial pressure of 3 mmHg.        Assessment and Plan   Aflutter,paroxysmal - no recent symptoms - continue current meds including eliquis for stroke prevention  2. Thrombocytopenia - occurred after knee surgery platelets down to 90 - since on anticoag would repeat to clarify have not futrther declined          05/2021, M.D

## 2022-03-30 LAB — CBC
Hematocrit: 45.5 % (ref 34.0–46.6)
Hemoglobin: 14.8 g/dL (ref 11.1–15.9)
MCH: 32.2 pg (ref 26.6–33.0)
MCHC: 32.5 g/dL (ref 31.5–35.7)
MCV: 99 fL — ABNORMAL HIGH (ref 79–97)
Platelets: 143 10*3/uL — ABNORMAL LOW (ref 150–450)
RBC: 4.59 x10E6/uL (ref 3.77–5.28)
RDW: 11.8 % (ref 11.7–15.4)
WBC: 5.4 10*3/uL (ref 3.4–10.8)

## 2022-04-02 ENCOUNTER — Telehealth: Payer: Self-pay | Admitting: Nurse Practitioner

## 2022-04-02 NOTE — Telephone Encounter (Signed)
Patient calling to check on lab work results. Please review and call back.

## 2022-04-03 NOTE — Telephone Encounter (Signed)
Pt r/c.

## 2022-04-03 NOTE — Telephone Encounter (Signed)
LM to RC  

## 2022-04-10 NOTE — Telephone Encounter (Signed)
Unable to reach patient. Patient had last labs done at cardiology office

## 2022-04-16 ENCOUNTER — Telehealth: Payer: Self-pay | Admitting: Cardiology

## 2022-04-16 NOTE — Telephone Encounter (Signed)
Patient called to get results of lab tests done on 7/14.

## 2022-04-17 ENCOUNTER — Encounter: Payer: Self-pay | Admitting: Family Medicine

## 2022-04-17 ENCOUNTER — Ambulatory Visit (INDEPENDENT_AMBULATORY_CARE_PROVIDER_SITE_OTHER): Payer: Medicare HMO | Admitting: Family Medicine

## 2022-04-17 VITALS — BP 119/81 | HR 85 | Temp 98.6°F | Ht 62.0 in | Wt 159.0 lb

## 2022-04-17 DIAGNOSIS — H6121 Impacted cerumen, right ear: Secondary | ICD-10-CM

## 2022-04-17 DIAGNOSIS — K1120 Sialoadenitis, unspecified: Secondary | ICD-10-CM

## 2022-04-17 DIAGNOSIS — R300 Dysuria: Secondary | ICD-10-CM

## 2022-04-17 LAB — URINALYSIS, COMPLETE
Bilirubin, UA: NEGATIVE
Glucose, UA: NEGATIVE
Ketones, UA: NEGATIVE
Nitrite, UA: NEGATIVE
Protein,UA: NEGATIVE
RBC, UA: NEGATIVE
Specific Gravity, UA: 1.01 (ref 1.005–1.030)
Urobilinogen, Ur: 0.2 mg/dL (ref 0.2–1.0)
pH, UA: 6 (ref 5.0–7.5)

## 2022-04-17 LAB — MICROSCOPIC EXAMINATION
RBC, Urine: NONE SEEN /hpf (ref 0–2)
Renal Epithel, UA: NONE SEEN /hpf

## 2022-04-17 MED ORDER — AMOXICILLIN-POT CLAVULANATE 875-125 MG PO TABS: 1.0000 | ORAL_TABLET | Freq: Two times a day (BID) | ORAL | 0 refills | Status: DC

## 2022-04-17 NOTE — Progress Notes (Signed)
Subjective:  Patient ID: Norma Barton, female    DOB: [birth year], 87 y.o.   MRN: [id number]  Patient Care Team: [name], FNP as PCP - General (Nurse Practitioner) Antoine Poche, MD as PCP - Cardiology (Cardiology) Randa Spike Kelton Pillar, LCSW as Social Worker (Licensed Clinical Social Worker) Conley Rolls, Westley Chandler, MD as Referring Physician (Optometry) Vickki Hearing, MD as Consulting Physician (Orthopedic Surgery)   Chief Complaint:  Edema (Under left ear - pt states it has been there about 2 days now )   HPI: Norma Barton is a 87 y.o. female presenting on 04/17/2022 for Edema (Under left ear - pt states it has been there about 2 days now )   Pt presents today with complaints of swelling under left ear which is tender to touch. No redness or increased warmth. States she noticed this about 2 days ago. No fever, chills, weakness, or otalgia. Does report dry mouth. She also reports dysuria for with intermittent right lower back pain over the last 3-4 days. No confusion or weakness.   Dysuria  This is a new problem. The current episode started yesterday. The problem occurs every urination. The quality of the pain is described as burning. There has been no fever. She is Not sexually active. There is No history of pyelonephritis. Associated symptoms include flank pain. Pertinent negatives include no chills, discharge, frequency, hematuria, hesitancy, nausea, possible pregnancy, sweats, urgency or vomiting. She has tried nothing for the symptoms.      Relevant past medical, surgical, family, and social history reviewed and updated as indicated.  Allergies and medications reviewed and updated. Data reviewed: Chart in Epic.   Past Medical History:  Diagnosis Date   Arthritis    Diabetes mellitus without complication (HCC)    GERD (gastroesophageal reflux disease)    Hypertension     Past Surgical History:  Procedure Laterality Date   ABDOMINAL HYSTERECTOMY      arthroscopy right knee     TOTAL KNEE ARTHROPLASTY Left 08/15/2016   Procedure: TOTAL KNEE ARTHROPLASTY;  Surgeon: Vickki Hearing, MD;  Location: [facility];  Service: Orthopedics;  Laterality: Left;   TOTAL KNEE ARTHROPLASTY Right 01/01/2022   Procedure: TOTAL KNEE ARTHROPLASTY;  Surgeon: Vickki Hearing, MD;  Location: [facility];  Service: Orthopedics;  Laterality: Right;    Social History   Socioeconomic History   Marital status: Widowed    Spouse name: Not on file   Number of children: 5   Years of education: 96   Highest education level: 12th grade  Occupational History   Occupation: retired  Tobacco Use   Smoking status: Never   Smokeless tobacco: Never  Substance and Sexual Activity   Alcohol use: No   Drug use: No   Sexual activity: Not Currently  Other Topics Concern   Not on file  Social History Narrative   Lives alone in apartment   Has 5 sons - they all live within 30 miles or so   Had one daughter who died right after birth (cord wrapped around neck at birth)   Social Determinants of Health   Financial Resource Strain: Low Risk  (11/05/2021)   Overall Financial Resource Strain (CARDIA)    Difficulty of Paying Living Expenses: Not hard at all  Food Insecurity: No Food Insecurity (11/05/2021)   Hunger Vital Sign    Worried About Running Out of Food in the Last Year: Never true    Ran Out of Food in  the Last Year: Never true  Transportation Needs: No Transportation Needs (11/05/2021)   PRAPARE - Administrator, Civil Service (Medical): No    Lack of Transportation (Non-Medical): No  Physical Activity: Inactive (12/03/2021)   Exercise Vital Sign    Days of Exercise per Week: 0 days    Minutes of Exercise per Session: 0 min  Stress: Stress Concern Present (12/03/2021)   [employer] of Occupational Health - Occupational Stress Questionnaire    Feeling of Stress : To some extent  Social Connections: Moderately Isolated (11/05/2021)   Social  Connection and Isolation Panel [NHANES]    Frequency of Communication with Friends and Family: More than three times a week    Frequency of Social Gatherings with Friends and Family: More than three times a week    Attends Religious Services: Never    Database administrator or Organizations: Yes    Attends Engineer, structural: More than 4 times per year    Marital Status: Widowed  Intimate Partner Violence: Not At Risk (11/05/2021)   Humiliation, Afraid, Rape, and Kick questionnaire    Fear of Current or Ex-Partner: No    Emotionally Abused: No    Physically Abused: No    Sexually Abused: No    Outpatient Encounter Medications as of 04/17/2022  Medication Sig   acetaminophen (TYLENOL) 500 MG tablet Take 1,000 mg by mouth every 6 (six) hours as needed for mild pain.   amoxicillin-clavulanate (AUGMENTIN) 875-125 MG tablet Take 1 tablet by mouth 2 (two) times daily.   apixaban (ELIQUIS) 5 MG TABS tablet Take 1 tablet (5 mg total) by mouth 2 (two) times daily.   cholecalciferol (VITAMIN D) 25 MCG (1000 UNIT) tablet Take 2,000 Units by mouth in the morning.   docusate sodium (COLACE) 100 MG capsule Take 1 capsule (100 mg total) by mouth 2 (two) times daily.   furosemide (LASIX) 20 MG tablet TAKE 1 TABLET BY MOUTH EVERY DAY (Patient taking differently: Take 20 mg by mouth daily.)   metoprolol succinate (TOPROL-XL) 25 MG 24 hr tablet Take 1 tablet (25 mg total) by mouth daily.   omeprazole (PRILOSEC) 40 MG capsule TAKE 1 CAPSULE (40 MG TOTAL) BY MOUTH DAILY.   polyethylene glycol (MIRALAX / GLYCOLAX) 17 g packet Take 17 g by mouth daily as needed for mild constipation.   potassium chloride SA (KLOR-CON M) 20 MEQ tablet Take 1 tablet (20 mEq total) by mouth daily.   triamcinolone cream (KENALOG) 0.1 % Apply 1 application topically 2 (two) times daily.   No facility-administered encounter medications on file as of 04/17/2022.    No Known Allergies  Review of Systems  Constitutional:   Negative for activity change, appetite change, chills, diaphoresis, fatigue, fever and unexpected weight change.  HENT:  Positive for facial swelling (left jaw). Negative for congestion, dental problem, drooling, ear discharge, ear pain, hearing loss, mouth sores, nosebleeds, postnasal drip, rhinorrhea, sinus pressure, sinus pain, sneezing, sore throat, tinnitus, trouble swallowing and voice change.   Respiratory:  Negative for cough and shortness of breath.   Cardiovascular:  Negative for chest pain, palpitations and leg swelling.  Gastrointestinal:  Negative for abdominal pain, constipation, diarrhea, nausea and vomiting.  Genitourinary:  Positive for dysuria and flank pain. Negative for decreased urine volume, difficulty urinating, dyspareunia, enuresis, frequency, genital sores, hematuria, hesitancy, menstrual problem, pelvic pain, urgency, vaginal bleeding, vaginal discharge and vaginal pain.  Musculoskeletal:  Positive for arthralgias and gait problem.  Neurological:  Negative  for dizziness, weakness, light-headedness and headaches.  Psychiatric/Behavioral:  Negative for confusion.   All other systems reviewed and are negative.       Objective:  BP 119/81   Pulse 85   Temp 98.6 F (37 C)   Ht 5\' 2"  (1.575 m)   Wt 159 lb (72.1 kg)   SpO2 95%   BMI 29.08 kg/m    Wt Readings from Last 3 Encounters:  04/17/22 159 lb (72.1 kg)  03/29/22 158 lb (71.7 kg)  01/29/22 156 lb (70.8 kg)    Physical Exam Vitals and nursing note reviewed.  Constitutional:      General: She is not in acute distress.    Appearance: Normal appearance. She is not ill-appearing, toxic-appearing or diaphoretic.  HENT:     Head: Normocephalic and atraumatic.     Salivary Glands: Right salivary gland is not diffusely enlarged or tender. Left salivary gland is diffusely enlarged and tender.     Right Ear: There is impacted cerumen.     Left Ear: Tympanic membrane, ear canal and external ear normal.     Nose:  Nose normal.     Mouth/Throat:     Mouth: Mucous membranes are dry.     Pharynx: Oropharynx is clear. No oropharyngeal exudate or posterior oropharyngeal erythema.  Eyes:     Conjunctiva/sclera: Conjunctivae normal.     Pupils: Pupils are equal, round, and reactive to light.  Cardiovascular:     Rate and Rhythm: Normal rate and regular rhythm.     Heart sounds: Normal heart sounds.  Pulmonary:     Effort: Pulmonary effort is normal.     Breath sounds: Normal breath sounds.  Skin:    General: Skin is warm and dry.     Capillary Refill: Capillary refill takes less than 2 seconds.     Findings: Rash (scaly lesions to bilateral forearms) present.  Neurological:     General: No focal deficit present.     Mental Status: She is alert and oriented to person, place, and time.     Gait: Gait abnormal (using cane).  Psychiatric:        Mood and Affect: Mood normal.        Behavior: Behavior normal.        Thought Content: Thought content normal.        Judgment: Judgment normal.     Results for orders placed or performed in visit on 03/29/22  CBC  Result Value Ref Range   WBC 5.4 3.4 - 10.8 x10E3/uL   RBC 4.59 3.77 - 5.28 x10E6/uL   Hemoglobin 14.8 11.1 - 15.9 g/dL   Hematocrit 03/31/22 49.6 - 46.6 %   MCV 99 (H) 79 - 97 fL   MCH 32.2 26.6 - 33.0 pg   MCHC 32.5 31.5 - 35.7 g/dL   RDW 75.9 16.3 - 84.6 %   Platelets 143 (L) 150 - 450 x10E3/uL     Ear Cerumen Removal  Date/Time: 04/17/2022 2:46 PM  Performed by: 06/17/2022, FNP Authorized by: Sonny Masters, FNP   Anesthesia: Local Anesthetic: none Location details: right ear Patient tolerance: patient tolerated the procedure well with no immediate complications Comments: TM normal post cerumen removal Procedure type: curette (and irrigation)  Sedation: Patient sedated: no     Pertinent labs & imaging results that were available during my care of the patient were reviewed by me and considered in my medical decision  making.  Assessment & Plan:  Norma Barton was  seen today for edema.  Diagnoses and all orders for this visit:  Parotiditis Symptomatic care discussed in detail. Aware to suck on sour candy. Increase water intake to prevent dry mouth. Medications as prescribed. Report new, worsening, or persistent symptoms. Follow up for reevaluation in 2 weeks. -     amoxicillin-clavulanate (AUGMENTIN) 875-125 MG tablet; Take 1 tablet by mouth 2 (two) times daily.  Dysuria No indications of infection, culture pending, will treat if warranted. Aware to increase water intake.  -     Urine Culture -     Urinalysis, Complete  Impacted cerumen of right ear Cerumen removed in office, tolerated well. Prevention discussed in detail.  -     Ear Cerumen Removal     Continue all other maintenance medications.  Follow up plan: Return in about 2 weeks (around 05/01/2022), or if symptoms worsen or fail to improve, for parotitis .   Continue healthy lifestyle choices, including diet (rich in fruits, vegetables, and lean proteins, and low in salt and simple carbohydrates) and exercise (at least 30 minutes of moderate physical activity daily).  Educational handout given for parotitis  The above assessment and management plan was discussed with the patient. The patient verbalized understanding of and has agreed to the management plan. Patient is aware to call the clinic if they develop any new symptoms or if symptoms persist or worsen. Patient is aware when to return to the clinic for a follow-up visit. Patient educated on when it is appropriate to go to the emergency department.   Norma Baars, FNP-C [facility] [phone]

## 2022-04-18 NOTE — Telephone Encounter (Signed)
Lesle Chris, LPN  8/0/2233  6:12 PM EDT Back to Top    Notified, copy to pcp.    Antoine Poche, MD  04/18/2022  2:42 PM EDT     Labs look good   Dominga Ferry MD

## 2022-04-19 LAB — URINE CULTURE

## 2022-05-03 ENCOUNTER — Ambulatory Visit (INDEPENDENT_AMBULATORY_CARE_PROVIDER_SITE_OTHER): Payer: Medicare HMO | Admitting: Nurse Practitioner

## 2022-05-03 ENCOUNTER — Encounter: Payer: Self-pay | Admitting: Nurse Practitioner

## 2022-05-03 VITALS — BP 127/77 | HR 73 | Temp 97.3°F | Resp 20 | Ht 62.0 in | Wt 160.0 lb

## 2022-05-03 DIAGNOSIS — R69 Illness, unspecified: Secondary | ICD-10-CM | POA: Diagnosis not present

## 2022-05-03 DIAGNOSIS — I10 Essential (primary) hypertension: Secondary | ICD-10-CM | POA: Diagnosis not present

## 2022-05-03 DIAGNOSIS — S00411A Abrasion of right ear, initial encounter: Secondary | ICD-10-CM

## 2022-05-03 DIAGNOSIS — I480 Paroxysmal atrial fibrillation: Secondary | ICD-10-CM | POA: Diagnosis not present

## 2022-05-03 DIAGNOSIS — E119 Type 2 diabetes mellitus without complications: Secondary | ICD-10-CM | POA: Diagnosis not present

## 2022-05-03 DIAGNOSIS — F5101 Primary insomnia: Secondary | ICD-10-CM

## 2022-05-03 DIAGNOSIS — Z6830 Body mass index (BMI) 30.0-30.9, adult: Secondary | ICD-10-CM

## 2022-05-03 DIAGNOSIS — R609 Edema, unspecified: Secondary | ICD-10-CM

## 2022-05-03 DIAGNOSIS — K219 Gastro-esophageal reflux disease without esophagitis: Secondary | ICD-10-CM | POA: Diagnosis not present

## 2022-05-03 DIAGNOSIS — E876 Hypokalemia: Secondary | ICD-10-CM | POA: Diagnosis not present

## 2022-05-03 DIAGNOSIS — R600 Localized edema: Secondary | ICD-10-CM | POA: Insufficient documentation

## 2022-05-03 LAB — BAYER DCA HB A1C WAIVED: HB A1C (BAYER DCA - WAIVED): 6.7 % — ABNORMAL HIGH (ref 4.8–5.6)

## 2022-05-03 MED ORDER — OMEPRAZOLE 40 MG PO CPDR: 40.0000 mg | DELAYED_RELEASE_CAPSULE | Freq: Every day | ORAL | 0 refills | Status: DC

## 2022-05-03 MED ORDER — FUROSEMIDE 20 MG PO TABS: 20.0000 mg | ORAL_TABLET | Freq: Every day | ORAL | 1 refills | Status: DC

## 2022-05-03 MED ORDER — METOPROLOL SUCCINATE ER 25 MG PO TB24: 25.0000 mg | ORAL_TABLET | Freq: Every day | ORAL | 1 refills | Status: DC

## 2022-05-03 MED ORDER — APIXABAN 5 MG PO TABS: 5.0000 mg | ORAL_TABLET | Freq: Two times a day (BID) | ORAL | 6 refills | Status: DC

## 2022-05-03 MED ORDER — POTASSIUM CHLORIDE CRYS ER 20 MEQ PO TBCR: 20.0000 meq | EXTENDED_RELEASE_TABLET | Freq: Every day | ORAL | 1 refills | Status: DC

## 2022-05-03 NOTE — Patient Instructions (Signed)

## 2022-05-03 NOTE — Progress Notes (Signed)
Subjective:    Patient ID: Norma Barton, female    DOB: 11/16/1934, 87 y.o.   MRN: [id number]   Chief Complaint: medical management of chronic issues     HPI:  Norma Barton is a 87 y.o. who identifies as a female who was assigned female at birth.   Social history: Lives with: by herself Work history: retired   Scientist, forensic in today for follow up of the following chronic medical issues:  1. Primary hypertension No c/o chest pain, sob or headache. Does not check blood pressure at home. BP Readings from Last 3 Encounters:  04/17/22 119/81  03/29/22 114/70  01/29/22 107/66     2. Paroxysmal atrial fibrillation (HCC) Denies palpitations or heart racing. Is on eliquis with no bleeding issues  3. Hypokalemia No muscle cramps Lab Results  Component Value Date   K 3.7 01/02/2022     4. Diabetes mellitus without complication (HCC) Is on no meds- is just doing diet control. Does not check blood sugars at home. Lab Results  Component Value Date   HGBA1C 6.7 (H) 11/02/2021     5. Gastroesophageal reflux disease without esophagitis Is on omeprazole daily and is doing well.  6. Primary insomnia Sleeping well. Sleesp about 6-8 hours a night. Takes tylenol PM occasionally  7. BMI 30.0-30.9,adult No recent weight changes Wt Readings from Last 3 Encounters:  05/03/22 160 lb (72.6 kg)  04/17/22 159 lb (72.1 kg)  03/29/22 158 lb (71.7 kg)   BMI Readings from Last 3 Encounters:  05/03/22 29.26 kg/m  04/17/22 29.08 kg/m  03/29/22 28.90 kg/m     New complaints: Right ear pain  No Known Allergies Outpatient Encounter Medications as of 05/03/2022  Medication Sig   acetaminophen (TYLENOL) 500 MG tablet Take 1,000 mg by mouth every 6 (six) hours as needed for mild pain.   amoxicillin-clavulanate (AUGMENTIN) 875-125 MG tablet Take 1 tablet by mouth 2 (two) times daily.   apixaban (ELIQUIS) 5 MG TABS tablet Take 1 tablet (5 mg total) by mouth 2 (two) times daily.    cholecalciferol (VITAMIN D) 25 MCG (1000 UNIT) tablet Take 2,000 Units by mouth in the morning.   docusate sodium (COLACE) 100 MG capsule Take 1 capsule (100 mg total) by mouth 2 (two) times daily.   furosemide (LASIX) 20 MG tablet TAKE 1 TABLET BY MOUTH EVERY DAY (Patient taking differently: Take 20 mg by mouth daily.)   metoprolol succinate (TOPROL-XL) 25 MG 24 hr tablet Take 1 tablet (25 mg total) by mouth daily.   omeprazole (PRILOSEC) 40 MG capsule TAKE 1 CAPSULE (40 MG TOTAL) BY MOUTH DAILY.   polyethylene glycol (MIRALAX / GLYCOLAX) 17 g packet Take 17 g by mouth daily as needed for mild constipation.   potassium chloride SA (KLOR-CON M) 20 MEQ tablet Take 1 tablet (20 mEq total) by mouth daily.   triamcinolone cream (KENALOG) 0.1 % Apply 1 application topically 2 (two) times daily.   No facility-administered encounter medications on file as of 05/03/2022.    Past Surgical History:  Procedure Laterality Date   ABDOMINAL HYSTERECTOMY     arthroscopy right knee     TOTAL KNEE ARTHROPLASTY Left 08/15/2016   Procedure: TOTAL KNEE ARTHROPLASTY;  Surgeon: Carole Civil, MD;  Location: [facility];  Service: Orthopedics;  Laterality: Left;   TOTAL KNEE ARTHROPLASTY Right 01/01/2022   Procedure: TOTAL KNEE ARTHROPLASTY;  Surgeon: Carole Civil, MD;  Location: [facility];  Service: Orthopedics;  Laterality: Right;  Family History  Problem Relation Age of Onset   Parkinson's disease Father    Breast cancer Sister       Controlled substance contract: n/a      Review of Systems  Constitutional:  Negative for diaphoresis.  Eyes:  Negative for pain.  Respiratory:  Negative for shortness of breath.   Cardiovascular:  Negative for chest pain, palpitations and leg swelling.  Gastrointestinal:  Negative for abdominal pain.  Endocrine: Negative for polydipsia.  Skin:  Negative for rash.  Neurological:  Negative for dizziness, weakness and headaches.  Hematological:  Does not  bruise/bleed easily.  All other systems reviewed and are negative.      Objective:   Physical Exam Vitals and nursing note reviewed.  Constitutional:      General: She is not in acute distress.    Appearance: Normal appearance. She is well-developed.  HENT:     Head: Normocephalic.     Right Ear: Tympanic membrane normal.     Left Ear: Tympanic membrane normal.     Ears:     Comments: right ear canal has scratch with some blood    Nose: Nose normal.     Mouth/Throat:     Mouth: Mucous membranes are moist.  Eyes:     Pupils: Pupils are equal, round, and reactive to light.  Neck:     Vascular: No carotid bruit or JVD.  Cardiovascular:     Rate and Rhythm: Normal rate and regular rhythm.     Heart sounds: Normal heart sounds.  Pulmonary:     Effort: Pulmonary effort is normal. No respiratory distress.     Breath sounds: Normal breath sounds. No wheezing or rales.  Chest:     Chest wall: No tenderness.  Abdominal:     General: Bowel sounds are normal. There is no distension or abdominal bruit.     Palpations: Abdomen is soft. There is no hepatomegaly, splenomegaly, mass or pulsatile mass.     Tenderness: There is no abdominal tenderness.  Musculoskeletal:        General: Normal range of motion.     Cervical back: Normal range of motion and neck supple.     Right lower leg: Edema (2+) present.     Left lower leg: Edema (2+) present.  Lymphadenopathy:     Cervical: No cervical adenopathy.  Skin:    General: Skin is warm and dry.  Neurological:     Mental Status: She is alert and oriented to person, place, and time.     Deep Tendon Reflexes: Reflexes are normal and symmetric.  Psychiatric:        Behavior: Behavior normal.        Thought Content: Thought content normal.        Judgment: Judgment normal.     BP 127/77   Pulse 73   Temp (!) 97.3 F (36.3 C) (Temporal)   Resp 20   Ht 5' 2"  (1.575 m)   Wt 160 lb (72.6 kg)   SpO2 97%   BMI 29.26 kg/m         Assessment & Plan:  ALIYYAH RIESE comes in today with chief complaint of Medical Management of Chronic Issues (Check right ear. Cleaned out at last visit and has been bleeding since and hard to hear)   Diagnosis and orders addressed:  1. Primary hypertension Low sodium diet - CBC with Differential/Platelet - CMP14+EGFR - Lipid panel  2. Paroxysmal atrial fibrillation (HCC) Avoid caffeine - apixaban (  ELIQUIS) 5 MG TABS tablet; Take 1 tablet (5 mg total) by mouth 2 (two) times daily.  Dispense: 60 tablet; Refill: 6 - metoprolol succinate (TOPROL-XL) 25 MG 24 hr tablet; Take 1 tablet (25 mg total) by mouth daily.  Dispense: 90 tablet; Refill: 1  3. Hypokalemia Continue potassium supplement - potassium chloride SA (KLOR-CON M) 20 MEQ tablet; Take 1 tablet (20 mEq total) by mouth daily.  Dispense: 90 tablet; Refill: 1  4. Diabetes mellitus without complication ([place]) Watch carbs in diet - Bayer DCA Hb A1c Waived  5. Gastroesophageal reflux disease without esophagitis Avoid spicy foods Do not eat 2 hours prior to bedtime - omeprazole (PRILOSEC) 40 MG capsule; Take 1 capsule (40 mg total) by mouth daily.  Dispense: 90 capsule; Refill: 0  6. Primary insomnia Bedtime routine  7. BMI 30.0-30.9,adult Discussed diet and exercise for person with BMI >25 Will recheck weight in 3-6 months   8. Abrasion of right ear canal, initial encounter Debrox drops 2-3 x a week Do not stick anything in ear canal  9. Peripheral edema Elevate legs when sitting - furosemide (LASIX) 20 MG tablet; Take 1 tablet (20 mg total) by mouth daily.  Dispense: 90 tablet; Refill: 1   Labs pending Health Maintenance reviewed Diet and exercise encouraged  Follow up plan: 6 months   [name] Done, FNP

## 2022-05-04 LAB — CMP14+EGFR
ALT: 15 IU/L (ref 0–32)
AST: 35 IU/L (ref 0–40)
Albumin/Globulin Ratio: 1.2 (ref 1.2–2.2)
Albumin: 3.8 g/dL (ref 3.7–4.7)
Alkaline Phosphatase: 86 IU/L (ref 44–121)
BUN/Creatinine Ratio: 15 (ref 12–28)
BUN: 14 mg/dL (ref 8–27)
Bilirubin Total: 0.5 mg/dL (ref 0.0–1.2)
CO2: 25 mmol/L (ref 20–29)
Calcium: 9.7 mg/dL (ref 8.7–10.3)
Chloride: 101 mmol/L (ref 96–106)
Creatinine, Ser: 0.91 mg/dL (ref 0.57–1.00)
Globulin, Total: 3.1 g/dL (ref 1.5–4.5)
Glucose: 172 mg/dL — ABNORMAL HIGH (ref 70–99)
Potassium: 4.2 mmol/L (ref 3.5–5.2)
Sodium: 144 mmol/L (ref 134–144)
Total Protein: 6.9 g/dL (ref 6.0–8.5)
eGFR: 62 mL/min/{1.73_m2} (ref >59)

## 2022-05-04 LAB — CBC WITH DIFFERENTIAL/PLATELET
Basophils Absolute: 0 10*3/uL (ref 0.0–0.2)
Basos: 1 %
EOS (ABSOLUTE): 0.2 10*3/uL (ref 0.0–0.4)
Eos: 2 %
Hematocrit: 42.4 % (ref 34.0–46.6)
Hemoglobin: 14.3 g/dL (ref 11.1–15.9)
Immature Grans (Abs): 0 10*3/uL (ref 0.0–0.1)
Immature Granulocytes: 0 %
Lymphocytes Absolute: 2.5 10*3/uL (ref 0.7–3.1)
Lymphs: 33 %
MCH: 32.3 pg (ref 26.6–33.0)
MCHC: 33.7 g/dL (ref 31.5–35.7)
MCV: 96 fL (ref 79–97)
Monocytes Absolute: 0.6 10*3/uL (ref 0.1–0.9)
Monocytes: 7 %
Neutrophils Absolute: 4.2 10*3/uL (ref 1.4–7.0)
Neutrophils: 57 %
Platelets: 143 10*3/uL — ABNORMAL LOW (ref 150–450)
RBC: 4.43 x10E6/uL (ref 3.77–5.28)
RDW: 12.1 % (ref 11.7–15.4)
WBC: 7.5 10*3/uL (ref 3.4–10.8)

## 2022-05-04 LAB — LIPID PANEL
Chol/HDL Ratio: 2.6 ratio (ref 0.0–4.4)
Cholesterol, Total: 134 mg/dL (ref 100–199)
HDL: 52 mg/dL (ref >39)
LDL Chol Calc (NIH): 67 mg/dL (ref 0–99)
Triglycerides: 74 mg/dL (ref 0–149)
VLDL Cholesterol Cal: 15 mg/dL (ref 5–40)

## 2022-06-14 ENCOUNTER — Other Ambulatory Visit: Payer: Self-pay | Admitting: Nurse Practitioner

## 2022-06-14 DIAGNOSIS — L409 Psoriasis, unspecified: Secondary | ICD-10-CM

## 2022-06-27 ENCOUNTER — Ambulatory Visit: Payer: Medicare HMO | Admitting: Orthopedic Surgery

## 2022-07-17 ENCOUNTER — Ambulatory Visit: Payer: Medicare HMO

## 2022-08-19 ENCOUNTER — Ambulatory Visit: Payer: Medicare HMO | Admitting: Orthopedic Surgery

## 2022-08-19 ENCOUNTER — Encounter: Payer: Self-pay | Admitting: Orthopedic Surgery

## 2022-08-19 VITALS — BP 123/78 | HR 75

## 2022-08-19 DIAGNOSIS — M1711 Unilateral primary osteoarthritis, right knee: Secondary | ICD-10-CM | POA: Diagnosis not present

## 2022-08-19 DIAGNOSIS — Z96651 Presence of right artificial knee joint: Secondary | ICD-10-CM | POA: Diagnosis not present

## 2022-08-19 NOTE — Progress Notes (Signed)
Chief Complaint  Patient presents with   history of TKR RT    DOS 01/01/22 Doing well. Walking without a cane    Recheck visit status post right total knee arthroplasty on January 01, 2022  Norma Barton is doing well she has no complaint she is ambulatory without a cane or assistive device  We can see her in April for 1 year follow-up for x-ray  Will x-ray both knees when she comes back  She says that her left total knee which she had done previously in 2017 is giving her a little difficulty but seems like it is minor discomfort

## 2022-09-17 ENCOUNTER — Other Ambulatory Visit: Payer: Self-pay | Admitting: Nurse Practitioner

## 2022-09-17 DIAGNOSIS — L409 Psoriasis, unspecified: Secondary | ICD-10-CM

## 2022-09-18 ENCOUNTER — Ambulatory Visit (INDEPENDENT_AMBULATORY_CARE_PROVIDER_SITE_OTHER): Payer: Medicare HMO

## 2022-09-18 DIAGNOSIS — Z23 Encounter for immunization: Secondary | ICD-10-CM | POA: Diagnosis not present

## 2022-10-14 ENCOUNTER — Ambulatory Visit: Payer: Medicare HMO | Attending: Cardiology | Admitting: Cardiology

## 2022-10-14 ENCOUNTER — Encounter: Payer: Self-pay | Admitting: Cardiology

## 2022-10-14 VITALS — BP 106/72 | HR 80 | Ht 62.0 in | Wt 165.8 lb

## 2022-10-14 DIAGNOSIS — R609 Edema, unspecified: Secondary | ICD-10-CM | POA: Diagnosis not present

## 2022-10-14 DIAGNOSIS — I10 Essential (primary) hypertension: Secondary | ICD-10-CM | POA: Diagnosis not present

## 2022-10-14 DIAGNOSIS — Z79899 Other long term (current) drug therapy: Secondary | ICD-10-CM | POA: Diagnosis not present

## 2022-10-14 MED ORDER — FUROSEMIDE 40 MG PO TABS: 40.0000 mg | ORAL_TABLET | Freq: Every day | ORAL | 6 refills | Status: DC

## 2022-10-14 NOTE — Patient Instructions (Signed)
Medication Instructions:  Increase Lasix to 40mg  daily  Continue all other medications.     Labwork: BMET, Mg - orders given today Please do in 2 weeks  Office will contact with results via phone, letter or mychart.     Testing/Procedures: none  Follow-Up: 6 months   Any Other Special Instructions Will Be Listed Below (If Applicable).   If you need a refill on your cardiac medications before your next appointment, please call your pharmacy.

## 2022-10-14 NOTE — Progress Notes (Signed)
Clinical Summary Ms. Quiggle is a 88 y.o.female seen today for follow up of the following medical problems.    1.Aflutter,paroxysmal - new diagnosis, noted during 03/2021 preop evaluation for knee replacement - has been paroxysmal    - rare palpitations, mild - no bleeding on eliquis.    2. LE edema - 04/2021 echo LVEF 55-37%, indet diastolic,  -chronic LE edema   Past Medical History:  Diagnosis Date   Arthritis    Diabetes mellitus without complication (HCC)    GERD (gastroesophageal reflux disease)    Hypertension      No Known Allergies   Current Outpatient Medications  Medication Sig Dispense Refill   acetaminophen (TYLENOL) 500 MG tablet Take 1,000 mg by mouth every 6 (six) hours as needed for mild pain.     apixaban (ELIQUIS) 5 MG TABS tablet Take 1 tablet (5 mg total) by mouth 2 (two) times daily. 60 tablet 6   cholecalciferol (VITAMIN D) 25 MCG (1000 UNIT) tablet Take 2,000 Units by mouth in the morning.     docusate sodium (COLACE) 100 MG capsule Take 1 capsule (100 mg total) by mouth 2 (two) times daily. 10 capsule 0   furosemide (LASIX) 20 MG tablet Take 1 tablet (20 mg total) by mouth daily. 90 tablet 1   metoprolol succinate (TOPROL-XL) 25 MG 24 hr tablet Take 1 tablet (25 mg total) by mouth daily. 90 tablet 1   omeprazole (PRILOSEC) 40 MG capsule Take 1 capsule (40 mg total) by mouth daily. 90 capsule 0   polyethylene glycol (MIRALAX / GLYCOLAX) 17 g packet Take 17 g by mouth daily as needed for mild constipation. 14 each 0   potassium chloride SA (KLOR-CON M) 20 MEQ tablet Take 1 tablet (20 mEq total) by mouth daily. 90 tablet 1   triamcinolone cream (KENALOG) 0.1 % APPLY TO AFFECTED AREA TWICE A DAY 30 g 0   No current facility-administered medications for this visit.     Past Surgical History:  Procedure Laterality Date   ABDOMINAL HYSTERECTOMY     arthroscopy right knee     TOTAL KNEE ARTHROPLASTY Left 08/15/2016   Procedure: TOTAL KNEE  ARTHROPLASTY;  Surgeon: Carole Civil, MD;  Location: [facility];  Service: Orthopedics;  Laterality: Left;   TOTAL KNEE ARTHROPLASTY Right 01/01/2022   Procedure: TOTAL KNEE ARTHROPLASTY;  Surgeon: Carole Civil, MD;  Location: [facility];  Service: Orthopedics;  Laterality: Right;     No Known Allergies    Family History  Problem Relation Age of Onset   Parkinson's disease Father    Breast cancer Sister      Social History Ms. Deutscher reports that she has never smoked. She has never used smokeless tobacco. Ms. Runkel reports no history of alcohol use.   Review of Systems CONSTITUTIONAL: No weight loss, fever, chills, weakness or fatigue.  HEENT: Eyes: No visual loss, blurred vision, double vision or yellow sclerae.No hearing loss, sneezing, congestion, runny nose or sore throat.  SKIN: No rash or itching.  CARDIOVASCULAR: per hpi RESPIRATORY: No shortness of breath, cough or sputum.  GASTROINTESTINAL: No anorexia, nausea, vomiting or diarrhea. No abdominal pain or blood.  GENITOURINARY: No burning on urination, no polyuria NEUROLOGICAL: No headache, dizziness, syncope, paralysis, ataxia, numbness or tingling in the extremities. No change in bowel or bladder control.  MUSCULOSKELETAL: No muscle, back pain, joint pain or stiffness.  LYMPHATICS: No enlarged nodes. No history of splenectomy.  PSYCHIATRIC: No history of depression  or anxiety.  ENDOCRINOLOGIC: No reports of sweating, cold or heat intolerance. No polyuria or polydipsia.  Marland Kitchen   Physical Examination Today's Vitals   10/14/22 1352  BP: 106/72  Pulse: 80  SpO2: 96%  Weight: 165 lb 12.8 oz (75.2 kg)  Height: 5\' 2"  (1.575 m)   Body mass index is 30.33 kg/m.  Gen: resting comfortably, no acute distress HEENT: no scleral icterus, pupils equal round and reactive, no palptable cervical adenopathy,  CV: RRR, no mrg, no jvd Resp: Clear to auscultation bilaterally GI: abdomen is soft, non-tender, non-distended,  normal bowel sounds, no hepatosplenomegaly MSK: extremities are warm, 1+ bilateral LE edema Skin: warm, no rash Neuro:  no focal deficits Psych: appropriate affect   Diagnostic Studies 04/2021 echo IMPRESSIONS     1. Left ventricular ejection fraction, by estimation, is 60 to 65%. The  left ventricle has normal function. The left ventricle has no regional  wall motion abnormalities. There is mild left ventricular hypertrophy of  the basal-septal segment. Left  ventricular diastolic parameters are indeterminate.   2. Right ventricular systolic function is normal. The right ventricular  size is normal. There is normal pulmonary artery systolic pressure.   3. Left atrial size was moderately dilated.   4. The mitral valve is normal in structure. No evidence of mitral valve  regurgitation. No evidence of mitral stenosis.   5. The aortic valve was not well visualized. Aortic valve regurgitation  is not visualized. No aortic stenosis is present.   6. The inferior vena cava is normal in size with greater than 50%  respiratory variability, suggesting right atrial pressure of 3 mmHg.       Assessment and Plan  Aflutter,paroxysmal/acquired thrombophilia - overall doing well on toprol without significant symptoms, EKG today shows NSR - continue current meds including eliquis for stroke prevention   2. LE edema - benign echo 202 other than evidence of some diastolic dysfunction - some ongoing edema, increase lasix to 40mg  daily. BMET/Mg in 2 weeks.      Arnoldo Lenis, M.D.

## 2022-10-20 ENCOUNTER — Other Ambulatory Visit: Payer: Self-pay | Admitting: Nurse Practitioner

## 2022-10-20 DIAGNOSIS — I480 Paroxysmal atrial fibrillation: Secondary | ICD-10-CM

## 2022-11-04 ENCOUNTER — Other Ambulatory Visit: Payer: Self-pay

## 2022-11-04 ENCOUNTER — Encounter: Payer: Self-pay | Admitting: Nurse Practitioner

## 2022-11-04 ENCOUNTER — Telehealth: Payer: Self-pay | Admitting: Nurse Practitioner

## 2022-11-04 ENCOUNTER — Ambulatory Visit (INDEPENDENT_AMBULATORY_CARE_PROVIDER_SITE_OTHER): Payer: Medicare HMO | Admitting: Nurse Practitioner

## 2022-11-04 VITALS — BP 124/76 | HR 76 | Temp 97.6°F | Resp 20 | Ht 62.0 in | Wt 161.0 lb

## 2022-11-04 DIAGNOSIS — K219 Gastro-esophageal reflux disease without esophagitis: Secondary | ICD-10-CM

## 2022-11-04 DIAGNOSIS — F5101 Primary insomnia: Secondary | ICD-10-CM

## 2022-11-04 DIAGNOSIS — I10 Essential (primary) hypertension: Secondary | ICD-10-CM | POA: Diagnosis not present

## 2022-11-04 DIAGNOSIS — Z6830 Body mass index (BMI) 30.0-30.9, adult: Secondary | ICD-10-CM | POA: Diagnosis not present

## 2022-11-04 DIAGNOSIS — R609 Edema, unspecified: Secondary | ICD-10-CM | POA: Diagnosis not present

## 2022-11-04 DIAGNOSIS — E119 Type 2 diabetes mellitus without complications: Secondary | ICD-10-CM | POA: Diagnosis not present

## 2022-11-04 DIAGNOSIS — I480 Paroxysmal atrial fibrillation: Secondary | ICD-10-CM | POA: Diagnosis not present

## 2022-11-04 DIAGNOSIS — E876 Hypokalemia: Secondary | ICD-10-CM

## 2022-11-04 DIAGNOSIS — Z79899 Other long term (current) drug therapy: Secondary | ICD-10-CM | POA: Diagnosis not present

## 2022-11-04 DIAGNOSIS — R69 Illness, unspecified: Secondary | ICD-10-CM | POA: Diagnosis not present

## 2022-11-04 LAB — BAYER DCA HB A1C WAIVED: HB A1C (BAYER DCA - WAIVED): 7.8 % — ABNORMAL HIGH (ref 4.8–5.6)

## 2022-11-04 MED ORDER — FUROSEMIDE 40 MG PO TABS: 40.0000 mg | ORAL_TABLET | Freq: Every day | ORAL | 6 refills | Status: DC

## 2022-11-04 MED ORDER — APIXABAN 5 MG PO TABS: 5.0000 mg | ORAL_TABLET | Freq: Two times a day (BID) | ORAL | 6 refills | Status: DC

## 2022-11-04 MED ORDER — POTASSIUM CHLORIDE CRYS ER 20 MEQ PO TBCR: 20.0000 meq | EXTENDED_RELEASE_TABLET | Freq: Every day | ORAL | 1 refills | Status: DC

## 2022-11-04 MED ORDER — BLOOD GLUCOSE METER KIT: PACK | 0 refills | Status: DC

## 2022-11-04 MED ORDER — TRIAMCINOLONE ACETONIDE 0.5 % EX OINT: 1.0000 | TOPICAL_OINTMENT | Freq: Two times a day (BID) | CUTANEOUS | 0 refills | Status: DC

## 2022-11-04 MED ORDER — METOPROLOL SUCCINATE ER 25 MG PO TB24: 25.0000 mg | ORAL_TABLET | Freq: Every day | ORAL | 0 refills | Status: DC

## 2022-11-04 MED ORDER — METFORMIN HCL 500 MG PO TABS: 500.0000 mg | ORAL_TABLET | Freq: Two times a day (BID) | ORAL | 3 refills | Status: DC

## 2022-11-04 MED ORDER — OMEPRAZOLE 40 MG PO CPDR: 40.0000 mg | DELAYED_RELEASE_CAPSULE | Freq: Every day | ORAL | 0 refills | Status: DC

## 2022-11-04 NOTE — Patient Instructions (Signed)

## 2022-11-04 NOTE — Addendum Note (Signed)
Addended by: Chevis Pretty on: 11/04/2022 12:14 PM   Modules accepted: Orders

## 2022-11-04 NOTE — Progress Notes (Signed)
Subjective:    Patient ID: Norma Barton, female    DOB: [birth year], 88 y.o.   MRN: [id number]   Chief Complaint: medical management of chronic issues     HPI:  Norma Barton is a 88 y.o. who identifies as a female who was assigned female at birth.   Social history: Lives with: lives by herself- family checks on her daily Work history: retired   Scientist, forensic in today for follow up of the following chronic medical issues:  1. Primary hypertension No c/o chest pain, sob or headache. Does not check blood pressure at home. BP Readings from Last 3 Encounters:  10/14/22 106/72  08/19/22 123/78  05/03/22 127/77     2. Paroxysmal atrial fibrillation (HCC) Is on eliquis and is doing well. No bleeding effects. She Denies palpations or heart racing.  3. Hypokalemia No c/o muscle cramps Lab Results  Component Value Date   K 4.2 05/03/2022     4. Diabetes mellitus without complication ([place]) She does not check blood sugars at home. On no diabetic medication. Is doing diet control. Lab Results  Component Value Date   HGBA1C 6.7 (H) 05/03/2022     5. Gastroesophageal reflux disease without esophagitis Is on omperazole daily and is doing well  6. Peripheral edema Has some edema daily but resolves usually at night  7. Primary insomnia Wakes up frequently. Takes cat naps during the day  8. BMI 30.0-30.9,adult No recent weight changes Wt Readings from Last 3 Encounters:  11/04/22 161 lb (73 kg)  10/14/22 165 lb 12.8 oz (75.2 kg)  05/03/22 160 lb (72.6 kg)   BMI Readings from Last 3 Encounters:  11/04/22 29.45 kg/m  10/14/22 30.33 kg/m  05/03/22 29.26 kg/m     New complaints: None today  No Known Allergies Outpatient Encounter Medications as of 11/04/2022  Medication Sig   acetaminophen (TYLENOL) 500 MG tablet Take 1,000 mg by mouth every 6 (six) hours as needed for mild pain.   apixaban (ELIQUIS) 5 MG TABS tablet Take 1 tablet (5 mg total) by mouth 2 (two)  times daily.   cholecalciferol (VITAMIN D) 25 MCG (1000 UNIT) tablet Take 2,000 Units by mouth in the morning.   docusate sodium (COLACE) 100 MG capsule Take 1 capsule (100 mg total) by mouth 2 (two) times daily.   furosemide (LASIX) 40 MG tablet Take 1 tablet (40 mg total) by mouth daily.   metoprolol succinate (TOPROL-XL) 25 MG 24 hr tablet TAKE 1 TABLET (25 MG TOTAL) BY MOUTH DAILY.   omeprazole (PRILOSEC) 40 MG capsule Take 1 capsule (40 mg total) by mouth daily.   polyethylene glycol (MIRALAX / GLYCOLAX) 17 g packet Take 17 g by mouth daily as needed for mild constipation.   potassium chloride SA (KLOR-CON M) 20 MEQ tablet Take 1 tablet (20 mEq total) by mouth daily.   triamcinolone cream (KENALOG) 0.1 % APPLY TO AFFECTED AREA TWICE A DAY   No facility-administered encounter medications on file as of 11/04/2022.    Past Surgical History:  Procedure Laterality Date   ABDOMINAL HYSTERECTOMY     arthroscopy right knee     TOTAL KNEE ARTHROPLASTY Left 08/15/2016   Procedure: TOTAL KNEE ARTHROPLASTY;  Surgeon: Carole Civil, MD;  Location: [facility];  Service: Orthopedics;  Laterality: Left;   TOTAL KNEE ARTHROPLASTY Right 01/01/2022   Procedure: TOTAL KNEE ARTHROPLASTY;  Surgeon: Carole Civil, MD;  Location: [facility];  Service: Orthopedics;  Laterality: Right;    Family  History  Problem Relation Age of Onset   Parkinson's disease Father    Breast cancer Sister       Controlled substance contract: n/a     Review of Systems  Constitutional:  Negative for diaphoresis.  Eyes:  Negative for pain.  Respiratory:  Negative for shortness of breath.   Cardiovascular:  Negative for chest pain, palpitations and leg swelling.  Gastrointestinal:  Negative for abdominal pain.  Endocrine: Negative for polydipsia.  Skin:  Negative for rash.  Neurological:  Negative for dizziness, weakness and headaches.  Hematological:  Does not bruise/bleed easily.  All other systems reviewed and  are negative.      Objective:   Physical Exam Vitals and nursing note reviewed.  Constitutional:      General: She is not in acute distress.    Appearance: Normal appearance. She is well-developed.  HENT:     Head: Normocephalic.     Right Ear: Tympanic membrane normal.     Left Ear: Tympanic membrane normal.     Nose: Nose normal.     Mouth/Throat:     Mouth: Mucous membranes are moist.  Eyes:     Pupils: Pupils are equal, round, and reactive to light.  Neck:     Vascular: No carotid bruit or JVD.  Cardiovascular:     Rate and Rhythm: Normal rate and regular rhythm.     Heart sounds: Normal heart sounds.  Pulmonary:     Effort: Pulmonary effort is normal. No respiratory distress.     Breath sounds: Normal breath sounds. No wheezing or rales.  Chest:     Chest wall: No tenderness.  Abdominal:     General: Bowel sounds are normal. There is no distension or abdominal bruit.     Palpations: Abdomen is soft. There is no hepatomegaly, splenomegaly, mass or pulsatile mass.     Tenderness: There is no abdominal tenderness.  Musculoskeletal:        General: Normal range of motion.     Cervical back: Normal range of motion and neck supple.     Right lower leg: Edema (1+) present.     Left lower leg: Edema (1+) present.  Lymphadenopathy:     Cervical: No cervical adenopathy.  Skin:    General: Skin is warm and dry.  Neurological:     Mental Status: She is alert and oriented to person, place, and time.     Deep Tendon Reflexes: Reflexes are normal and symmetric.  Psychiatric:        Behavior: Behavior normal.        Thought Content: Thought content normal.        Judgment: Judgment normal.    BP 124/76   Pulse 76   Temp 97.6 F (36.4 C) (Temporal)   Resp 20   Ht 5' 2"$  (1.575 m)   Wt 161 lb (73 kg)   SpO2 96%   BMI 29.45 kg/m   HGBA1C 7.7%      Assessment & Plan:   Norma Barton comes in today with chief complaint of Medical Management of Chronic  Issues   Diagnosis and orders addressed:  1. Primary hypertension Low sodium diet - CMP14+EGFR - CBC with Differential/Platelet - Lipid panel  2. Paroxysmal atrial fibrillation (HCC) Avoid caffeine - Magnesium - metoprolol succinate (TOPROL-XL) 25 MG 24 hr tablet; Take 1 tablet (25 mg total) by mouth daily.  Dispense: 90 tablet; Refill: 0 - apixaban (ELIQUIS) 5 MG TABS tablet; Take 1 tablet (5  mg total) by mouth 2 (two) times daily.  Dispense: 60 tablet; Refill: 6  3. Hypokalemia Labs pending - potassium chloride SA (KLOR-CON M) 20 MEQ tablet; Take 1 tablet (20 mEq total) by mouth daily.  Dispense: 90 tablet; Refill: 1  4. Diabetes mellitus without complication (HCC) Added metformin BID Watch carbs in diet - Bayer DCA Hb A1c Waived - Microalbumin / creatinine urine ratio - metFORMIN (GLUCOPHAGE) 500 MG tablet; Take 1 tablet (500 mg total) by mouth 2 (two) times daily with a meal.  Dispense: 180 tablet; Refill: 3  5. Gastroesophageal reflux disease without esophagitis Avoid spicy foods Do not eat 2 hours prior to bedtime  - omeprazole (PRILOSEC) 40 MG capsule; Take 1 capsule (40 mg total) by mouth daily.  Dispense: 90 capsule; Refill: 0  6. Peripheral edema Elevate legs when sitting - furosemide (LASIX) 40 MG tablet; Take 1 tablet (40 mg total) by mouth daily.  Dispense: 30 tablet; Refill: 6  7. Primary insomnia Bedtime routine  8. BMI 30.0-30.9,adult Discussed diet and exercise for person with BMI >25 Will recheck weight in 3-6 months    Labs pending Health Maintenance reviewed Diet and exercise encouraged  Follow up plan: 3 months   [name] Done, FNP

## 2022-11-04 NOTE — Telephone Encounter (Signed)
Patient notified of Hgb A1c result. Also sent in a blood sugar monitor to pharmacy per patients request

## 2022-11-05 LAB — CMP14+EGFR
ALT: 25 IU/L (ref 0–32)
AST: 51 IU/L — ABNORMAL HIGH (ref 0–40)
Albumin/Globulin Ratio: 1.4 (ref 1.2–2.2)
Albumin: 3.8 g/dL (ref 3.7–4.7)
Alkaline Phosphatase: 104 IU/L (ref 44–121)
BUN/Creatinine Ratio: 15 (ref 12–28)
BUN: 14 mg/dL (ref 8–27)
Bilirubin Total: 0.5 mg/dL (ref 0.0–1.2)
CO2: 25 mmol/L (ref 20–29)
Calcium: 9.3 mg/dL (ref 8.7–10.3)
Chloride: 100 mmol/L (ref 96–106)
Creatinine, Ser: 0.92 mg/dL (ref 0.57–1.00)
Globulin, Total: 2.7 g/dL (ref 1.5–4.5)
Glucose: 254 mg/dL — ABNORMAL HIGH (ref 70–99)
Potassium: 3.8 mmol/L (ref 3.5–5.2)
Sodium: 143 mmol/L (ref 134–144)
Total Protein: 6.5 g/dL (ref 6.0–8.5)
eGFR: 61 mL/min/{1.73_m2} (ref >59)

## 2022-11-05 LAB — CBC WITH DIFFERENTIAL/PLATELET
Basophils Absolute: 0 10*3/uL (ref 0.0–0.2)
Basos: 1 %
EOS (ABSOLUTE): 0.1 10*3/uL (ref 0.0–0.4)
Eos: 3 %
Hematocrit: 45.4 % (ref 34.0–46.6)
Hemoglobin: 15 g/dL (ref 11.1–15.9)
Immature Grans (Abs): 0 10*3/uL (ref 0.0–0.1)
Immature Granulocytes: 0 %
Lymphocytes Absolute: 1.8 10*3/uL (ref 0.7–3.1)
Lymphs: 43 %
MCH: 31.9 pg (ref 26.6–33.0)
MCHC: 33 g/dL (ref 31.5–35.7)
MCV: 97 fL (ref 79–97)
Monocytes Absolute: 0.3 10*3/uL (ref 0.1–0.9)
Monocytes: 6 %
Neutrophils Absolute: 2 10*3/uL (ref 1.4–7.0)
Neutrophils: 47 %
Platelets: 131 10*3/uL — ABNORMAL LOW (ref 150–450)
RBC: 4.7 x10E6/uL (ref 3.77–5.28)
RDW: 12.2 % (ref 11.7–15.4)
WBC: 4.2 10*3/uL (ref 3.4–10.8)

## 2022-11-05 LAB — LIPID PANEL
Chol/HDL Ratio: 3 ratio (ref 0.0–4.4)
Cholesterol, Total: 121 mg/dL (ref 100–199)
HDL: 41 mg/dL (ref >39)
LDL Chol Calc (NIH): 47 mg/dL (ref 0–99)
Triglycerides: 207 mg/dL — ABNORMAL HIGH (ref 0–149)
VLDL Cholesterol Cal: 33 mg/dL (ref 5–40)

## 2022-11-05 LAB — MICROALBUMIN / CREATININE URINE RATIO
Creatinine, Urine: 26.4 mg/dL
Microalb/Creat Ratio: 13 mg/g creat (ref 0–29)
Microalbumin, Urine: 3.3 ug/mL

## 2022-11-05 LAB — BASIC METABOLIC PANEL
BUN/Creatinine Ratio: 15 (ref 12–28)
BUN: 13 mg/dL (ref 8–27)
CO2: 26 mmol/L (ref 20–29)
Calcium: 9 mg/dL (ref 8.7–10.3)
Chloride: 98 mmol/L (ref 96–106)
Creatinine, Ser: 0.86 mg/dL (ref 0.57–1.00)
Glucose: 253 mg/dL — ABNORMAL HIGH (ref 70–99)
Potassium: 3.7 mmol/L (ref 3.5–5.2)
Sodium: 141 mmol/L (ref 134–144)
eGFR: 66 mL/min/{1.73_m2} (ref >59)

## 2022-11-05 LAB — MAGNESIUM: Magnesium: 1.6 mg/dL (ref 1.6–2.3)

## 2022-11-06 ENCOUNTER — Ambulatory Visit (INDEPENDENT_AMBULATORY_CARE_PROVIDER_SITE_OTHER): Payer: Medicare HMO

## 2022-11-06 VITALS — Ht 62.0 in | Wt 161.0 lb

## 2022-11-06 DIAGNOSIS — Z0000 Encounter for general adult medical examination without abnormal findings: Secondary | ICD-10-CM | POA: Diagnosis not present

## 2022-11-06 LAB — SPECIMEN STATUS REPORT

## 2022-11-06 LAB — MAGNESIUM: Magnesium: 1.5 mg/dL — ABNORMAL LOW (ref 1.6–2.3)

## 2022-11-06 NOTE — Patient Instructions (Signed)
[name] , Thank you for taking time to come for your Medicare Wellness Visit. I appreciate your ongoing commitment to your health goals. Please review the following plan we discussed and let me know if I can assist you in the future.   These are the goals we discussed:  Goals      Patient Stated     01/31/2020 AWV Goal: Keep All Scheduled Appointments  Over the next year, patient will attend all scheduled appointments with their PCP and any specialists that they see.         This is a list of the screening recommended for you and due dates:  Health Maintenance  Topic Date Due   Eye exam for diabetics  Never done   DTaP/Tdap/Td vaccine (1 - Tdap) Never done   COVID-19 Vaccine (3 - Moderna risk series) 11/20/2022*   Zoster (Shingles) Vaccine (1 of 2) 02/02/2023*   DEXA scan (bone density measurement)  05/04/2023*   Pneumonia Vaccine (1 of 1 - PCV) 11/05/2023*   Complete foot exam   05/04/2023   Hemoglobin A1C  05/05/2023   Yearly kidney function blood test for diabetes  11/05/2023   Yearly kidney health urinalysis for diabetes  11/05/2023   Medicare Annual Wellness Visit  11/07/2023   Flu Shot  Completed   HPV Vaccine  Aged Out  *Topic was postponed. The date shown is not the original due date.    Advanced directives: Advance directive discussed with you today. I have provided a copy for you to complete at home and have notarized. Once this is complete please bring a copy in to our office so we can scan it into your chart.   Conditions/risks identified: Aim for 30 minutes of exercise or brisk walking, 6-8 glasses of water, and 5 servings of fruits and vegetables each day.   Next appointment: Follow up in one year for your annual wellness visit    Preventive Care 65 Years and Older, Female Preventive care refers to lifestyle choices and visits with your health care provider that can promote health and wellness. What does preventive care include? A yearly physical exam.  This is also called an annual well check. Dental exams once or twice a year. Routine eye exams. Ask your health care provider how often you should have your eyes checked. Personal lifestyle choices, including: Daily care of your teeth and gums. Regular physical activity. Eating a healthy diet. Avoiding tobacco and drug use. Limiting alcohol use. Practicing safe sex. Taking low-dose aspirin every day. Taking vitamin and mineral supplements as recommended by your health care provider. What happens during an annual well check? The services and screenings done by your health care provider during your annual well check will depend on your age, overall health, lifestyle risk factors, and family history of disease. Counseling  Your health care provider may ask you questions about your: Alcohol use. Tobacco use. Drug use. Emotional well-being. Home and relationship well-being. Sexual activity. Eating habits. History of falls. Memory and ability to understand (cognition). Work and work Statistician. Reproductive health. Screening  You may have the following tests or measurements: Height, weight, and BMI. Blood pressure. Lipid and cholesterol levels. These may be checked every 5 years, or more frequently if you are over 29 years old. Skin check. Lung cancer screening. You may have this screening every year starting at age 52 if you have a 30-pack-year history of smoking and currently smoke or have quit within the past 15 years. Fecal occult blood  test (FOBT) of the stool. You may have this test every year starting at age 57. Flexible sigmoidoscopy or colonoscopy. You may have a sigmoidoscopy every 5 years or a colonoscopy every 10 years starting at age 55. Hepatitis C blood test. Hepatitis B blood test. Sexually transmitted disease (STD) testing. Diabetes screening. This is done by checking your blood sugar (glucose) after you have not eaten for a while (fasting). You may have this done  every 1-3 years. Bone density scan. This is done to screen for osteoporosis. You may have this done starting at age 48. Mammogram. This may be done every 1-2 years. Talk to your health care provider about how often you should have regular mammograms. Talk with your health care provider about your test results, treatment options, and if necessary, the need for more tests. Vaccines  Your health care provider may recommend certain vaccines, such as: Influenza vaccine. This is recommended every year. Tetanus, diphtheria, and acellular pertussis (Tdap, Td) vaccine. You may need a Td booster every 10 years. Zoster vaccine. You may need this after age 24. Pneumococcal 13-valent conjugate (PCV13) vaccine. One dose is recommended after age 72. Pneumococcal polysaccharide (PPSV23) vaccine. One dose is recommended after age 8. Talk to your health care provider about which screenings and vaccines you need and how often you need them. This information is not intended to replace advice given to you by your health care provider. Make sure you discuss any questions you have with your health care provider. Document Released: 09/29/2015 Document Revised: 05/22/2016 Document Reviewed: 07/04/2015 Elsevier Interactive Patient Education  2017 [place] Prevention in the Home Falls can cause injuries. They can happen to people of all ages. There are many things you can do to make your home safe and to help prevent falls. What can I do on the outside of my home? Regularly fix the edges of walkways and driveways and fix any cracks. Remove anything that might make you trip as you walk through a door, such as a raised step or threshold. Trim any bushes or trees on the path to your home. Use bright outdoor lighting. Clear any walking paths of anything that might make someone trip, such as rocks or tools. Regularly check to see if handrails are loose or broken. Make sure that both sides of any steps have  handrails. Any raised decks and porches should have guardrails on the edges. Have any leaves, snow, or ice cleared regularly. Use sand or salt on walking paths during winter. Clean up any spills in your garage right away. This includes oil or grease spills. What can I do in the bathroom? Use night lights. Install grab bars by the toilet and in the tub and shower. Do not use towel bars as grab bars. Use non-skid mats or decals in the tub or shower. If you need to sit down in the shower, use a plastic, non-slip stool. Keep the floor dry. Clean up any water that spills on the floor as soon as it happens. Remove soap buildup in the tub or shower regularly. Attach bath mats securely with double-sided non-slip rug tape. Do not have throw rugs and other things on the floor that can make you trip. What can I do in the bedroom? Use night lights. Make sure that you have a light by your bed that is easy to reach. Do not use any sheets or blankets that are too big for your bed. They should not hang down onto the floor. Have  a firm chair that has side arms. You can use this for support while you get dressed. Do not have throw rugs and other things on the floor that can make you trip. What can I do in the kitchen? Clean up any spills right away. Avoid walking on wet floors. Keep items that you use a lot in easy-to-reach places. If you need to reach something above you, use a strong step stool that has a grab bar. Keep electrical cords out of the way. Do not use floor polish or wax that makes floors slippery. If you must use wax, use non-skid floor wax. Do not have throw rugs and other things on the floor that can make you trip. What can I do with my stairs? Do not leave any items on the stairs. Make sure that there are handrails on both sides of the stairs and use them. Fix handrails that are broken or loose. Make sure that handrails are as long as the stairways. Check any carpeting to make sure  that it is firmly attached to the stairs. Fix any carpet that is loose or worn. Avoid having throw rugs at the top or bottom of the stairs. If you do have throw rugs, attach them to the floor with carpet tape. Make sure that you have a light switch at the top of the stairs and the bottom of the stairs. If you do not have them, ask someone to add them for you. What else can I do to help prevent falls? Wear shoes that: Do not have high heels. Have rubber bottoms. Are comfortable and fit you well. Are closed at the toe. Do not wear sandals. If you use a stepladder: Make sure that it is fully opened. Do not climb a closed stepladder. Make sure that both sides of the stepladder are locked into place. Ask someone to hold it for you, if possible. Clearly mark and make sure that you can see: Any grab bars or handrails. First and last steps. Where the edge of each step is. Use tools that help you move around (mobility aids) if they are needed. These include: Canes. Walkers. Scooters. Crutches. Turn on the lights when you go into a dark area. Replace any light bulbs as soon as they burn out. Set up your furniture so you have a clear path. Avoid moving your furniture around. If any of your floors are uneven, fix them. If there are any pets around you, be aware of where they are. Review your medicines with your doctor. Some medicines can make you feel dizzy. This can increase your chance of falling. Ask your doctor what other things that you can do to help prevent falls. This information is not intended to replace advice given to you by your health care provider. Make sure you discuss any questions you have with your health care provider. Document Released: 06/29/2009 Document Revised: 02/08/2016 Document Reviewed: 10/07/2014 Elsevier Interactive Patient Education  2017 [employer].

## 2022-11-06 NOTE — Progress Notes (Signed)
Subjective:   Norma Barton is a 88 y.o. female who presents for Medicare Annual (Subsequent) preventive examination.  I connected with  Padma Herkert Baik on 11/06/22 by a audio enabled telemedicine application and verified that I am speaking with the correct person using two identifiers.  Patient Location: Home  Provider Location: Home Office  I discussed the limitations of evaluation and management by telemedicine. The patient expressed understanding and agreed to proceed.  Review of Systems     Cardiac Risk Factors include: dyslipidemia;diabetes mellitus;advanced age (>39mn, >>33women);hypertension;sedentary lifestyle     Objective:    Today's Vitals   11/06/22 1447  Weight: 161 lb (73 kg)  Height: '5\' 2"'$  (1.575 m)   Body mass index is 29.45 kg/m.     11/06/2022    2:55 PM 01/01/2022    4:00 PM 01/01/2022    8:08 AM 12/28/2021    1:33 PM 11/05/2021    2:55 PM 04/13/2021   11:52 AM 01/31/2020    1:35 PM  Advanced Directives  Does Patient Have a Medical Advance Directive? No  No No No No No  Would patient like information on creating a medical advance directive? Yes (MAU/Ambulatory/Procedural Areas - Information given) No - Patient declined  No - Patient declined No - Patient declined No - Patient declined No - Patient declined    Current Medications (verified) Outpatient Encounter Medications as of 11/06/2022  Medication Sig   acetaminophen (TYLENOL) 500 MG tablet Take 1,000 mg by mouth every 6 (six) hours as needed for mild pain.   apixaban (ELIQUIS) 5 MG TABS tablet Take 1 tablet (5 mg total) by mouth 2 (two) times daily.   blood glucose meter kit and supplies Dispense based on patient and insurance preference. Use up to four times daily as directed. (FOR ICD-10 E10.9, E11.9).   cholecalciferol (VITAMIN D) 25 MCG (1000 UNIT) tablet Take 2,000 Units by mouth in the morning.   docusate sodium (COLACE) 100 MG capsule Take 1 capsule (100 mg total) by mouth 2 (two) times  daily.   furosemide (LASIX) 40 MG tablet Take 1 tablet (40 mg total) by mouth daily.   metFORMIN (GLUCOPHAGE) 500 MG tablet Take 1 tablet (500 mg total) by mouth 2 (two) times daily with a meal.   metoprolol succinate (TOPROL-XL) 25 MG 24 hr tablet Take 1 tablet (25 mg total) by mouth daily.   omeprazole (PRILOSEC) 40 MG capsule Take 1 capsule (40 mg total) by mouth daily.   polyethylene glycol (MIRALAX / GLYCOLAX) 17 g packet Take 17 g by mouth daily as needed for mild constipation.   potassium chloride SA (KLOR-CON M) 20 MEQ tablet Take 1 tablet (20 mEq total) by mouth daily.   triamcinolone ointment (KENALOG) 0.5 % Apply 1 Application topically 2 (two) times daily.   No facility-administered encounter medications on file as of 11/06/2022.    Allergies (verified) Patient has no known allergies.   History: Past Medical History:  Diagnosis Date   Arthritis    Diabetes mellitus without complication ([place]    GERD (gastroesophageal reflux disease)    Hypertension    Past Surgical History:  Procedure Laterality Date   ABDOMINAL HYSTERECTOMY     arthroscopy right knee     TOTAL KNEE ARTHROPLASTY Left 08/15/2016   Procedure: TOTAL KNEE ARTHROPLASTY;  Surgeon: SCarole Civil MD;  Location: [facility];  Service: Orthopedics;  Laterality: Left;   TOTAL KNEE ARTHROPLASTY Right 01/01/2022   Procedure: TOTAL KNEE ARTHROPLASTY;  Surgeon:  Carole Civil, MD;  Location: [facility];  Service: Orthopedics;  Laterality: Right;   Family History  Problem Relation Age of Onset   Parkinson's disease Father    Breast cancer Sister    Social History   Socioeconomic History   Marital status: Widowed    Spouse name: Not on file   Number of children: 5   Years of education: 39   Highest education level: 12th grade  Occupational History   Occupation: retired  Tobacco Use   Smoking status: Never   Smokeless tobacco: Never  Substance and Sexual Activity   Alcohol use: No   Drug use: No    Sexual activity: Not Currently  Other Topics Concern   Not on file  Social History Narrative   Lives alone in apartment   Has 5 sons - they all live within 1 miles or so   Had one daughter who died right after birth (cord wrapped around neck at birth)   Social Determinants of Health   Financial Resource Strain: [place]  (11/06/2022)   Overall Financial Resource Strain (CARDIA)    Difficulty of Paying Living Expenses: Not hard at all  Food Insecurity: No Food Insecurity (11/06/2022)   Hunger Vital Sign    Worried About Running Out of Food in the Last Year: Never true    [place] in the Last Year: Never true  Transportation Needs: No Transportation Needs (11/06/2022)   PRAPARE - Hydrologist (Medical): No    Lack of Transportation (Non-Medical): No  Physical Activity: Inactive (11/06/2022)   Exercise Vital Sign    Days of Exercise per Week: 0 days    Minutes of Exercise per Session: 0 min  Stress: No Stress Concern Present (11/06/2022)   [place]    Feeling of Stress : Not at all  Social Connections: Moderately Isolated (11/06/2022)   Social Connection and Isolation Panel [NHANES]    Frequency of Communication with Friends and Family: More than three times a week    Frequency of Social Gatherings with Friends and Family: More than three times a week    Attends Religious Services: Never    Marine scientist or Organizations: Yes    Attends Music therapist: More than 4 times per year    Marital Status: Widowed    Tobacco Counseling Counseling given: Not Answered   Clinical Intake:  Pre-visit preparation completed: Yes  Pain : No/denies pain  Diabetes: Yes CBG done?: No Did pt. bring in CBG monitor from home?: No  How often do you need to have someone help you when you read instructions, pamphlets, or other written materials from your doctor or  pharmacy?: 1 - Never  Diabetic?Yes  Nutrition Risk Assessment:  Has the patient had any N/V/D within the last 2 months?  No  Does the patient have any non-healing wounds?  No  Has the patient had any unintentional weight loss or weight gain?  No   Diabetes:  Is the patient diabetic?  Yes  If diabetic, was a CBG obtained today?  No  Did the patient bring in their glucometer from home?  No  How often do you monitor your CBG's? Recently received meter.   Financial Strains and Diabetes Management:  Are you having any financial strains with the device, your supplies or your medication? No .  Does the patient want to be seen by Chronic Care  Management for management of their diabetes?  No  Would the patient like to be referred to a Nutritionist or for Diabetic Management?  No   Diabetic Exams:  Diabetic Eye Exam: Overdue for diabetic eye exam. Pt has been advised about the importance in completing this exam. Patient advised to call and schedule an eye exam. Diabetic Foot Exam: Completed 05/03/22   Interpreter Needed?: No  Information entered by :: Norma Barton   Activities of Daily Living    11/06/2022    3:00 PM 01/01/2022    4:00 PM  In your present state of health, do you have any difficulty performing the following activities:  Hearing? 0 0  Vision? 0 0  Difficulty concentrating or making decisions? 0 0  Walking or climbing stairs? 0 1  Dressing or bathing? 0 0  Doing errands, shopping? 0 0  Preparing Food and eating ? N   Using the Toilet? N   In the past six months, have you accidently leaked urine? N   Do you have problems with loss of bowel control? N   Managing your Medications? N   Managing your Finances? N   Housekeeping or managing your Housekeeping? N     Patient Care Team: Chevis Pretty, FNP as PCP - General (Nurse Practitioner) Arnoldo Lenis, MD as PCP - Cardiology (Cardiology) Shea Evans Norva Riffle, LCSW as Social Worker (Licensed  Clinical Social Worker) [place] Comment, Allison Quarry, MD as Referring Physician (Optometry) Carole Civil, MD as Consulting Physician (Orthopedic Surgery)  Indicate any recent Medical Services you may have received from other than [facility] providers in the past year (date may be approximate).     Assessment:   This is a routine wellness examination for Norma Barton.  Hearing/Vision screen Hearing Screening - Comments:: Denies hearing difficulties  Vision Screening - Comments:: Wears rx glasses - up to date with routine eye exams with [facility]    Dietary issues and exercise activities discussed: Current Exercise Habits: The patient does not participate in regular exercise at present   Goals Addressed               This Visit's Progress     COMPLETED: Client will talk with LCSW in next 4 weeks about health needs of client and managing health needs of client (pt-stated)        CARE PLAN ENTRY   Current Barriers:  Patient with chronic diagnoses of HTN, OA, GERD, Insomnia, DM Occasional Edema in legs  Clinical Social Work Clinical Goal(s):  LCSW will call client in next 4 weeks to talk with client about health needs of client and managing health needs of client  Interventions: Talked with client about CCM program support Talked with client about social work needs of client Talked with client about social support network (sons are supportive) Talked with client about mobility issues of client (she uses a cane to help her walk) Talked with client about appetite of client Talked with client about vision issues of client (uses reading glasses) Talked with client about pain issues of client Talked with client about occasional swelling in her legs Talked with client about transport needs of client Talked with client about her managing of Diabetes Talked with client about relaxation techniques (watches TV) Talked with Norma Barton about ADLs completion Encouraged Norma Barton to call RNCM as  needed for nursing support  Patient Self Care Activities:   Attends medical appointments  Patient Self Care Deficits:    Occasional swelling in legs  Initial goal documentation       COMPLETED: Manage medical needs faced and complete ADLs daily. Protect My Health (Patient) (pt-stated)        Timeframe:  Short-Term Goal Priority:  Medium Progress: On Track Start Date:       12/03/21                       Expected End Date:       02/28/22                Follow Up Date 01/28/22 at 3:00 PM   Protect My Health (Patient) Manage health needs of client and manage daily activities as needed    Why is this important?   Screening tests can find diseases early when they are easier to treat.  Your doctor or nurse will talk with you about which tests are important for you.  Getting shots for common diseases like the flu and shingles will help prevent them.      Patient Strengths:  Takes medications as prescribed Attends scheduled medical appointments Has support from her two sons Communicates well with others  Patient Deficits:  Challenges in managing medical needs Pain issues Mobility issues  Patient Goals:   Attend scheduled medical appointments Take medications as prescribed Call RNCM or LCSW as needed for support Call medical provider as needed for information about managing client medical issues faced -  Follow Up Plan: LCSW to call client or son of client on  01/28/22 at 3:00 PM to assess client needs       COMPLETED: Manage My Emotions         Depression Screen    11/06/2022    3:01 PM 11/04/2022   11:45 AM 05/03/2022   11:58 AM 04/17/2022    2:05 PM 01/29/2022    2:27 PM 11/05/2021    2:54 PM 11/02/2021   11:59 AM  PHQ 2/9 Scores  PHQ - 2 Score 0 0 0 0 0 0 0  PHQ- 9 Score  0 0  0 0 0    Fall Risk    11/06/2022    2:49 PM 11/04/2022   11:45 AM 05/03/2022   11:58 AM 04/17/2022    2:09 PM 04/17/2022    2:05 PM  [place] in the past year? 0 0 0 0 0   Number falls in past yr: 0   0   Injury with Fall? 0   0   Follow up Falls prevention discussed;Education provided;Falls evaluation completed        FALL RISK PREVENTION PERTAINING TO THE HOME:  Any stairs in or around the home? No  If so, are there any without handrails? No  Home free of loose throw rugs in walkways, pet beds, electrical cords, etc? Yes  Adequate lighting in your home to reduce risk of falls? Yes   ASSISTIVE DEVICES UTILIZED TO PREVENT FALLS:  Life alert? No  Use of a cane, walker or w/c? No  Grab bars in the bathroom? Yes  Shower chair or bench in shower? No  Elevated toilet seat or a handicapped toilet? Yes   TIMED UP AND GO:  Was the test performed? No . Telephonic visit   Cognitive Function:        11/06/2022    3:01 PM 01/31/2020    1:38 PM  6CIT Screen  What Year? 0 points 0 points  What month? 0 points 0 points  What time?  0 points 0 points  Count back from 20 0 points 0 points  Months in reverse 0 points 0 points  Repeat phrase 0 points 0 points  Total Score 0 points 0 points    Immunizations Immunization History  Administered Date(s) Administered   Fluad Quad(high Dose 65+) 07/23/2019, 10/23/2020, 05/31/2021, 09/18/2022   Influenza, High Dose Seasonal PF 08/21/2017, 07/14/2018   Influenza,inj,Quad PF,6+ Mos 06/25/2016   Moderna Sars-Covid-2 Vaccination 04/25/2020, 05/23/2020    TDAP status: Due, Education has been provided regarding the importance of this vaccine. Advised may receive this vaccine at local pharmacy or Health Dept. Aware to provide a copy of the vaccination record if obtained from local pharmacy or Health Dept. Verbalized acceptance and understanding.  Flu Vaccine status: Up to date  Pneumococcal vaccine status: Due, Education has been provided regarding the importance of this vaccine. Advised may receive this vaccine at local pharmacy or Health Dept. Aware to provide a copy of the vaccination record if obtained from  local pharmacy or Health Dept. Verbalized acceptance and understanding.  Covid-19 vaccine status: Information provided on how to obtain vaccines.   Qualifies for Shingles Vaccine? Yes   Zostavax completed No   Shingrix Completed?: No.    Education has been provided regarding the importance of this vaccine. Patient has been advised to call insurance company to determine out of pocket expense if they have not yet received this vaccine. Advised may also receive vaccine at local pharmacy or Health Dept. Verbalized acceptance and understanding.  Screening Tests Health Maintenance  Topic Date Due   OPHTHALMOLOGY EXAM  Never done   DTaP/Tdap/Td (1 - Tdap) Never done   COVID-19 Vaccine (3 - Moderna risk series) 11/20/2022 (Originally 06/20/2020)   Zoster Vaccines- Shingrix (1 of 2) 02/02/2023 (Originally 02/23/1954)   DEXA SCAN  05/04/2023 (Originally 03/10/2022)   Pneumonia Vaccine 57+ Years old (1 of 1 - PCV) 11/05/2023 (Originally 02/22/2002)   FOOT EXAM  05/04/2023   HEMOGLOBIN A1C  05/05/2023   Diabetic kidney evaluation - eGFR measurement  11/05/2023   Diabetic kidney evaluation - Urine ACR  11/05/2023   Medicare Annual Wellness (AWV)  11/07/2023   INFLUENZA VACCINE  Completed   HPV VACCINES  Aged Out    Health Maintenance  Health Maintenance Due  Topic Date Due   OPHTHALMOLOGY EXAM  Never done   DTaP/Tdap/Td (1 - Tdap) Never done    Colorectal cancer screening: No longer required.   Mammogram status: No longer required due to age.  Bone Density status: Declines at this time   Lung Cancer Screening: (Low Dose CT Chest recommended if Age 55-80 years, 30 pack-year currently smoking OR have quit w/in 15years.) does not qualify.   Lung Cancer Screening Referral: n/a  Additional Screening:  Hepatitis C Screening: does not qualify  Vision Screening: Recommended annual ophthalmology exams for early detection of glaucoma and other disorders of the eye. Is the patient up to date with  their annual eye exam?  No  Who is the provider or what is the name of the office in which the patient attends annual eye exams? [facility] If pt is not established with a provider, would they like to be referred to a provider to establish care? No .   Dental Screening: Recommended annual dental exams for proper oral hygiene  Community Resource Referral / Chronic Care Management: CRR required this visit?  No   CCM required this visit?  No      Plan:  I have personally reviewed and noted the following in the patient's chart:   Medical and social history Use of alcohol, tobacco or illicit drugs  Current medications and supplements including opioid prescriptions. Patient is not currently taking opioid prescriptions. Functional ability and status Nutritional status Physical activity Advanced directives List of other physicians Hospitalizations, surgeries, and ER visits in previous 12 months Vitals Screenings to include cognitive, depression, and falls Referrals and appointments  In addition, I have reviewed and discussed with patient certain preventive protocols, quality metrics, and best practice recommendations. A written personalized care plan for preventive services as well as general preventive health recommendations were provided to patient.     Norma Barton, [place]   [id number]   Due to this being a virtual visit, the after visit summary with patients personalized plan was offered to patient via mail or my-chart.  per request, patient was mailed a copy of AVS  Nurse Notes: No concerns

## 2022-11-07 ENCOUNTER — Other Ambulatory Visit: Payer: Self-pay

## 2022-11-07 ENCOUNTER — Telehealth: Payer: Self-pay | Admitting: Nurse Practitioner

## 2022-11-07 MED ORDER — BLOOD GLUCOSE METER KIT: PACK | 0 refills | Status: AC

## 2022-11-07 NOTE — Telephone Encounter (Signed)
Refaxed rx to [facility] per patients request

## 2022-11-07 NOTE — Telephone Encounter (Signed)
Pharmacy told patient that they did not get the prescription for blood glucose meter kit and supplies and it needs to be resent.

## 2022-11-11 ENCOUNTER — Telehealth: Payer: Self-pay | Admitting: Nurse Practitioner

## 2022-11-11 MED ORDER — DAPAGLIFLOZIN PROPANEDIOL 5 MG PO TABS: 5.0000 mg | ORAL_TABLET | Freq: Every day | ORAL | 2 refills | Status: DC

## 2022-11-11 NOTE — Telephone Encounter (Signed)
Metformin '500mg'$  is lowest dose of metformin. Lets try farxiga- 1 tablet daily  Meds ordered this encounter  Medications   dapagliflozin propanediol (FARXIGA) 5 MG TABS tablet    Sig: Take 1 tablet (5 mg total) by mouth daily before breakfast.    Dispense:  30 tablet    Refill:  2    Order Specific Question:   Supervising Provider    Answer:   Worthy Rancher [id number]   [place], FNP

## 2022-11-11 NOTE — Telephone Encounter (Signed)
Patient notified and verbalized understanding. 

## 2022-11-11 NOTE — Telephone Encounter (Signed)
Called and spoke to patient - she states that she has had multiple headaches within the last week and that when she lays down she can feel her heart beat.  Patient denies rapid heart rate, skipping beats, chest pain, SOB.  Patient wants to know if it may be that her Metformin dose is too high

## 2022-11-11 NOTE — Telephone Encounter (Signed)
Lets try farxiga '5mg'$  1 tablet daily

## 2022-11-18 ENCOUNTER — Telehealth: Payer: Self-pay | Admitting: *Deleted

## 2022-11-18 NOTE — Telephone Encounter (Signed)
-----   Message from Arnoldo Lenis, MD sent at 11/17/2022  3:14 PM EST ----- Labs look fine  Zandra Abts MD

## 2022-11-18 NOTE — Telephone Encounter (Signed)
  Laurine Blazer, LPN [id number]  [id number] PM EST Back to Top    Notified, copy to pcp.

## 2022-11-19 ENCOUNTER — Ambulatory Visit (INDEPENDENT_AMBULATORY_CARE_PROVIDER_SITE_OTHER): Payer: Medicare HMO | Admitting: Nurse Practitioner

## 2022-11-19 ENCOUNTER — Encounter: Payer: Self-pay | Admitting: Nurse Practitioner

## 2022-11-19 VITALS — BP 133/78 | HR 68 | Temp 97.4°F | Resp 20 | Ht 62.0 in | Wt 157.0 lb

## 2022-11-19 DIAGNOSIS — K5901 Slow transit constipation: Secondary | ICD-10-CM

## 2022-11-19 DIAGNOSIS — E119 Type 2 diabetes mellitus without complications: Secondary | ICD-10-CM | POA: Diagnosis not present

## 2022-11-19 MED ORDER — DAPAGLIFLOZIN PROPANEDIOL 10 MG PO TABS: 10.0000 mg | ORAL_TABLET | Freq: Every day | ORAL | 5 refills | Status: DC

## 2022-11-19 NOTE — Progress Notes (Signed)
Subjective:    Patient ID: Norma Barton, female    DOB: June 28, 1935, 88 y.o.   MRN: [id number]   Chief Complaint:   HPI New diabetic was started on metformin last week- caused palpitations and so she stopped. We sent in [place] '5mg'$  daily and she started that 2 days ago. Blood sugar fasting this morning was 179. Patient Active Problem List   Diagnosis Date Noted   Peripheral edema 05/03/2022   Status post total right knee replacement 01/01/22/ 03/26/2022   Osteoarthritis of right knee 01/01/2022   Paroxysmal atrial fibrillation ([place]) 11/02/2021   Diabetes mellitus without complication ([place]) [id number]   BMI 30.0-30.9,adult 01/12/2018   Gastroesophageal reflux disease without esophagitis 01/12/2018   Hypokalemia 01/12/2018   Hypertension 04/12/2013   Osteoarthritis of both knees 04/12/2013   Insomnia 11/22/2012   Cataract 08/04/2012       Review of Systems  Gastrointestinal:  Positive for constipation.       Objective:   Physical Exam Vitals and nursing note reviewed.  Constitutional:      General: She is not in acute distress.    Appearance: Normal appearance. She is well-developed.  Neck:     Vascular: No carotid bruit or JVD.  Cardiovascular:     Rate and Rhythm: Normal rate and regular rhythm.     Heart sounds: Normal heart sounds.  Pulmonary:     Effort: Pulmonary effort is normal. No respiratory distress.     Breath sounds: Normal breath sounds. No wheezing or rales.  Chest:     Chest wall: No tenderness.  Abdominal:     General: Bowel sounds are normal. There is no distension or abdominal bruit.     Palpations: Abdomen is soft. There is no hepatomegaly, splenomegaly, mass or pulsatile mass.     Tenderness: There is no abdominal tenderness.  Musculoskeletal:        General: Normal range of motion.     Cervical back: Normal range of motion and neck supple.  Lymphadenopathy:     Cervical: No cervical adenopathy.  Skin:    General: Skin is warm and dry.   Neurological:     Mental Status: She is alert and oriented to person, place, and time.     Deep Tendon Reflexes: Reflexes are normal and symmetric.  Psychiatric:        Behavior: Behavior normal.        Thought Content: Thought content normal.        Judgment: Judgment normal.    BP 133/78   Pulse 68   Temp (!) 97.4 F (36.3 C) (Temporal)   Resp 20   Ht '5\' 2"'$  (1.575 m)   Wt 157 lb (71.2 kg)   SpO2 97%   BMI 28.72 kg/m         Assessment & Plan:   [name] in today with chief complaint of Metformin makes her heart race   1. Diabetes mellitus without complication ([place]) Continue farxiga '5mg'$  daily till Sunday then increase to '10mg'$  daily Continue to watch carbs in diet Get carb counting book- no more then 50 carbs per meal and 15 carbs per 2 snacks a day.  2. Slow transit constipation Miralax daily in apple juice Increase fiber in diet   The above assessment and management plan was discussed with the patient. The patient verbalized understanding of and has agreed to the management plan. Patient is aware to call the clinic if symptoms persist or worsen. Patient is aware when  to return to the clinic for a follow-up visit. Patient educated on when it is appropriate to go to the emergency department.   [name] Done, FNP

## 2022-12-11 DIAGNOSIS — D0462 Carcinoma in situ of skin of left upper limb, including shoulder: Secondary | ICD-10-CM | POA: Diagnosis not present

## 2022-12-11 DIAGNOSIS — D0439 Carcinoma in situ of skin of other parts of face: Secondary | ICD-10-CM | POA: Diagnosis not present

## 2022-12-11 DIAGNOSIS — D0461 Carcinoma in situ of skin of right upper limb, including shoulder: Secondary | ICD-10-CM | POA: Diagnosis not present

## 2022-12-19 ENCOUNTER — Ambulatory Visit: Payer: Medicare HMO | Admitting: Nurse Practitioner

## 2022-12-20 ENCOUNTER — Encounter: Payer: Self-pay | Admitting: Nurse Practitioner

## 2022-12-31 ENCOUNTER — Encounter: Payer: Self-pay | Admitting: Nurse Practitioner

## 2022-12-31 ENCOUNTER — Ambulatory Visit (INDEPENDENT_AMBULATORY_CARE_PROVIDER_SITE_OTHER): Payer: Medicare HMO | Admitting: Nurse Practitioner

## 2022-12-31 VITALS — BP 121/74 | HR 74 | Temp 97.0°F | Resp 20 | Ht 62.0 in | Wt 152.0 lb

## 2022-12-31 DIAGNOSIS — E876 Hypokalemia: Secondary | ICD-10-CM

## 2022-12-31 DIAGNOSIS — Z6830 Body mass index (BMI) 30.0-30.9, adult: Secondary | ICD-10-CM | POA: Diagnosis not present

## 2022-12-31 DIAGNOSIS — F5101 Primary insomnia: Secondary | ICD-10-CM

## 2022-12-31 DIAGNOSIS — I480 Paroxysmal atrial fibrillation: Secondary | ICD-10-CM

## 2022-12-31 DIAGNOSIS — K219 Gastro-esophageal reflux disease without esophagitis: Secondary | ICD-10-CM | POA: Diagnosis not present

## 2022-12-31 DIAGNOSIS — R600 Localized edema: Secondary | ICD-10-CM | POA: Diagnosis not present

## 2022-12-31 DIAGNOSIS — I10 Essential (primary) hypertension: Secondary | ICD-10-CM

## 2022-12-31 DIAGNOSIS — K5901 Slow transit constipation: Secondary | ICD-10-CM

## 2022-12-31 DIAGNOSIS — E119 Type 2 diabetes mellitus without complications: Secondary | ICD-10-CM | POA: Diagnosis not present

## 2022-12-31 DIAGNOSIS — R69 Illness, unspecified: Secondary | ICD-10-CM | POA: Diagnosis not present

## 2022-12-31 LAB — BAYER DCA HB A1C WAIVED: HB A1C (BAYER DCA - WAIVED): 6.6 % — ABNORMAL HIGH (ref 4.8–5.6)

## 2022-12-31 MED ORDER — OMEPRAZOLE 40 MG PO CPDR
40.0000 mg | DELAYED_RELEASE_CAPSULE | Freq: Every day | ORAL | 1 refills | Status: DC
Start: 2022-12-31 — End: 2023-07-10

## 2022-12-31 MED ORDER — DAPAGLIFLOZIN PROPANEDIOL 5 MG PO TABS: 5.0000 mg | ORAL_TABLET | Freq: Every day | ORAL | 1 refills | Status: DC

## 2022-12-31 MED ORDER — METOPROLOL SUCCINATE ER 25 MG PO TB24: 25.0000 mg | ORAL_TABLET | Freq: Every day | ORAL | 1 refills | Status: DC

## 2022-12-31 NOTE — Progress Notes (Signed)
Subjective:    Patient ID: Norma Barton, female    DOB: 02-21-1935, 88 y.o.   MRN: [id number]   Chief Complaint: medical management of chronic issues     HPI:  Norma Barton is a 88 y.o. who identifies as a female who was assigned female at birth.   Social history: Lives with: lives by herself- family checks o her daily Work history: retired   Water engineer in today for follow up of the following chronic medical issues:  1. Primary hypertension No c/o chest pain, sob or headache. Doe snot check blood pressure at home BP Readings from Last 3 Encounters:  11/19/22 133/78  11/04/22 124/76  10/14/22 106/72     2. Diabetes mellitus without complication Fasting blood sugars are running around 110-150. She is a newly dx diabetic and was started on metformin about 4 months ago. Lab Results  Component Value Date   HGBA1C 7.8 (H) 11/04/2022     3. Paroxysmal atrial fibrillation No palpitations or heart racing  4. Gastroesophageal reflux disease without esophagitis Is on omeprazole daily and is doing well.  5. Hypokalemia No muscle cramping Lab Results  Component Value Date   K 3.8 11/04/2022     6. Peripheral edema Has daily lower ext edema  7. Primary insomnia Sleeps about 7-8 hours a night  8. BMI 30.0-30.9,adult Weight is down 5 lbs Wt Readings from Last 3 Encounters:  12/31/22 152 lb (68.9 kg)  11/19/22 157 lb (71.2 kg)  11/06/22 161 lb (73 kg)   BMI Readings from Last 3 Encounters:  12/31/22 27.80 kg/m  11/19/22 28.72 kg/m  11/06/22 29.45 kg/m     New complaints: Constipation- only goes 2-3 x a week and has to strain  No Known Allergies Outpatient Encounter Medications as of 12/31/2022  Medication Sig   acetaminophen (TYLENOL) 500 MG tablet Take 1,000 mg by mouth every 6 (six) hours as needed for mild pain.   apixaban (ELIQUIS) 5 MG TABS tablet Take 1 tablet (5 mg total) by mouth 2 (two) times daily.   blood glucose meter kit and supplies  Dispense based on patient and insurance preference. Use up to four times daily as directed. (FOR ICD-10 E10.9, E11.9).   cholecalciferol (VITAMIN D) 25 MCG (1000 UNIT) tablet Take 2,000 Units by mouth in the morning.   dapagliflozin propanediol (FARXIGA) 10 MG TABS tablet Take 1 tablet (10 mg total) by mouth daily before breakfast.   dapagliflozin propanediol (FARXIGA) 5 MG TABS tablet Take 1 tablet (5 mg total) by mouth daily before breakfast.   docusate sodium (COLACE) 100 MG capsule Take 1 capsule (100 mg total) by mouth 2 (two) times daily.   furosemide (LASIX) 40 MG tablet Take 1 tablet (40 mg total) by mouth daily.   metFORMIN (GLUCOPHAGE) 500 MG tablet Take 1 tablet (500 mg total) by mouth 2 (two) times daily with a meal. (Patient not taking: Reported on 11/19/2022)   metoprolol succinate (TOPROL-XL) 25 MG 24 hr tablet Take 1 tablet (25 mg total) by mouth daily.   omeprazole (PRILOSEC) 40 MG capsule Take 1 capsule (40 mg total) by mouth daily.   polyethylene glycol (MIRALAX / GLYCOLAX) 17 g packet Take 17 g by mouth daily as needed for mild constipation.   potassium chloride SA (KLOR-CON M) 20 MEQ tablet Take 1 tablet (20 mEq total) by mouth daily.   triamcinolone ointment (KENALOG) 0.5 % Apply 1 Application topically 2 (two) times daily.   No facility-administered encounter medications on  file as of 12/31/2022.    Past Surgical History:  Procedure Laterality Date   ABDOMINAL HYSTERECTOMY     arthroscopy right knee     TOTAL KNEE ARTHROPLASTY Left 08/15/2016   Procedure: TOTAL KNEE ARTHROPLASTY;  Surgeon: Vickki Hearing, MD;  Location: [facility];  Service: Orthopedics;  Laterality: Left;   TOTAL KNEE ARTHROPLASTY Right 01/01/2022   Procedure: TOTAL KNEE ARTHROPLASTY;  Surgeon: Vickki Hearing, MD;  Location: [facility];  Service: Orthopedics;  Laterality: Right;    Family History  Problem Relation Age of Onset   Parkinson's disease Father    Breast cancer Sister       Controlled  substance contract: n/a     Review of Systems  Constitutional:  Negative for diaphoresis.  Eyes:  Negative for pain.  Respiratory:  Negative for shortness of breath.   Cardiovascular:  Negative for chest pain, palpitations and leg swelling.  Gastrointestinal:  Negative for abdominal pain.  Endocrine: Negative for polydipsia.  Skin:  Negative for rash.  Neurological:  Negative for dizziness, weakness and headaches.  Hematological:  Does not bruise/bleed easily.  All other systems reviewed and are negative.      Objective:   Physical Exam Vitals and nursing note reviewed.  Constitutional:      General: She is not in acute distress.    Appearance: Normal appearance. She is well-developed.  HENT:     Head: Normocephalic.     Right Ear: Tympanic membrane normal.     Left Ear: Tympanic membrane normal.     Nose: Nose normal.     Mouth/Throat:     Mouth: Mucous membranes are moist.  Eyes:     Pupils: Pupils are equal, round, and reactive to light.  Neck:     Vascular: No carotid bruit or JVD.  Cardiovascular:     Rate and Rhythm: Normal rate and regular rhythm.     Heart sounds: Normal heart sounds.  Pulmonary:     Effort: Pulmonary effort is normal. No respiratory distress.     Breath sounds: Normal breath sounds. No wheezing or rales.  Chest:     Chest wall: No tenderness.  Abdominal:     General: Bowel sounds are normal. There is no distension or abdominal bruit.     Palpations: Abdomen is soft. There is no hepatomegaly, splenomegaly, mass or pulsatile mass.     Tenderness: There is no abdominal tenderness.  Musculoskeletal:        General: Normal range of motion.     Cervical back: Normal range of motion and neck supple.  Lymphadenopathy:     Cervical: No cervical adenopathy.  Skin:    General: Skin is warm and dry.  Neurological:     Mental Status: She is alert and oriented to person, place, and time.     Deep Tendon Reflexes: Reflexes are normal and symmetric.   Psychiatric:        Behavior: Behavior normal.        Thought Content: Thought content normal.        Judgment: Judgment normal.     BP 121/74   Pulse 74   Temp (!) 97 F (36.1 C) (Oral)   Resp 20   Ht  (1.575 m)   Wt 152 lb (68.9 kg)   SpO2 97%   BMI 27.80 kg/m   HGBA1c 6.6%      Assessment & Plan:   TINIE MCGLOIN comes in today with chief complaint of Medical  Management of Chronic Issues   Diagnosis and orders addressed:  1. Primary hypertension Low sodium diet - CBC with Differential/Platelet - CMP14+EGFR - Lipid panel - Magnesium  2. Diabetes mellitus without complication Continue  to watch carbs in diet - Bayer DCA Hb A1c Waived  3. Paroxysmal atrial fibrillation Avoid caffeine. - metoprolol succinate (TOPROL-XL) 25 MG 24 hr tablet; Take 1 tablet (25 mg total) by mouth daily.  Dispense: 90 tablet; Refill: 1  4. Gastroesophageal reflux disease without esophagitis Avoid spicy foods Do not eat 2 hours prior to bedtime - omeprazole (PRILOSEC) 40 MG capsule; Take 1 capsule (40 mg total) by mouth daily.  Dispense: 90 capsule; Refill: 1  5. Hypokalemia Lab spending  6. Peripheral edema Elevate legs when sitting  7. Primary insomnia Bedtime routine  8. BMI 30.0-30.9,adult Discussed diet and exercise for person with BMI >25 Will recheck weight in 3-6 months  9. Slow transit constipation Miralx in apple juice daily   Labs pending Health Maintenance reviewed Diet and exercise encouraged  Follow up plan: 6 months   Mary-Margaret Daphine Deutscher, FNP

## 2023-01-01 LAB — CMP14+EGFR
ALT: 25 IU/L (ref 0–32)
AST: 47 IU/L — ABNORMAL HIGH (ref 0–40)
Albumin/Globulin Ratio: 1.5 (ref 1.2–2.2)
Albumin: 3.9 g/dL (ref 3.7–4.7)
Alkaline Phosphatase: 93 IU/L (ref 44–121)
BUN/Creatinine Ratio: 15 (ref 12–28)
BUN: 16 mg/dL (ref 8–27)
Bilirubin Total: 0.6 mg/dL (ref 0.0–1.2)
CO2: 27 mmol/L (ref 20–29)
Calcium: 9.9 mg/dL (ref 8.7–10.3)
Chloride: 98 mmol/L (ref 96–106)
Creatinine, Ser: 1.06 mg/dL — ABNORMAL HIGH (ref 0.57–1.00)
Globulin, Total: 2.6 g/dL (ref 1.5–4.5)
Glucose: 142 mg/dL — ABNORMAL HIGH (ref 70–99)
Potassium: 3.5 mmol/L (ref 3.5–5.2)
Sodium: 142 mmol/L (ref 134–144)
Total Protein: 6.5 g/dL (ref 6.0–8.5)
eGFR: 51 mL/min/{1.73_m2} — ABNORMAL LOW (ref >59)

## 2023-01-01 LAB — CBC WITH DIFFERENTIAL/PLATELET
Basophils Absolute: 0.1 10*3/uL (ref 0.0–0.2)
Basos: 1 %
EOS (ABSOLUTE): 0.2 10*3/uL (ref 0.0–0.4)
Eos: 2 %
Hematocrit: 47.1 % — ABNORMAL HIGH (ref 34.0–46.6)
Hemoglobin: 15.4 g/dL (ref 11.1–15.9)
Immature Grans (Abs): 0 10*3/uL (ref 0.0–0.1)
Immature Granulocytes: 0 %
Lymphocytes Absolute: 3.1 10*3/uL (ref 0.7–3.1)
Lymphs: 43 %
MCH: 31.6 pg (ref 26.6–33.0)
MCHC: 32.7 g/dL (ref 31.5–35.7)
MCV: 97 fL (ref 79–97)
Monocytes Absolute: 0.6 10*3/uL (ref 0.1–0.9)
Monocytes: 8 %
Neutrophils Absolute: 3.4 10*3/uL (ref 1.4–7.0)
Neutrophils: 46 %
Platelets: 161 10*3/uL (ref 150–450)
RBC: 4.87 x10E6/uL (ref 3.77–5.28)
RDW: 12.4 % (ref 11.7–15.4)
WBC: 7.3 10*3/uL (ref 3.4–10.8)

## 2023-01-01 LAB — LIPID PANEL
Chol/HDL Ratio: 2.4 ratio (ref 0.0–4.4)
Cholesterol, Total: 138 mg/dL (ref 100–199)
HDL: 58 mg/dL (ref >39)
LDL Chol Calc (NIH): 61 mg/dL (ref 0–99)
Triglycerides: 106 mg/dL (ref 0–149)
VLDL Cholesterol Cal: 19 mg/dL (ref 5–40)

## 2023-01-01 LAB — MAGNESIUM: Magnesium: 1.9 mg/dL (ref 1.6–2.3)

## 2023-01-02 ENCOUNTER — Ambulatory Visit: Payer: Medicare HMO | Admitting: Orthopedic Surgery

## 2023-01-07 DIAGNOSIS — D0462 Carcinoma in situ of skin of left upper limb, including shoulder: Secondary | ICD-10-CM | POA: Diagnosis not present

## 2023-01-07 DIAGNOSIS — D0461 Carcinoma in situ of skin of right upper limb, including shoulder: Secondary | ICD-10-CM | POA: Diagnosis not present

## 2023-01-07 DIAGNOSIS — D0439 Carcinoma in situ of skin of other parts of face: Secondary | ICD-10-CM | POA: Diagnosis not present

## 2023-01-14 ENCOUNTER — Other Ambulatory Visit: Payer: Self-pay | Admitting: Nurse Practitioner

## 2023-02-25 DIAGNOSIS — Z08 Encounter for follow-up examination after completed treatment for malignant neoplasm: Secondary | ICD-10-CM | POA: Diagnosis not present

## 2023-02-25 DIAGNOSIS — C44622 Squamous cell carcinoma of skin of right upper limb, including shoulder: Secondary | ICD-10-CM | POA: Diagnosis not present

## 2023-02-25 DIAGNOSIS — Z85828 Personal history of other malignant neoplasm of skin: Secondary | ICD-10-CM | POA: Diagnosis not present

## 2023-04-22 DIAGNOSIS — Z85828 Personal history of other malignant neoplasm of skin: Secondary | ICD-10-CM | POA: Diagnosis not present

## 2023-04-22 DIAGNOSIS — D0461 Carcinoma in situ of skin of right upper limb, including shoulder: Secondary | ICD-10-CM | POA: Diagnosis not present

## 2023-04-22 DIAGNOSIS — Z08 Encounter for follow-up examination after completed treatment for malignant neoplasm: Secondary | ICD-10-CM | POA: Diagnosis not present

## 2023-04-23 ENCOUNTER — Ambulatory Visit: Payer: Medicare HMO | Attending: Cardiology | Admitting: Cardiology

## 2023-04-23 ENCOUNTER — Encounter: Payer: Self-pay | Admitting: Cardiology

## 2023-04-23 VITALS — BP 106/70 | HR 65 | Ht 62.0 in | Wt 152.6 lb

## 2023-04-23 DIAGNOSIS — I4892 Unspecified atrial flutter: Secondary | ICD-10-CM | POA: Diagnosis not present

## 2023-04-23 DIAGNOSIS — R600 Localized edema: Secondary | ICD-10-CM

## 2023-04-23 MED ORDER — PRAVASTATIN SODIUM 20 MG PO TABS: 20.0000 mg | ORAL_TABLET | Freq: Every day | ORAL | 6 refills | Status: DC

## 2023-04-23 MED ORDER — FUROSEMIDE 40 MG PO TABS: 40.0000 mg | ORAL_TABLET | Freq: Every day | ORAL | 6 refills | Status: DC

## 2023-04-23 NOTE — Patient Instructions (Signed)
Medication Instructions:   Begin Pravastatin 20mg  daily Continue Lasix at 40mg  daily with the addition of 20mg  as needed for swelling  Metformin removed from list today  Continue all other medications.     Labwork:  none  Testing/Procedures:  none  Follow-Up:  6 months   Any Other Special Instructions Will Be Listed Below (If Applicable).   If you need a refill on your cardiac medications before your next appointment, please call your pharmacy.

## 2023-04-23 NOTE — Progress Notes (Signed)
Clinical Summary Ms. Standiford is a 88 y.o.female seen today for follow up of the following medical problems.      1.Aflutter,paroxysmal - new diagnosis, noted during 03/2021 preop evaluation for knee replacement - has been paroxysmal   -no recent palpitations - compliant with med. No bleeding on eliquis     2. LE edema - 04/2021 echo LVEF 60-65%, indet diastolic,  -chronic LE edema overall stable - reports compression stockings are uncomfortable.           Past Medical History:  Diagnosis Date   Arthritis    Diabetes mellitus without complication (HCC)    GERD (gastroesophageal reflux disease)    Hypertension      No Known Allergies   Current Outpatient Medications  Medication Sig Dispense Refill   acetaminophen (TYLENOL) 500 MG tablet Take 1,000 mg by mouth every 6 (six) hours as needed for mild pain.     apixaban (ELIQUIS) 5 MG TABS tablet Take 1 tablet (5 mg total) by mouth 2 (two) times daily. 60 tablet 6   blood glucose meter kit and supplies Dispense based on patient and insurance preference. Use up to four times daily as directed. (FOR ICD-10 E10.9, E11.9). 1 each 0   cholecalciferol (VITAMIN D) 25 MCG (1000 UNIT) tablet Take 2,000 Units by mouth in the morning.     dapagliflozin propanediol (FARXIGA) 10 MG TABS tablet Take 1 tablet (10 mg total) by mouth daily before breakfast. 30 tablet 5   dapagliflozin propanediol (FARXIGA) 5 MG TABS tablet Take 1 tablet (5 mg total) by mouth daily before breakfast. 90 tablet 1   docusate sodium (COLACE) 100 MG capsule Take 1 capsule (100 mg total) by mouth 2 (two) times daily. (Patient not taking: Reported on 12/31/2022) 10 capsule 0   furosemide (LASIX) 40 MG tablet Take 1 tablet (40 mg total) by mouth daily. 30 tablet 6   glucose blood (ONETOUCH VERIO) test strip USE UP TO FOUR TIMES DAILY AS DIRECTED E10.9, E11.9 400 strip 3   metFORMIN (GLUCOPHAGE) 500 MG tablet Take 1 tablet (500 mg total) by mouth 2 (two) times  daily with a meal. (Patient not taking: Reported on 11/19/2022) 180 tablet 3   metoprolol succinate (TOPROL-XL) 25 MG 24 hr tablet Take 1 tablet (25 mg total) by mouth daily. 90 tablet 1   omeprazole (PRILOSEC) 40 MG capsule Take 1 capsule (40 mg total) by mouth daily. 90 capsule 1   polyethylene glycol (MIRALAX / GLYCOLAX) 17 g packet Take 17 g by mouth daily as needed for mild constipation. (Patient not taking: Reported on 12/31/2022) 14 each 0   potassium chloride SA (KLOR-CON M) 20 MEQ tablet Take 1 tablet (20 mEq total) by mouth daily. 90 tablet 1   triamcinolone ointment (KENALOG) 0.5 % Apply 1 Application topically 2 (two) times daily. 30 g 0   No current facility-administered medications for this visit.     Past Surgical History:  Procedure Laterality Date   ABDOMINAL HYSTERECTOMY     arthroscopy right knee     TOTAL KNEE ARTHROPLASTY Left 08/15/2016   Procedure: TOTAL KNEE ARTHROPLASTY;  Surgeon: Vickki Hearing, MD;  Location: [facility];  Service: Orthopedics;  Laterality: Left;   TOTAL KNEE ARTHROPLASTY Right 01/01/2022   Procedure: TOTAL KNEE ARTHROPLASTY;  Surgeon: Vickki Hearing, MD;  Location: [facility];  Service: Orthopedics;  Laterality: Right;     No Known Allergies    Family History  Problem Relation Age of  Onset   Parkinson's disease Father    Breast cancer Sister      Social History Ms. Seib reports that she has never smoked. She has never used smokeless tobacco. Ms. Repa reports no history of alcohol use.   Review of Systems CONSTITUTIONAL: No weight loss, fever, chills, weakness or fatigue.  HEENT: Eyes: No visual loss, blurred vision, double vision or yellow sclerae.No hearing loss, sneezing, congestion, runny nose or sore throat.  SKIN: No rash or itching.  CARDIOVASCULAR: per hpi RESPIRATORY: No shortness of breath, cough or sputum.  GASTROINTESTINAL: No anorexia, nausea, vomiting or diarrhea. No abdominal pain or blood.  GENITOURINARY: No  burning on urination, no polyuria NEUROLOGICAL: No headache, dizziness, syncope, paralysis, ataxia, numbness or tingling in the extremities. No change in bowel or bladder control.  MUSCULOSKELETAL: No muscle, back pain, joint pain or stiffness.  LYMPHATICS: No enlarged nodes. No history of splenectomy.  PSYCHIATRIC: No history of depression or anxiety.  ENDOCRINOLOGIC: No reports of sweating, cold or heat intolerance. No polyuria or polydipsia.  Marland Kitchen   Physical Examination Today's Vitals   04/23/23 1417  BP: 106/70  Pulse: 65  SpO2: 97%  Weight: 152 lb 9.6 oz (69.2 kg)  Height: 5\' 2"  (1.575 m)   Body mass index is 27.91 kg/m.  Gen: resting comfortably, no acute distress HEENT: no scleral icterus, pupils equal round and reactive, no palptable cervical adenopathy,  CV: RRR, no m/rg, no jvd Resp: Clear to auscultation bilaterally GI: abdomen is soft, non-tender, non-distended, normal bowel sounds, no hepatosplenomegaly MSK: extremities are warm, no edema.  Skin: warm, no rash Neuro:  no focal deficits Psych: appropriate affect   Diagnostic Studies 04/2021 echo IMPRESSIONS     1. Left ventricular ejection fraction, by estimation, is 60 to 65%. The  left ventricle has normal function. The left ventricle has no regional  wall motion abnormalities. There is mild left ventricular hypertrophy of  the basal-septal segment. Left  ventricular diastolic parameters are indeterminate.   2. Right ventricular systolic function is normal. The right ventricular  size is normal. There is normal pulmonary artery systolic pressure.   3. Left atrial size was moderately dilated.   4. The mitral valve is normal in structure. No evidence of mitral valve  regurgitation. No evidence of mitral stenosis.   5. The aortic valve was not well visualized. Aortic valve regurgitation  is not visualized. No aortic stenosis is present.   6. The inferior vena cava is normal in size with greater than 50%   respiratory variability, suggesting right atrial pressure of 3 mmHg.     Assessment and Plan  Aflutter,paroxysmal/acquired thrombophilia - no symptoms, continue current meds including eliquis for stroke prevention .   2. LE edema - benign echo 2022 other than evidence of some diastolic dysfunction - continue lasix, can add additional 20mg  prn as needed for swelling     Antoine Poche, M.D.

## 2023-05-17 ENCOUNTER — Other Ambulatory Visit: Payer: Self-pay | Admitting: Nurse Practitioner

## 2023-06-04 IMAGING — DX DG KNEE 1-2V PORT*R*
2 series · 2 of 2 positions shown · non-contrast
Comparison: 11/29/2021

CLINICAL DATA: Status post right knee arthroplasty

EXAM:
PORTABLE RIGHT KNEE - 1-2 VIEW

[knee ap]
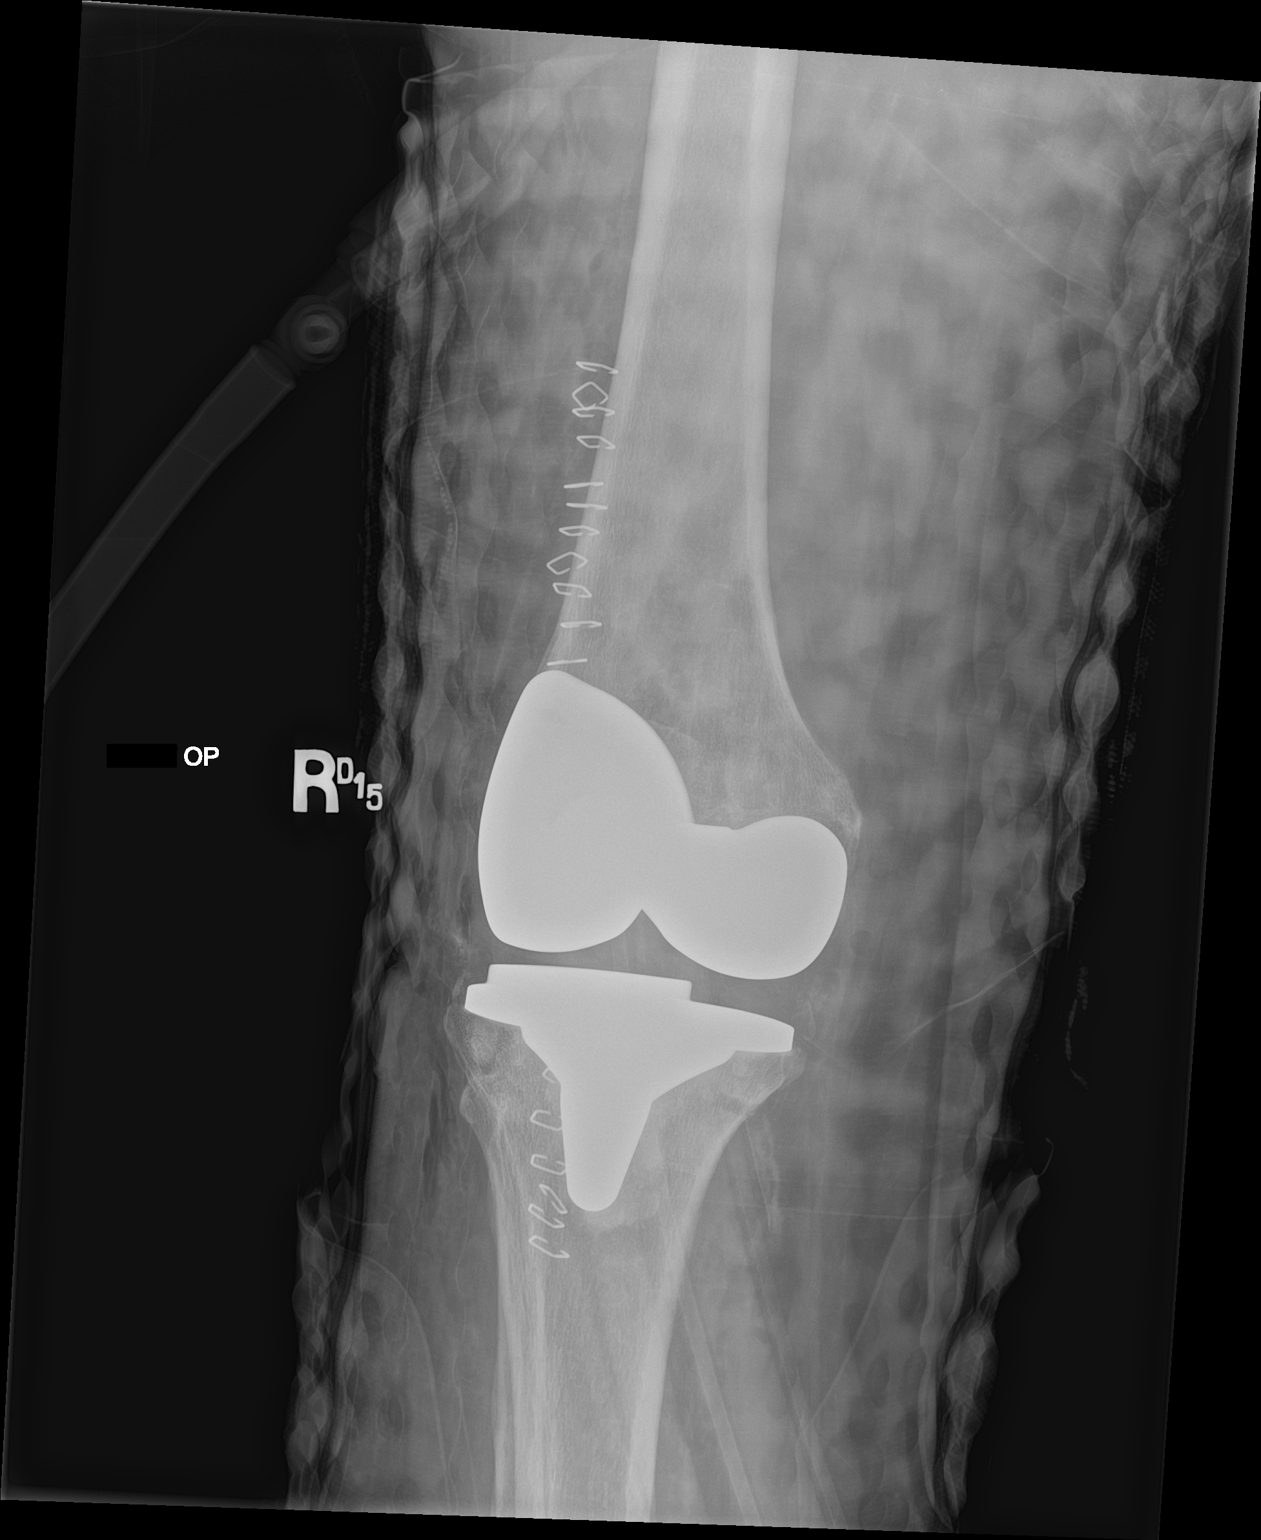

[knee lat]
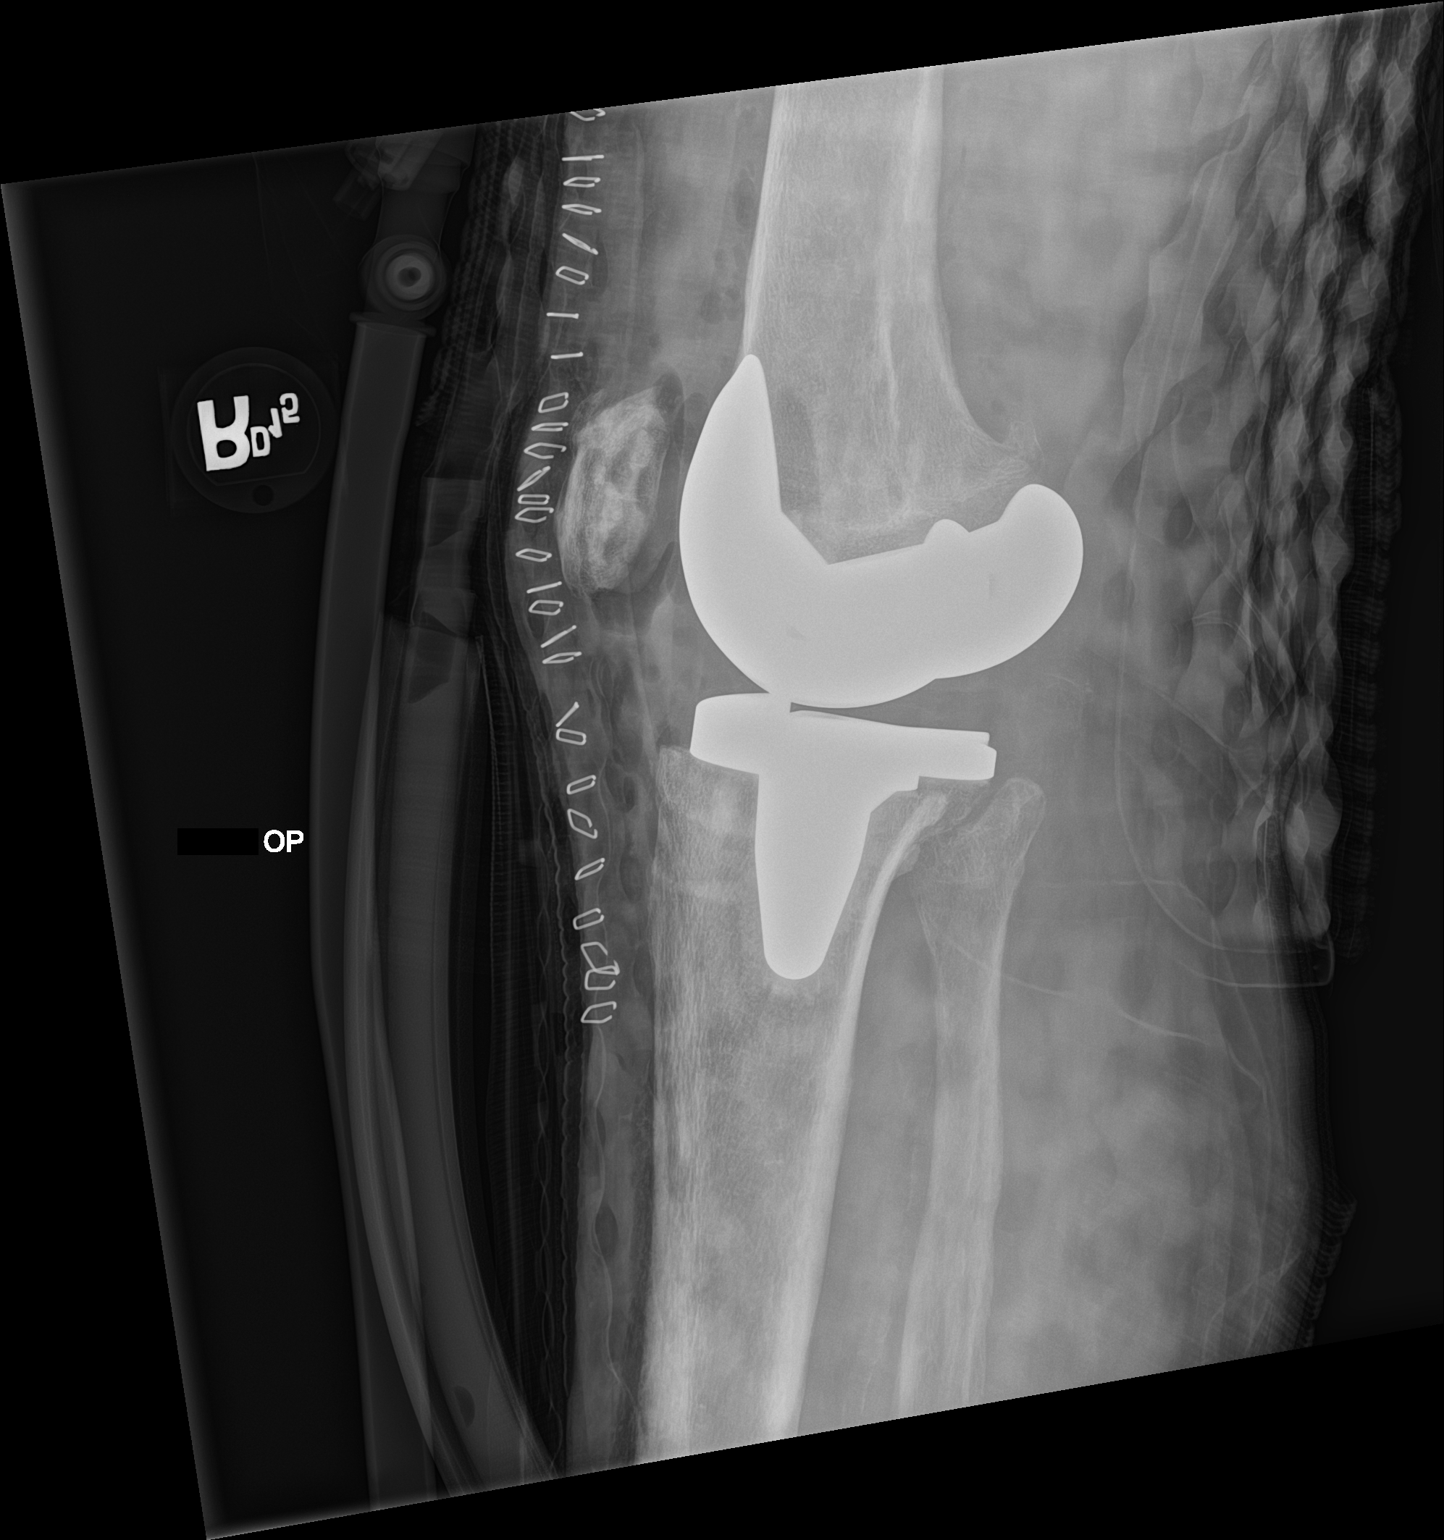

[2 of 2 positions shown; findings below may reference images not displayed]

FINDINGS: There is evidence of recent right knee arthroplasty. No fracture is
seen. In the previous study, severe degenerative changes were noted
in the right knee.
IMPRESSION: Interval right knee arthroplasty.

## 2023-07-01 DIAGNOSIS — Z85828 Personal history of other malignant neoplasm of skin: Secondary | ICD-10-CM | POA: Diagnosis not present

## 2023-07-01 DIAGNOSIS — Z08 Encounter for follow-up examination after completed treatment for malignant neoplasm: Secondary | ICD-10-CM | POA: Diagnosis not present

## 2023-07-10 ENCOUNTER — Ambulatory Visit (INDEPENDENT_AMBULATORY_CARE_PROVIDER_SITE_OTHER): Payer: Medicare HMO | Admitting: Nurse Practitioner

## 2023-07-10 ENCOUNTER — Encounter: Payer: Self-pay | Admitting: Nurse Practitioner

## 2023-07-10 VITALS — BP 114/78 | HR 76 | Temp 97.5°F | Resp 20 | Ht 62.0 in | Wt 147.5 lb

## 2023-07-10 DIAGNOSIS — E119 Type 2 diabetes mellitus without complications: Secondary | ICD-10-CM | POA: Diagnosis not present

## 2023-07-10 DIAGNOSIS — Z7984 Long term (current) use of oral hypoglycemic drugs: Secondary | ICD-10-CM | POA: Diagnosis not present

## 2023-07-10 DIAGNOSIS — E785 Hyperlipidemia, unspecified: Secondary | ICD-10-CM | POA: Diagnosis not present

## 2023-07-10 DIAGNOSIS — R600 Localized edema: Secondary | ICD-10-CM

## 2023-07-10 DIAGNOSIS — E876 Hypokalemia: Secondary | ICD-10-CM | POA: Diagnosis not present

## 2023-07-10 DIAGNOSIS — Z6830 Body mass index (BMI) 30.0-30.9, adult: Secondary | ICD-10-CM

## 2023-07-10 DIAGNOSIS — K219 Gastro-esophageal reflux disease without esophagitis: Secondary | ICD-10-CM | POA: Diagnosis not present

## 2023-07-10 DIAGNOSIS — E1169 Type 2 diabetes mellitus with other specified complication: Secondary | ICD-10-CM | POA: Diagnosis not present

## 2023-07-10 DIAGNOSIS — Z23 Encounter for immunization: Secondary | ICD-10-CM | POA: Diagnosis not present

## 2023-07-10 DIAGNOSIS — I10 Essential (primary) hypertension: Secondary | ICD-10-CM

## 2023-07-10 DIAGNOSIS — F5101 Primary insomnia: Secondary | ICD-10-CM | POA: Diagnosis not present

## 2023-07-10 DIAGNOSIS — I480 Paroxysmal atrial fibrillation: Secondary | ICD-10-CM | POA: Diagnosis not present

## 2023-07-10 LAB — BAYER DCA HB A1C WAIVED: HB A1C (BAYER DCA - WAIVED): 6.2 % — ABNORMAL HIGH (ref 4.8–5.6)

## 2023-07-10 MED ORDER — OMEPRAZOLE 40 MG PO CPDR: 40.0000 mg | DELAYED_RELEASE_CAPSULE | Freq: Every day | ORAL | 1 refills | Status: DC

## 2023-07-10 MED ORDER — POTASSIUM CHLORIDE CRYS ER 20 MEQ PO TBCR: 20.0000 meq | EXTENDED_RELEASE_TABLET | Freq: Every day | ORAL | 1 refills | Status: DC

## 2023-07-10 MED ORDER — FUROSEMIDE 40 MG PO TABS
40.0000 mg | ORAL_TABLET | Freq: Every day | ORAL | 1 refills | Status: DC
Start: 2023-07-10 — End: 2023-08-12

## 2023-07-10 MED ORDER — METOPROLOL SUCCINATE ER 25 MG PO TB24: 25.0000 mg | ORAL_TABLET | Freq: Every day | ORAL | 1 refills | Status: DC

## 2023-07-10 MED ORDER — DAPAGLIFLOZIN PROPANEDIOL 5 MG PO TABS
5.0000 mg | ORAL_TABLET | Freq: Every day | ORAL | 1 refills | Status: DC
Start: 2023-07-10 — End: 2023-12-04

## 2023-07-10 MED ORDER — APIXABAN 5 MG PO TABS
5.0000 mg | ORAL_TABLET | Freq: Two times a day (BID) | ORAL | 6 refills | Status: DC
Start: 2023-07-10 — End: 2024-01-08

## 2023-07-10 MED ORDER — PRAVASTATIN SODIUM 20 MG PO TABS: 20.0000 mg | ORAL_TABLET | Freq: Every day | ORAL | 1 refills | Status: DC

## 2023-07-10 NOTE — Progress Notes (Signed)
Subjective:    Patient ID: Norma Barton, female    DOB: 10-13-1934, 88 y.o.   MRN: [id number]   Chief Complaint: medical management of chronic issues     HPI:  Norma Barton is a 88 y.o. who identifies as a female who was assigned female at birth.   Social history: Lives with: lives by herself Work history: retired   Water engineer in today for follow up of the following chronic medical issues:  1. Primary hypertension No c/o chest pain, sob or headache. Does not check blood pressure at home BP Readings from Last 3 Encounters:  04/23/23 106/70  12/31/22 121/74  11/19/22 133/78     2. Diabetes mellitus without complication (HCC) Does not check blood sugars at home. We increased her to farxiga 10mg  in march but patient has remained on 5mg  daily. Lab Results  Component Value Date   HGBA1C 6.6 (H) 12/31/2022     3. Paroxysmal atrial fibrillation (HCC) Is on eliquis and is doing well. No bleeding issues  4. Gastroesophageal reflux disease without esophagitis Is on omperazole and is doing well  5. Hypokalemia No muscle cramps Lab Results  Component Value Date   K 3.5 12/31/2022     6. Peripheral edema Is on lasix and that helps she still has occasional edema if she is on her feet a lot.  7. Primary insomnia Sleeping well right now with no medication  8. BMI 30.0-30.9,adult Weight is down 5 lbs Wt Readings from Last 3 Encounters:  07/10/23 147 lb 8 oz (66.9 kg)  04/23/23 152 lb 9.6 oz (69.2 kg)  12/31/22 152 lb (68.9 kg)   BMI Readings from Last 3 Encounters:  07/10/23 26.98 kg/m  04/23/23 27.91 kg/m  12/31/22 27.80 kg/m      New complaints: None today  No Known Allergies Outpatient Encounter Medications as of 07/10/2023  Medication Sig   acetaminophen (TYLENOL) 500 MG tablet Take 1,000 mg by mouth every 6 (six) hours as needed for mild pain.   apixaban (ELIQUIS) 5 MG TABS tablet Take 1 tablet (5 mg total) by mouth 2 (two) times daily.   blood  glucose meter kit and supplies Dispense based on patient and insurance preference. Use up to four times daily as directed. (FOR ICD-10 E10.9, E11.9).   cholecalciferol (VITAMIN D) 25 MCG (1000 UNIT) tablet Take 2,000 Units by mouth in the morning.   dapagliflozin propanediol (FARXIGA) 10 MG TABS tablet Take 1 tablet (10 mg total) by mouth daily before breakfast.   dapagliflozin propanediol (FARXIGA) 5 MG TABS tablet Take 1 tablet (5 mg total) by mouth daily before breakfast.   docusate sodium (COLACE) 100 MG capsule Take 1 capsule (100 mg total) by mouth 2 (two) times daily.   furosemide (LASIX) 40 MG tablet Take 1 tablet (40 mg total) by mouth daily. (May take extra 1/2 tab (20mg ) as needed for swelling   glucose blood (ONETOUCH VERIO) test strip USE UP TO FOUR TIMES DAILY AS DIRECTED E10.9, E11.9   Lancets (ONETOUCH DELICA PLUS LANCET33G) MISC USE TO CHECK SUGAR UP TO 4 TIMES DAILY AS DIRECTED E10.9 E11.9   metoprolol succinate (TOPROL-XL) 25 MG 24 hr tablet Take 1 tablet (25 mg total) by mouth daily.   omeprazole (PRILOSEC) 40 MG capsule Take 1 capsule (40 mg total) by mouth daily.   potassium chloride SA (KLOR-CON M) 20 MEQ tablet Take 1 tablet (20 mEq total) by mouth daily.   pravastatin (PRAVACHOL) 20 MG tablet Take 1 tablet (  20 mg total) by mouth daily.   triamcinolone ointment (KENALOG) 0.5 % Apply 1 Application topically 2 (two) times daily.   No facility-administered encounter medications on file as of 07/10/2023.    Past Surgical History:  Procedure Laterality Date   ABDOMINAL HYSTERECTOMY     arthroscopy right knee     TOTAL KNEE ARTHROPLASTY Left 08/15/2016   Procedure: TOTAL KNEE ARTHROPLASTY;  Surgeon: Vickki Hearing, MD;  Location: [facility];  Service: Orthopedics;  Laterality: Left;   TOTAL KNEE ARTHROPLASTY Right 01/01/2022   Procedure: TOTAL KNEE ARTHROPLASTY;  Surgeon: Vickki Hearing, MD;  Location: [facility];  Service: Orthopedics;  Laterality: Right;    Family  History  Problem Relation Age of Onset   Parkinson's disease Father    Breast cancer Sister       Controlled substance contract: n/a     Review of Systems  Constitutional:  Negative for diaphoresis.  Eyes:  Negative for pain.  Respiratory:  Negative for shortness of breath.   Cardiovascular:  Negative for chest pain, palpitations and leg swelling.  Gastrointestinal:  Negative for abdominal pain.  Endocrine: Negative for polydipsia.  Skin:  Negative for rash.  Neurological:  Negative for dizziness, weakness and headaches.  Hematological:  Does not bruise/bleed easily.  All other systems reviewed and are negative.      Objective:   Physical Exam Vitals and nursing note reviewed.  Constitutional:      General: She is not in acute distress.    Appearance: Normal appearance. She is well-developed.  HENT:     Head: Normocephalic.     Right Ear: Tympanic membrane normal.     Left Ear: Tympanic membrane normal.     Nose: Nose normal.     Mouth/Throat:     Mouth: Mucous membranes are moist.  Eyes:     Pupils: Pupils are equal, round, and reactive to light.  Neck:     Vascular: No carotid bruit or JVD.  Cardiovascular:     Rate and Rhythm: Normal rate and regular rhythm.     Heart sounds: Normal heart sounds.  Pulmonary:     Effort: Pulmonary effort is normal. No respiratory distress.     Breath sounds: Normal breath sounds. No wheezing or rales.  Chest:     Chest wall: No tenderness.  Abdominal:     General: Bowel sounds are normal. There is no distension or abdominal bruit.     Palpations: Abdomen is soft. There is no hepatomegaly, splenomegaly, mass or pulsatile mass.     Tenderness: There is no abdominal tenderness.  Musculoskeletal:        General: Normal range of motion.     Cervical back: Normal range of motion and neck supple.     Right lower leg: Edema (1+) present.     Left lower leg: Edema (1+) present.  Lymphadenopathy:     Cervical: No cervical  adenopathy.  Skin:    General: Skin is warm and dry.  Neurological:     Mental Status: She is alert and oriented to person, place, and time.     Deep Tendon Reflexes: Reflexes are normal and symmetric.  Psychiatric:        Behavior: Behavior normal.        Thought Content: Thought content normal.        Judgment: Judgment normal.    BP 114/78   Pulse 76   Temp (!) 97.5 F (36.4 C) (Oral)   Resp 20  Ht 5\' 2"  (1.575 m)   Wt 147 lb 8 oz (66.9 kg)   SpO2 94%   BMI 26.98 kg/m   Hgba1c 6.2%      Assessment & Plan:   CRYTAL JASINSKI comes in today with chief complaint of Medical Management of Chronic Issues   Diagnosis and orders addressed:  1. Primary hypertension Low sodium diet - CBC with Differential/Platelet - CMP14+EGFR - Lipid panel  2. Diabetes mellitus without complication (HCC) Continue to watch carbs in diet - Bayer DCA Hb A1c Waived - dapagliflozin propanediol (FARXIGA) 5 MG TABS tablet; Take 1 tablet (5 mg total) by mouth daily before breakfast.  Dispense: 90 tablet; Refill: 1  3. Paroxysmal atrial fibrillation (HCC) Report any bleeding issues - apixaban (ELIQUIS) 5 MG TABS tablet; Take 1 tablet (5 mg total) by mouth 2 (two) times daily.  Dispense: 60 tablet; Refill: 6 - metoprolol succinate (TOPROL-XL) 25 MG 24 hr tablet; Take 1 tablet (25 mg total) by mouth daily.  Dispense: 90 tablet; Refill: 1  4. Gastroesophageal reflux disease without esophagitis Avoid spicy foods Do not eat 2 hours prior to bedtime  - omeprazole (PRILOSEC) 40 MG capsule; Take 1 capsule (40 mg total) by mouth daily.  Dispense: 90 capsule; Refill: 1  5. Hypokalemia Labs pending - potassium chloride SA (KLOR-CON M) 20 MEQ tablet; Take 1 tablet (20 mEq total) by mouth daily.  Dispense: 90 tablet; Refill: 1  6. Peripheral edema Elevate legs when sitting - furosemide (LASIX) 40 MG tablet; Take 1 tablet (40 mg total) by mouth daily. (May take extra 1/2 tab (20mg ) as needed for  swelling  Dispense: 90 tablet; Refill: 1  7. Primary insomnia Bedtime routine  8. BMI 30.0-30.9,adult Discussed diet and exercise for person with BMI >25 Will recheck weight in 3-6 months   9. Hyperlipidemia associated with type 2 diabetes mellitus (HCC) Low fat diet - pravastatin (PRAVACHOL) 20 MG tablet; Take 1 tablet (20 mg total) by mouth daily.  Dispense: 90 tablet; Refill: 1   Labs pending Health Maintenance reviewed Diet and exercise encouraged  Follow up plan: 6 months   Mary-Margaret Daphine Deutscher, FNP

## 2023-07-10 NOTE — Patient Instructions (Signed)

## 2023-07-11 LAB — CBC WITH DIFFERENTIAL/PLATELET
Basophils Absolute: 0.1 10*3/uL (ref 0.0–0.2)
Basos: 1 %
EOS (ABSOLUTE): 0.2 10*3/uL (ref 0.0–0.4)
Eos: 3 %
Hematocrit: 46.9 % — ABNORMAL HIGH (ref 34.0–46.6)
Hemoglobin: 15.7 g/dL (ref 11.1–15.9)
Immature Grans (Abs): 0 10*3/uL (ref 0.0–0.1)
Immature Granulocytes: 0 %
Lymphocytes Absolute: 2.9 10*3/uL (ref 0.7–3.1)
Lymphs: 48 %
MCH: 33 pg (ref 26.6–33.0)
MCHC: 33.5 g/dL (ref 31.5–35.7)
MCV: 99 fL — ABNORMAL HIGH (ref 79–97)
Monocytes Absolute: 0.5 10*3/uL (ref 0.1–0.9)
Monocytes: 8 %
Neutrophils Absolute: 2.4 10*3/uL (ref 1.4–7.0)
Neutrophils: 40 %
Platelets: 150 10*3/uL (ref 150–450)
RBC: 4.76 x10E6/uL (ref 3.77–5.28)
RDW: 12.4 % (ref 11.7–15.4)
WBC: 6.1 10*3/uL (ref 3.4–10.8)

## 2023-07-11 LAB — LIPID PANEL
Chol/HDL Ratio: 2.5 ratio (ref 0.0–4.4)
Cholesterol, Total: 137 mg/dL (ref 100–199)
HDL: 54 mg/dL (ref >39)
LDL Chol Calc (NIH): 63 mg/dL (ref 0–99)
Triglycerides: 114 mg/dL (ref 0–149)
VLDL Cholesterol Cal: 20 mg/dL (ref 5–40)

## 2023-07-11 LAB — CMP14+EGFR
ALT: 23 [IU]/L (ref 0–32)
AST: 45 [IU]/L — ABNORMAL HIGH (ref 0–40)
Albumin: 4 g/dL (ref 3.7–4.7)
Alkaline Phosphatase: 85 [IU]/L (ref 44–121)
BUN/Creatinine Ratio: 12 (ref 12–28)
BUN: 16 mg/dL (ref 8–27)
Bilirubin Total: 0.6 mg/dL (ref 0.0–1.2)
CO2: 27 mmol/L (ref 20–29)
Calcium: 9.8 mg/dL (ref 8.7–10.3)
Chloride: 101 mmol/L (ref 96–106)
Creatinine, Ser: 1.3 mg/dL — ABNORMAL HIGH (ref 0.57–1.00)
Globulin, Total: 2.5 g/dL (ref 1.5–4.5)
Glucose: 134 mg/dL — ABNORMAL HIGH (ref 70–99)
Potassium: 4.1 mmol/L (ref 3.5–5.2)
Sodium: 144 mmol/L (ref 134–144)
Total Protein: 6.5 g/dL (ref 6.0–8.5)
eGFR: 40 mL/min/{1.73_m2} — ABNORMAL LOW (ref >59)

## 2023-08-12 ENCOUNTER — Other Ambulatory Visit: Payer: Self-pay | Admitting: Nurse Practitioner

## 2023-08-12 DIAGNOSIS — R600 Localized edema: Secondary | ICD-10-CM

## 2023-10-17 ENCOUNTER — Other Ambulatory Visit: Payer: Self-pay | Admitting: Nurse Practitioner

## 2023-10-17 DIAGNOSIS — R600 Localized edema: Secondary | ICD-10-CM

## 2023-10-29 ENCOUNTER — Ambulatory Visit: Payer: Medicare HMO | Attending: Cardiology | Admitting: Cardiology

## 2023-10-29 ENCOUNTER — Encounter: Payer: Self-pay | Admitting: Cardiology

## 2023-10-29 VITALS — BP 114/60 | HR 72 | Ht 62.0 in | Wt 145.8 lb

## 2023-10-29 DIAGNOSIS — Z79899 Other long term (current) drug therapy: Secondary | ICD-10-CM | POA: Diagnosis not present

## 2023-10-29 DIAGNOSIS — R600 Localized edema: Secondary | ICD-10-CM | POA: Diagnosis not present

## 2023-10-29 DIAGNOSIS — I4892 Unspecified atrial flutter: Secondary | ICD-10-CM | POA: Diagnosis not present

## 2023-10-29 DIAGNOSIS — I10 Essential (primary) hypertension: Secondary | ICD-10-CM

## 2023-10-29 NOTE — Patient Instructions (Addendum)
Medication Instructions:   Continue all current medications.   Labwork:  BMET, Mg - orders given Office will contact with results via phone, letter or mychart.     Testing/Procedures:  none  Follow-Up:  6 months   Any Other Special Instructions Will Be Listed Below (If Applicable).   If you need a refill on your cardiac medications before your next appointment, please call your pharmacy.

## 2023-10-29 NOTE — Progress Notes (Signed)
Clinical Summary Norma Barton is a 89 y.o.female seen today for follow up of the following medical problems.        1.Aflutter,paroxysmal - new diagnosis, noted during 03/2021 preop evaluation for knee replacement - has been paroxysmal  - No palpitations - compliant with meds. No bleeding on eliquis  - EKG today shows NSR     2. LE edema - 04/2021 echo LVEF 60-65%, indet diastolic,  -chronic LE edema overall stable - reports compression stockings are uncomfortable.    - chronic LE edema unchanged   3. DM2 - followed by pcp - from cardiac standpoint she is on a statin   Past Medical History:  Diagnosis Date   Arthritis    Diabetes mellitus without complication (HCC)    GERD (gastroesophageal reflux disease)    Hypertension      No Known Allergies   Current Outpatient Medications  Medication Sig Dispense Refill   acetaminophen (TYLENOL) 500 MG tablet Take 1,000 mg by mouth every 6 (six) hours as needed for mild pain.     apixaban (ELIQUIS) 5 MG TABS tablet Take 1 tablet (5 mg total) by mouth 2 (two) times daily. 60 tablet 6   blood glucose meter kit and supplies Dispense based on patient and insurance preference. Use up to four times daily as directed. (FOR ICD-10 E10.9, E11.9). 1 each 0   cholecalciferol (VITAMIN D) 25 MCG (1000 UNIT) tablet Take 2,000 Units by mouth in the morning.     dapagliflozin propanediol (FARXIGA) 5 MG TABS tablet Take 1 tablet (5 mg total) by mouth daily before breakfast. 90 tablet 1   docusate sodium (COLACE) 100 MG capsule Take 1 capsule (100 mg total) by mouth 2 (two) times daily. (Patient not taking: Reported on 07/10/2023) 10 capsule 0   furosemide (LASIX) 40 MG tablet TAKE 1 TABLET (40 MG TOTAL) BY MOUTH DAILY. (MAY TAKE EXTRA 1/2 TAB (20MG ) AS NEEDED FOR SWELLING 135 tablet 0   glucose blood (ONETOUCH VERIO) test strip USE UP TO FOUR TIMES DAILY AS DIRECTED E10.9, E11.9 400 strip 3   Lancets (ONETOUCH DELICA PLUS LANCET33G) MISC  USE TO CHECK SUGAR UP TO 4 TIMES DAILY AS DIRECTED E10.9 E11.9 400 each 3   metoprolol succinate (TOPROL-XL) 25 MG 24 hr tablet Take 1 tablet (25 mg total) by mouth daily. 90 tablet 1   omeprazole (PRILOSEC) 40 MG capsule Take 1 capsule (40 mg total) by mouth daily. 90 capsule 1   potassium chloride SA (KLOR-CON M) 20 MEQ tablet Take 1 tablet (20 mEq total) by mouth daily. 90 tablet 1   pravastatin (PRAVACHOL) 20 MG tablet Take 1 tablet (20 mg total) by mouth daily. 90 tablet 1   triamcinolone ointment (KENALOG) 0.5 % Apply 1 Application topically 2 (two) times daily. (Patient not taking: Reported on 07/10/2023) 30 g 0   No current facility-administered medications for this visit.     Past Surgical History:  Procedure Laterality Date   ABDOMINAL HYSTERECTOMY     arthroscopy right knee     TOTAL KNEE ARTHROPLASTY Left 08/15/2016   Procedure: TOTAL KNEE ARTHROPLASTY;  Surgeon: Vickki Hearing, MD;  Location: [facility];  Service: Orthopedics;  Laterality: Left;   TOTAL KNEE ARTHROPLASTY Right 01/01/2022   Procedure: TOTAL KNEE ARTHROPLASTY;  Surgeon: Vickki Hearing, MD;  Location: [facility];  Service: Orthopedics;  Laterality: Right;     No Known Allergies    Family History  Problem Relation Age of Onset  Parkinson's disease Father    Breast cancer Sister      Social History Norma Barton reports that she has never smoked. She has never used smokeless tobacco. Norma Barton reports no history of alcohol use.     Physical Examination Today's Vitals   10/29/23 1347  BP: 114/60  Pulse: 72  Weight: 145 lb 12.8 oz (66.1 kg)  Height: 5\' 2"  (1.575 m)   Body mass index is 26.67 kg/m.  Gen: resting comfortably, no acute distress HEENT: no scleral icterus, pupils equal round and reactive, no palptable cervical adenopathy,  CV: RRR, no mrg, no jvd Resp: Clear to auscultation bilaterally GI: abdomen is soft, non-tender, non-distended, normal bowel sounds, no  hepatosplenomegaly MSK: extremities are warm, 1+ bilateral nonpitting edema.  Skin: warm, no rash Neuro:  no focal deficits Psych: appropriate affect   Diagnostic Studies  04/2021 echo IMPRESSIONS     1. Left ventricular ejection fraction, by estimation, is 60 to 65%. The  left ventricle has normal function. The left ventricle has no regional  wall motion abnormalities. There is mild left ventricular hypertrophy of  the basal-septal segment. Left  ventricular diastolic parameters are indeterminate.   2. Right ventricular systolic function is normal. The right ventricular  size is normal. There is normal pulmonary artery systolic pressure.   3. Left atrial size was moderately dilated.   4. The mitral valve is normal in structure. No evidence of mitral valve  regurgitation. No evidence of mitral stenosis.   5. The aortic valve was not well visualized. Aortic valve regurgitation  is not visualized. No aortic stenosis is present.   6. The inferior vena cava is normal in size with greater than 50%  respiratory variability, suggesting right atrial pressure of 3 mmHg.        Assessment and Plan   Aflutter,paroxysmal/acquired thrombophilia - denies any symptoms, continue current meds including eliquis for stroke prevention - EKG today shows NSR   2. LE edema - benign echo 2022 other than evidence of some diastolic dysfunction - she will continue lasix - uptrend in Cr on last labs, repeat bmet/mg since on diuretic.     Antoine Poche, M.D.

## 2023-11-03 ENCOUNTER — Other Ambulatory Visit: Payer: Medicare HMO

## 2023-11-03 DIAGNOSIS — I10 Essential (primary) hypertension: Secondary | ICD-10-CM | POA: Diagnosis not present

## 2023-11-03 DIAGNOSIS — Z79899 Other long term (current) drug therapy: Secondary | ICD-10-CM | POA: Diagnosis not present

## 2023-11-03 DIAGNOSIS — R600 Localized edema: Secondary | ICD-10-CM | POA: Diagnosis not present

## 2023-11-04 LAB — BASIC METABOLIC PANEL
BUN/Creatinine Ratio: 17 (ref 12–28)
BUN: 19 mg/dL (ref 8–27)
CO2: 29 mmol/L (ref 20–29)
Calcium: 9.7 mg/dL (ref 8.7–10.3)
Chloride: 99 mmol/L (ref 96–106)
Creatinine, Ser: 1.14 mg/dL — ABNORMAL HIGH (ref 0.57–1.00)
Glucose: 168 mg/dL — ABNORMAL HIGH (ref 70–99)
Potassium: 4.1 mmol/L (ref 3.5–5.2)
Sodium: 143 mmol/L (ref 134–144)
eGFR: 47 mL/min/{1.73_m2} — ABNORMAL LOW (ref >59)

## 2023-11-04 LAB — MAGNESIUM: Magnesium: 2.1 mg/dL (ref 1.6–2.3)

## 2023-11-12 ENCOUNTER — Ambulatory Visit: Payer: Medicare HMO

## 2023-11-12 ENCOUNTER — Encounter: Payer: Self-pay | Admitting: *Deleted

## 2023-11-12 VITALS — Ht 62.0 in | Wt 145.0 lb

## 2023-11-12 DIAGNOSIS — Z0000 Encounter for general adult medical examination without abnormal findings: Secondary | ICD-10-CM

## 2023-11-12 NOTE — Patient Instructions (Signed)
 Norma Barton , Thank you for taking time to come for your Medicare Wellness Visit. I appreciate your ongoing commitment to your health goals. Please review the following plan we discussed and let me know if I can assist you in the future.   Referrals/Orders/Follow-Ups/Clinician Recommendations: Aim for 30 minutes of exercise or brisk walking, 6-8 glasses of water, and 5 servings of fruits and vegetables each day.  This is a list of the screening recommended for you and due dates:  Health Maintenance  Topic Date Due   Pneumonia Vaccine (1 of 2 - PCV) Never done   Eye exam for diabetics  Never done   Zoster (Shingles) Vaccine (1 of 2) Never done   COVID-19 Vaccine (3 - Moderna risk series) 06/20/2020   DEXA scan (bone density measurement)  03/10/2022   DTaP/Tdap/Td vaccine (1 - Tdap) 07/09/2024*   Hemoglobin A1C  01/08/2024   Complete foot exam   07/09/2024   Medicare Annual Wellness Visit  11/11/2024   Flu Shot  Completed   HPV Vaccine  Aged Out  *Topic was postponed. The date shown is not the original due date.    Advanced directives: (ACP Link)Information on Advanced Care Planning can be found at [facility] Advance Health Care Directives Advance Health Care Directives ([email or web address]   Next Medicare Annual Wellness Visit scheduled for next year: Yes

## 2023-11-12 NOTE — Progress Notes (Signed)
 Subjective:   Norma Barton is a 89 y.o. who presents for a Medicare Wellness preventive visit.  Visit Complete: Virtual I connected with  Norma Barton on 11/12/23 by a audio enabled telemedicine application and verified that I am speaking with the correct person using two identifiers.  Patient Location: Home  Provider Location: Home Office  I discussed the limitations of evaluation and management by telemedicine. The patient expressed understanding and agreed to proceed.  Vital Signs: Because this visit was a virtual/telehealth visit, some criteria may be missing or patient reported. Any vitals not documented were not able to be obtained and vitals that have been documented are patient reported.  VideoDeclined- This patient declined Librarian, academic. Therefore the visit was completed with audio only.  AWV Questionnaire: No: Patient Medicare AWV questionnaire was not completed prior to this visit.  Cardiac Risk Factors include: advanced age (>65men, >28 women);hypertension;dyslipidemia;diabetes mellitus     Objective:    Today's Vitals   11/12/23 1650  Weight: 145 lb (65.8 kg)  Height: 5\' 2"  (1.575 m)   Body mass index is 26.52 kg/m.     11/12/2023    4:57 PM 11/06/2022    2:55 PM 01/01/2022    4:00 PM 01/01/2022    8:08 AM 12/28/2021    1:33 PM 11/05/2021    2:55 PM 04/13/2021   11:52 AM  Advanced Directives  Does Patient Have a Medical Advance Directive? No No  No No No No  Would patient like information on creating a medical advance directive? Yes (MAU/Ambulatory/Procedural Areas - Information given) Yes (MAU/Ambulatory/Procedural Areas - Information given) No - Patient declined  No - Patient declined No - Patient declined No - Patient declined    Current Medications (verified) Outpatient Encounter Medications as of 11/12/2023  Medication Sig   acetaminophen (TYLENOL) 500 MG tablet Take 1,000 mg by mouth every 6 (six) hours as needed  for mild pain.   apixaban (ELIQUIS) 5 MG TABS tablet Take 1 tablet (5 mg total) by mouth 2 (two) times daily.   blood glucose meter kit and supplies Dispense based on patient and insurance preference. Use up to four times daily as directed. (FOR ICD-10 E10.9, E11.9).   cholecalciferol (VITAMIN D) 25 MCG (1000 UNIT) tablet Take 2,000 Units by mouth in the morning.   dapagliflozin propanediol (FARXIGA) 5 MG TABS tablet Take 1 tablet (5 mg total) by mouth daily before breakfast.   docusate sodium (COLACE) 100 MG capsule Take 1 capsule (100 mg total) by mouth 2 (two) times daily.   furosemide (LASIX) 40 MG tablet TAKE 1 TABLET (40 MG TOTAL) BY MOUTH DAILY. (MAY TAKE EXTRA 1/2 TAB (20MG ) AS NEEDED FOR SWELLING   glucose blood (ONETOUCH VERIO) test strip USE UP TO FOUR TIMES DAILY AS DIRECTED E10.9, E11.9   Lancets (ONETOUCH DELICA PLUS LANCET33G) MISC USE TO CHECK SUGAR UP TO 4 TIMES DAILY AS DIRECTED E10.9 E11.9   metoprolol succinate (TOPROL-XL) 25 MG 24 hr tablet Take 1 tablet (25 mg total) by mouth daily.   omeprazole (PRILOSEC) 40 MG capsule Take 1 capsule (40 mg total) by mouth daily.   potassium chloride SA (KLOR-CON M) 20 MEQ tablet Take 1 tablet (20 mEq total) by mouth daily.   pravastatin (PRAVACHOL) 20 MG tablet Take 1 tablet (20 mg total) by mouth daily.   No facility-administered encounter medications on file as of 11/12/2023.    Allergies (verified) Metformin and related   History: Past Medical History:  Diagnosis Date   Arthritis    Diabetes mellitus without complication (HCC)    GERD (gastroesophageal reflux disease)    Hypertension    Past Surgical History:  Procedure Laterality Date   ABDOMINAL HYSTERECTOMY     arthroscopy right knee     TOTAL KNEE ARTHROPLASTY Left 08/15/2016   Procedure: TOTAL KNEE ARTHROPLASTY;  Surgeon: Vickki Hearing, MD;  Location: [facility];  Service: Orthopedics;  Laterality: Left;   TOTAL KNEE ARTHROPLASTY Right 01/01/2022   Procedure: TOTAL  KNEE ARTHROPLASTY;  Surgeon: Vickki Hearing, MD;  Location: [facility];  Service: Orthopedics;  Laterality: Right;   Family History  Problem Relation Age of Onset   Parkinson's disease Father    Breast cancer Sister    Social History   Socioeconomic History   Marital status: Widowed    Spouse name: Not on file   Number of children: 5   Years of education: 72   Highest education level: 12th grade  Occupational History   Occupation: retired  Tobacco Use   Smoking status: Never   Smokeless tobacco: Never  Substance and Sexual Activity   Alcohol use: No   Drug use: No   Sexual activity: Not Currently  Other Topics Concern   Not on file  Social History Narrative   Lives alone in apartment   Has 5 sons - they all live within 30 miles or so   Had one daughter who died right after birth (cord wrapped around neck at birth)   Social Drivers of Health   Financial Resource Strain: Low Risk  (11/12/2023)   Overall Financial Resource Strain (CARDIA)    Difficulty of Paying Living Expenses: Not hard at all  Food Insecurity: No Food Insecurity (11/12/2023)   Hunger Vital Sign    Worried About Running Out of Food in the Last Year: Never true    Ran Out of Food in the Last Year: Never true  Transportation Needs: No Transportation Needs (11/12/2023)   PRAPARE - Administrator, Civil Service (Medical): No    Lack of Transportation (Non-Medical): No  Physical Activity: Inactive (11/12/2023)   Exercise Vital Sign    Days of Exercise per Week: 0 days    Minutes of Exercise per Session: 0 min  Stress: No Stress Concern Present (11/12/2023)   [employer] of Occupational Health - Occupational Stress Questionnaire    Feeling of Stress : Not at all  Social Connections: Moderately Isolated (11/12/2023)   Social Connection and Isolation Panel [NHANES]    Frequency of Communication with Friends and Family: More than three times a week    Frequency of Social Gatherings with Friends  and Family: Three times a week    Attends Religious Services: Never    Active Member of Clubs or Organizations: Yes    Attends Banker Meetings: More than 4 times per year    Marital Status: Widowed    Tobacco Counseling Counseling given: Not Answered    Clinical Intake:  Pre-visit preparation completed: Yes  Pain : No/denies pain     Diabetes: No  How often do you need to have someone help you when you read instructions, pamphlets, or other written materials from your doctor or pharmacy?: 1 - Never  Interpreter Needed?: No  Information entered by :: Kandis Fantasia LPN   Activities of Daily Living     11/12/2023    4:56 PM  In your present state of health, do you have any difficulty performing  the following activities:  Hearing? 0  Vision? 0  Difficulty concentrating or making decisions? 0  Walking or climbing stairs? 0  Dressing or bathing? 0  Doing errands, shopping? 0  Preparing Food and eating ? N  Using the Toilet? N  In the past six months, have you accidently leaked urine? N  Do you have problems with loss of bowel control? N  Managing your Medications? N  Managing your Finances? N  Housekeeping or managing your Housekeeping? N    Patient Care Team: [name], FNP as PCP - General (Nurse Practitioner) Antoine Poche, MD as PCP - Cardiology (Cardiology) Randa Spike Kelton Pillar, LCSW as Social Worker (Licensed Clinical Social Worker) Conley Rolls, Westley Chandler, MD as Referring Physician (Optometry) Vickki Hearing, MD as Consulting Physician (Orthopedic Surgery)  Indicate any recent Medical Services you may have received from other than [facility] providers in the past year (date may be approximate).     Assessment:   This is a routine wellness examination for Norma Barton.  Hearing/Vision screen Hearing Screening - Comments:: Denies hearing difficulties   Vision Screening - Comments::  up to date with routine eye exams with Dr. Conley Rolls      Goals Addressed   None    Depression Screen     11/12/2023    4:55 PM 07/10/2023    2:18 PM 12/31/2022    2:09 PM 11/19/2022   12:21 PM 11/06/2022    3:01 PM 11/04/2022   11:45 AM 05/03/2022   11:58 AM  PHQ 2/9 Scores  PHQ - 2 Score 0 0 0 0 0 0 0  PHQ- 9 Score  0 0 0  0 0    Fall Risk     11/12/2023    4:56 PM 07/10/2023    2:18 PM 12/31/2022    2:09 PM 11/19/2022   12:21 PM 11/06/2022    2:49 PM  Fall Risk   Falls in the past year? 0 0 0 0 0  Number falls in past yr: 0    0  Injury with Fall? 0    0  Risk for fall due to : No Fall Risks      Follow up Falls prevention discussed;Education provided;Falls evaluation completed Falls evaluation completed Falls evaluation completed  Falls prevention discussed;Education provided;Falls evaluation completed    MEDICARE RISK AT HOME:  Medicare Risk at Home Any stairs in or around the home?: No If so, are there any without handrails?: No Home free of loose throw rugs in walkways, pet beds, electrical cords, etc?: Yes Adequate lighting in your home to reduce risk of falls?: Yes Life alert?: No Use of a cane, walker or w/c?: No Grab bars in the bathroom?: Yes Shower chair or bench in shower?: No Elevated toilet seat or a handicapped toilet?: Yes  TIMED UP AND GO:  Was the test performed?  No  Cognitive Function: 6CIT completed        11/12/2023    4:57 PM 11/06/2022    3:01 PM 01/31/2020    1:38 PM  6CIT Screen  What Year? 0 points 0 points 0 points  What month? 0 points 0 points 0 points  What time? 0 points 0 points 0 points  Count back from 20 0 points 0 points 0 points  Months in reverse 0 points 0 points 0 points  Repeat phrase 0 points 0 points 0 points  Total Score 0 points 0 points 0 points    Immunizations Immunization History  Administered  Date(s) Administered   Fluad Quad(high Dose 65+) 07/23/2019, 10/23/2020, 05/31/2021, 09/18/2022   Fluad Trivalent(High Dose 65+) 07/10/2023   Influenza, High Dose Seasonal PF  08/21/2017, 07/14/2018   Influenza,inj,Quad PF,6+ Mos 06/25/2016   Moderna Sars-Covid-2 Vaccination 04/25/2020, 05/23/2020    Screening Tests Health Maintenance  Topic Date Due   Pneumonia Vaccine 46+ Years old (1 of 2 - PCV) Never done   OPHTHALMOLOGY EXAM  Never done   Zoster Vaccines- Shingrix (1 of 2) Never done   COVID-19 Vaccine (3 - Moderna risk series) 06/20/2020   DEXA SCAN  03/10/2022   DTaP/Tdap/Td (1 - Tdap) 07/09/2024 (Originally 02/23/1954)   HEMOGLOBIN A1C  01/08/2024   FOOT EXAM  07/09/2024   Medicare Annual Wellness (AWV)  11/11/2024   INFLUENZA VACCINE  Completed   HPV VACCINES  Aged Out    Health Maintenance  Health Maintenance Due  Topic Date Due   Pneumonia Vaccine 81+ Years old (1 of 2 - PCV) Never done   OPHTHALMOLOGY EXAM  Never done   Zoster Vaccines- Shingrix (1 of 2) Never done   COVID-19 Vaccine (3 - Moderna risk series) 06/20/2020   DEXA SCAN  03/10/2022   Health Maintenance Items Addressed: Declines additional vaccines at this time   Additional Screening:  Vision Screening: Recommended annual ophthalmology exams for early detection of glaucoma and other disorders of the eye.  Dental Screening: Recommended annual dental exams for proper oral hygiene  Community Resource Referral / Chronic Care Management: CRR required this visit?  No   CCM required this visit?  No     Plan:     I have personally reviewed and noted the following in the patient's chart:   Medical and social history Use of alcohol, tobacco or illicit drugs  Current medications and supplements including opioid prescriptions. Patient is not currently taking opioid prescriptions. Functional ability and status Nutritional status Physical activity Advanced directives List of other physicians Hospitalizations, surgeries, and ER visits in previous 12 months Vitals Screenings to include cognitive, depression, and falls Referrals and appointments  In addition, I have  reviewed and discussed with patient certain preventive protocols, quality metrics, and best practice recommendations. A written personalized care plan for preventive services as well as general preventive health recommendations were provided to patient.     Kandis Fantasia [place], [place]   12/23/8117   After Visit Summary: (Mail) Due to this being a telephonic visit, the after visit summary with patients personalized plan was offered to patient via mail   Notes: Nothing significant to report at this time.

## 2023-11-20 ENCOUNTER — Ambulatory Visit: Payer: Self-pay | Admitting: Nurse Practitioner

## 2023-11-20 NOTE — Telephone Encounter (Signed)
  Chief Complaint: cough Symptoms: cough, runny nose, achy Frequency: 4 days Pertinent Negatives: Patient denies SOB, difficulty breathing, N/V, CP, palpitations Disposition: [] ED /[] Urgent Care (no appt availability in office) / [] Appointment(In office/virtual)/ []  [facility]/ [x] Home Care/ [] Refused Recommended Disposition /[] [facility]/ []  Follow-up with PCP Additional Notes: Pt c/o cough and runny nose. Pt states that she takes eliquis and is not sure what OTC meds she can take with eliquis. Pt requesting call back with OTC medication options. Pt denies N/V, CP, SOB. Pt offered appt, declined.   Reason for Disposition  Cough  Answer Assessment - Initial Assessment Questions 1. ONSET: "When did the cough begin?"      4 days ago 2. SEVERITY: "How bad is the cough today?"      Not to bad 3. SPUTUM: "Describe the color of your sputum" (none, dry cough; clear, white, yellow, green)     white 4. HEMOPTYSIS: "Are you coughing up any blood?" If so ask: "How much?" (flecks, streaks, tablespoons, etc.)     denies 5. DIFFICULTY BREATHING: "Are you having difficulty breathing?" If Yes, ask: "How bad is it?" (e.g., mild, moderate, severe)    - MILD: No SOB at rest, mild SOB with walking, speaks normally in sentences, can lie down, no retractions, pulse < 100.    - MODERATE: SOB at rest, SOB with minimal exertion and prefers to sit, cannot lie down flat, speaks in phrases, mild retractions, audible wheezing, pulse 100-120.    - SEVERE: Very SOB at rest, speaks in single words, struggling to breathe, sitting hunched forward, retractions, pulse > 120      denies 6. FEVER: "Do you have a fever?" If Yes, ask: "What is your temperature, how was it measured, and when did it start?"     unsure 7. CARDIAC HISTORY: "Do you have any history of heart disease?" (e.g., heart attack, congestive heart failure)     Heart murmur 8. LUNG HISTORY: "Do you have any history of lung disease?"   (e.g., pulmonary embolus, asthma, emphysema)     denies 9. PE RISK FACTORS: "Do you have a history of blood clots?" (or: recent major surgery, recent prolonged travel, bedridden)     denies 10. OTHER SYMPTOMS: "Do you have any other symptoms?" (e.g., runny nose, wheezing, chest pain)       Runny nose,  12. TRAVEL: "Have you traveled out of the country in the last month?" (e.g., travel history, exposures)       denies  Protocols used: Cough - Acute Productive-A-AH

## 2023-11-20 NOTE — Telephone Encounter (Signed)
 You can take any cough  meds with eliquis

## 2023-11-25 ENCOUNTER — Other Ambulatory Visit: Payer: Self-pay | Admitting: Nurse Practitioner

## 2023-11-25 DIAGNOSIS — K219 Gastro-esophageal reflux disease without esophagitis: Secondary | ICD-10-CM

## 2023-11-28 ENCOUNTER — Encounter: Payer: Self-pay | Admitting: *Deleted

## 2023-12-04 ENCOUNTER — Other Ambulatory Visit: Payer: Self-pay | Admitting: Nurse Practitioner

## 2023-12-04 DIAGNOSIS — E119 Type 2 diabetes mellitus without complications: Secondary | ICD-10-CM

## 2024-01-01 ENCOUNTER — Ambulatory Visit: Payer: Medicare HMO | Admitting: Nurse Practitioner

## 2024-01-08 ENCOUNTER — Encounter: Payer: Self-pay | Admitting: Nurse Practitioner

## 2024-01-08 ENCOUNTER — Ambulatory Visit: Payer: Medicare HMO | Admitting: Nurse Practitioner

## 2024-01-08 VITALS — BP 105/72 | HR 78 | Temp 97.7°F | Ht 62.0 in | Wt 143.0 lb

## 2024-01-08 DIAGNOSIS — R600 Localized edema: Secondary | ICD-10-CM | POA: Diagnosis not present

## 2024-01-08 DIAGNOSIS — E119 Type 2 diabetes mellitus without complications: Secondary | ICD-10-CM | POA: Diagnosis not present

## 2024-01-08 DIAGNOSIS — E785 Hyperlipidemia, unspecified: Secondary | ICD-10-CM | POA: Diagnosis not present

## 2024-01-08 DIAGNOSIS — Z7984 Long term (current) use of oral hypoglycemic drugs: Secondary | ICD-10-CM

## 2024-01-08 DIAGNOSIS — E1169 Type 2 diabetes mellitus with other specified complication: Secondary | ICD-10-CM

## 2024-01-08 DIAGNOSIS — Z6830 Body mass index (BMI) 30.0-30.9, adult: Secondary | ICD-10-CM | POA: Diagnosis not present

## 2024-01-08 DIAGNOSIS — Z0000 Encounter for general adult medical examination without abnormal findings: Secondary | ICD-10-CM

## 2024-01-08 DIAGNOSIS — E876 Hypokalemia: Secondary | ICD-10-CM

## 2024-01-08 DIAGNOSIS — F5101 Primary insomnia: Secondary | ICD-10-CM | POA: Diagnosis not present

## 2024-01-08 DIAGNOSIS — Z0001 Encounter for general adult medical examination with abnormal findings: Secondary | ICD-10-CM

## 2024-01-08 DIAGNOSIS — K219 Gastro-esophageal reflux disease without esophagitis: Secondary | ICD-10-CM | POA: Diagnosis not present

## 2024-01-08 DIAGNOSIS — I10 Essential (primary) hypertension: Secondary | ICD-10-CM | POA: Diagnosis not present

## 2024-01-08 DIAGNOSIS — I480 Paroxysmal atrial fibrillation: Secondary | ICD-10-CM

## 2024-01-08 LAB — LIPID PANEL

## 2024-01-08 LAB — BAYER DCA HB A1C WAIVED: HB A1C (BAYER DCA - WAIVED): 6.2 % — ABNORMAL HIGH (ref 4.8–5.6)

## 2024-01-08 LAB — CBC WITH DIFFERENTIAL/PLATELET

## 2024-01-08 MED ORDER — OMEPRAZOLE 40 MG PO CPDR: 40.0000 mg | DELAYED_RELEASE_CAPSULE | Freq: Every day | ORAL | 1 refills | Status: DC

## 2024-01-08 MED ORDER — POTASSIUM CHLORIDE CRYS ER 20 MEQ PO TBCR
20.0000 meq | EXTENDED_RELEASE_TABLET | Freq: Every day | ORAL | 1 refills | Status: AC
Start: 2024-01-08 — End: ?

## 2024-01-08 MED ORDER — PRAVASTATIN SODIUM 20 MG PO TABS: 20.0000 mg | ORAL_TABLET | Freq: Every day | ORAL | 1 refills | Status: DC

## 2024-01-08 MED ORDER — FUROSEMIDE 40 MG PO TABS: 40.0000 mg | ORAL_TABLET | Freq: Two times a day (BID) | ORAL | 1 refills | Status: DC | PRN

## 2024-01-08 MED ORDER — DAPAGLIFLOZIN PROPANEDIOL 5 MG PO TABS: 5.0000 mg | ORAL_TABLET | Freq: Every day | ORAL | 1 refills | Status: DC

## 2024-01-08 MED ORDER — APIXABAN 5 MG PO TABS: 5.0000 mg | ORAL_TABLET | Freq: Two times a day (BID) | ORAL | 6 refills | Status: DC

## 2024-01-08 MED ORDER — METOPROLOL SUCCINATE ER 25 MG PO TB24: 25.0000 mg | ORAL_TABLET | Freq: Every day | ORAL | 1 refills | Status: DC

## 2024-01-08 NOTE — Patient Instructions (Signed)
 Peripheral Edema  Peripheral edema is swelling that is caused by a buildup of fluid. Peripheral edema most often affects the lower legs, ankles, and feet. It can also develop in the arms, hands, and face. The area of the body that has peripheral edema will look swollen. It may also feel heavy or warm. Your clothes may start to feel tight. Pressing on the area may make a temporary dent in your skin (pitting edema). You may not be able to move your swollen arm or leg as much as usual. There are many causes of peripheral edema. It can happen because of a complication of other conditions such as heart failure, kidney disease, or a problem with your circulation. It also can be a side effect of certain medicines or happen because of an infection. It often happens to women during pregnancy. Sometimes, the cause is not known. Follow these instructions at home: Managing pain, stiffness, and swelling  Raise (elevate) your legs while you are sitting or lying down. Move around often to prevent stiffness and to reduce swelling. Do not sit or stand for long periods of time. Do not wear tight clothing. Do not wear garters on your upper legs. Exercise your legs to get your circulation going. This helps to move the fluid back into your blood vessels, and it may help the swelling go down. Wear compression stockings as told by your health care provider. These stockings help to prevent blood clots and reduce swelling in your legs. It is important that these are the correct size. These stockings should be prescribed by your doctor to prevent possible injuries. If elastic bandages or wraps are recommended, use them as told by your health care provider. Medicines Take over-the-counter and prescription medicines only as told by your health care provider. Your health care provider may prescribe medicine to help your body get rid of excess water (diuretic). Take this medicine if you are told to take it. General  instructions Eat a low-salt (low-sodium) diet as told by your health care provider. Sometimes, eating less salt may reduce swelling. Pay attention to any changes in your symptoms. Moisturize your skin daily to help prevent skin from cracking and draining. Keep all follow-up visits. This is important. Contact a health care provider if: You have a fever. You have swelling in only one leg. You have increased swelling, redness, or pain in one or both of your legs. You have drainage or sores at the area where you have edema. Get help right away if: You have edema that starts suddenly or is getting worse, especially if you are pregnant or have a medical condition. You develop shortness of breath, especially when you are lying down. You have pain in your chest or abdomen. You feel weak. You feel like you will faint. These symptoms may be an emergency. Get help right away. Call 911. Do not wait to see if the symptoms will go away. Do not drive yourself to the hospital. Summary Peripheral edema is swelling that is caused by a buildup of fluid. Peripheral edema most often affects the lower legs, ankles, and feet. Move around often to prevent stiffness and to reduce swelling. Do not sit or stand for long periods of time. Pay attention to any changes in your symptoms. Contact a health care provider if you have edema that starts suddenly or is getting worse, especially if you are pregnant or have a medical condition. Get help right away if you develop shortness of breath, especially when lying down.  This information is not intended to replace advice given to you by your health care provider. Make sure you discuss any questions you have with your health care provider. Document Revised: 05/07/2021 Document Reviewed: 05/07/2021 Elsevier Patient Education  2024 [employer].

## 2024-01-08 NOTE — Progress Notes (Signed)
 Subjective:    Patient ID: Norma Barton, female    DOB: 03-11-1935, 89 y.o.   MRN: [id number]   Chief Complaint: annual physical    HPI:  Norma Barton is a 89 y.o. who identifies as a female who was assigned female at birth.   Social history: Lives with: lives by herself Work history: retired   Water engineer in today for follow up of the following chronic medical issues:  1. Primary hypertension No c/o chest pain, sob or headache. Does not check blood pressure at home BP Readings from Last 3 Encounters:  10/29/23 114/60  07/10/23 114/78  04/23/23 106/70     2. Diabetes mellitus without complication (HCC) Does not check blood sugars at home. We increased her to farxiga  10mg  in march but patient has remained on 5mg  daily. Lab Results  Component Value Date   HGBA1C 6.2 (H) 07/10/2023     3. Paroxysmal atrial fibrillation (HCC) Is on eliquis  and is doing well. No bleeding issues  4. Gastroesophageal reflux disease without esophagitis Is on omperazole and is doing well  5. Hypokalemia No muscle cramps Lab Results  Component Value Date   K 4.1 11/03/2023     6. Peripheral edema Is on lasix  and that helps she still has occasional edema if she is on her feet a lot.  7. Primary insomnia Sleeping well right now with no medication  8. BMI 30.0-30.9,adult Weight is down 5 lbs  Wt Readings from Last 3 Encounters:  01/08/24 143 lb (64.9 kg)  11/12/23 145 lb (65.8 kg)  10/29/23 145 lb 12.8 oz (66.1 kg)   BMI Readings from Last 3 Encounters:  01/08/24 26.16 kg/m  11/12/23 26.52 kg/m  10/29/23 26.67 kg/m       New complaints: None today  Allergies  Allergen Reactions   Metformin  And Related Other (See Comments)    Reports heart racing while on metformin    Outpatient Encounter Medications as of 01/08/2024  Medication Sig   acetaminophen  (TYLENOL ) 500 MG tablet Take 1,000 mg by mouth every 6 (six) hours as needed for mild pain.   apixaban  (ELIQUIS )  5 MG TABS tablet Take 1 tablet (5 mg total) by mouth 2 (two) times daily.   blood glucose meter kit and supplies Dispense based on patient and insurance preference. Use up to four times daily as directed. (FOR ICD-10 E10.9, E11.9).   cholecalciferol  (VITAMIN D ) 25 MCG (1000 UNIT) tablet Take 2,000 Units by mouth in the morning.   dapagliflozin  propanediol (FARXIGA ) 5 MG TABS tablet TAKE 1 TABLET BY MOUTH DAILY BEFORE BREAKFAST.   docusate sodium  (COLACE) 100 MG capsule Take 1 capsule (100 mg total) by mouth 2 (two) times daily.   furosemide  (LASIX ) 40 MG tablet TAKE 1 TABLET (40 MG TOTAL) BY MOUTH DAILY. (MAY TAKE EXTRA 1/2 TAB (20MG ) AS NEEDED FOR SWELLING   glucose blood (ONETOUCH VERIO) test strip USE UP TO FOUR TIMES DAILY AS DIRECTED E10.9, E11.9   Lancets (ONETOUCH DELICA PLUS LANCET33G) MISC USE TO CHECK SUGAR UP TO 4 TIMES DAILY AS DIRECTED E10.9 E11.9   metoprolol  succinate (TOPROL -XL) 25 MG 24 hr tablet Take 1 tablet (25 mg total) by mouth daily.   omeprazole  (PRILOSEC) 40 MG capsule TAKE 1 CAPSULE (40 MG TOTAL) BY MOUTH DAILY.   potassium chloride  SA (KLOR-CON  M) 20 MEQ tablet Take 1 tablet (20 mEq total) by mouth daily.   pravastatin  (PRAVACHOL ) 20 MG tablet Take 1 tablet (20 mg total) by mouth daily.  No facility-administered encounter medications on file as of 01/08/2024.    Past Surgical History:  Procedure Laterality Date   ABDOMINAL HYSTERECTOMY     arthroscopy right knee     TOTAL KNEE ARTHROPLASTY Left 08/15/2016   Procedure: TOTAL KNEE ARTHROPLASTY;  Surgeon: Darrin Emerald, MD;  Location: [facility];  Service: Orthopedics;  Laterality: Left;   TOTAL KNEE ARTHROPLASTY Right 01/01/2022   Procedure: TOTAL KNEE ARTHROPLASTY;  Surgeon: Darrin Emerald, MD;  Location: [facility];  Service: Orthopedics;  Laterality: Right;    Family History  Problem Relation Age of Onset   Parkinson's disease Father    Breast cancer Sister       Controlled substance contract:  n/a     Review of Systems  Constitutional:  Negative for diaphoresis.  Eyes:  Negative for pain.  Respiratory:  Negative for shortness of breath.   Cardiovascular:  Negative for chest pain, palpitations and leg swelling.  Gastrointestinal:  Negative for abdominal pain.  Endocrine: Negative for polydipsia.  Skin:  Negative for rash.  Neurological:  Negative for dizziness, weakness and headaches.  Hematological:  Does not bruise/bleed easily.  All other systems reviewed and are negative.      Objective:   Physical Exam Vitals and nursing note reviewed.  Constitutional:      General: She is not in acute distress.    Appearance: Normal appearance. She is well-developed.  HENT:     Head: Normocephalic.     Right Ear: Tympanic membrane normal.     Left Ear: Tympanic membrane normal.     Nose: Nose normal.     Mouth/Throat:     Mouth: Mucous membranes are moist.  Eyes:     Pupils: Pupils are equal, round, and reactive to light.  Neck:     Vascular: No carotid bruit or JVD.  Cardiovascular:     Rate and Rhythm: Normal rate and regular rhythm.     Heart sounds: Normal heart sounds.  Pulmonary:     Effort: Pulmonary effort is normal. No respiratory distress.     Breath sounds: Normal breath sounds. No wheezing or rales.  Chest:     Chest wall: No tenderness.  Abdominal:     General: Bowel sounds are normal. There is no distension or abdominal bruit.     Palpations: Abdomen is soft. There is no hepatomegaly, splenomegaly, mass or pulsatile mass.     Tenderness: There is no abdominal tenderness.  Musculoskeletal:        General: Normal range of motion.     Cervical back: Normal range of motion and neck supple.     Right lower leg: Edema (1+) present.     Left lower leg: Edema (1+) present.  Lymphadenopathy:     Cervical: No cervical adenopathy.  Skin:    General: Skin is warm and dry.  Neurological:     Mental Status: She is alert and oriented to person, place, and time.      Deep Tendon Reflexes: Reflexes are normal and symmetric.  Psychiatric:        Behavior: Behavior normal.        Thought Content: Thought content normal.        Judgment: Judgment normal.    BP 105/72   Pulse 78   Temp 97.7 F (36.5 C) (Temporal)   Ht 5\' 2"  (1.575 m)   Wt 143 lb (64.9 kg)   SpO2 94%   BMI 26.16 kg/m    Hgba1c 6.2%  Assessment & Plan:   HETTY LINHART comes in today with chief complaint of annual physical  Diagnosis and orders addressed:  1. Primary hypertension Low sodium diet - CBC with Differential/Platelet - CMP14+EGFR - Lipid panel  2. Diabetes mellitus without complication (HCC) Continue to watch carbs in diet - Bayer DCA Hb A1c Waived - dapagliflozin  propanediol (FARXIGA ) 5 MG TABS tablet; Take 1 tablet (5 mg total) by mouth daily before breakfast.  Dispense: 90 tablet; Refill: 1  3. Paroxysmal atrial fibrillation (HCC) Report any bleeding issues - apixaban  (ELIQUIS ) 5 MG TABS tablet; Take 1 tablet (5 mg total) by mouth 2 (two) times daily.  Dispense: 60 tablet; Refill: 6 - metoprolol  succinate (TOPROL -XL) 25 MG 24 hr tablet; Take 1 tablet (25 mg total) by mouth daily.  Dispense: 90 tablet; Refill: 1  4. Gastroesophageal reflux disease without esophagitis Avoid spicy foods Do not eat 2 hours prior to bedtime  - omeprazole  (PRILOSEC) 40 MG capsule; Take 1 capsule (40 mg total) by mouth daily.  Dispense: 90 capsule; Refill: 1  5. Hypokalemia Labs pending - potassium chloride  SA (KLOR-CON  M) 20 MEQ tablet; Take 1 tablet (20 mEq total) by mouth daily.  Dispense: 90 tablet; Refill: 1  6. Peripheral edema Elevate legs when sitting - furosemide  (LASIX ) 40 MG tablet; Take 1 tablet (40 mg total) by mouth daily. (May take extra 1/2 tab (20mg ) as needed for swelling  Dispense: 90 tablet; Refill: 1  7. Primary insomnia Bedtime routine  8. BMI 30.0-30.9,adult Discussed diet and exercise for person with BMI >25 Will recheck weight in  3-6 months   9. Hyperlipidemia associated with type 2 diabetes mellitus (HCC) Low fat diet - pravastatin  (PRAVACHOL ) 20 MG tablet; Take 1 tablet (20 mg total) by mouth daily.  Dispense: 90 tablet; Refill: 1   Labs pending Health Maintenance reviewed Diet and exercise encouraged  Follow up plan: 6 months   Mary-Margaret Gaylyn Keas, FNP

## 2024-01-09 LAB — CBC WITH DIFFERENTIAL/PLATELET
Basos: 1 %
EOS (ABSOLUTE): 0 10*3/uL (ref 0.0–0.2)
Eos: 2 %
Hematocrit: 45.7 % (ref 34.0–46.6)
Hemoglobin: 15.5 g/dL (ref 11.1–15.9)
Immature Granulocytes: 0 %
Immature Granulocytes: 0 10*3/uL (ref 0.0–0.1)
Lymphs: 41 %
MCH: 32.4 pg (ref 26.6–33.0)
MCHC: 33.9 g/dL (ref 31.5–35.7)
MCV: 95 fL (ref 79–97)
Monocytes Absolute: 0.1 10*3/uL (ref 0.0–0.4)
Monocytes Absolute: 0.4 10*3/uL (ref 0.1–0.9)
Monocytes: 8 %
Neutrophils Absolute: 2.2 10*3/uL (ref 0.7–3.1)
Neutrophils Absolute: 2.5 10*3/uL (ref 1.4–7.0)
Neutrophils: 48 %
Platelets: 159 10*3/uL (ref 150–450)
RBC: 4.79 x10E6/uL (ref 3.77–5.28)
RDW: 12.3 % (ref 11.7–15.4)
WBC: 5.2 10*3/uL (ref 3.4–10.8)

## 2024-01-09 LAB — LIPID PANEL
Cholesterol, Total: 121 mg/dL (ref 100–199)
HDL: 55 mg/dL (ref >39)
LDL CALC COMMENT:: 2.2 ratio (ref 0.0–4.4)
LDL Chol Calc (NIH): 51 mg/dL (ref 0–99)
Triglycerides: 77 mg/dL (ref 0–149)
VLDL Cholesterol Cal: 15 mg/dL (ref 5–40)

## 2024-01-09 LAB — CMP14+EGFR
ALT: 17 IU/L (ref 0–32)
AST: 34 IU/L (ref 0–40)
Albumin: 3.9 g/dL (ref 3.7–4.7)
Alkaline Phosphatase: 78 IU/L (ref 44–121)
BUN/Creatinine Ratio: 13 (ref 12–28)
BUN: 16 mg/dL (ref 8–27)
Bilirubin Total: 0.7 mg/dL (ref 0.0–1.2)
CO2: 27 mmol/L (ref 20–29)
Calcium: 9.7 mg/dL (ref 8.7–10.3)
Chloride: 98 mmol/L (ref 96–106)
Creatinine, Ser: 1.24 mg/dL — ABNORMAL HIGH (ref 0.57–1.00)
Globulin, Total: 2.6 g/dL (ref 1.5–4.5)
Glucose: 162 mg/dL — ABNORMAL HIGH (ref 70–99)
Potassium: 3.2 mmol/L — ABNORMAL LOW (ref 3.5–5.2)
Sodium: 142 mmol/L (ref 134–144)
Total Protein: 6.5 g/dL (ref 6.0–8.5)
eGFR: 42 mL/min/{1.73_m2} — ABNORMAL LOW (ref >59)

## 2024-01-09 LAB — VITAMIN D 25 HYDROXY (VIT D DEFICIENCY, FRACTURES): Vit D, 25-Hydroxy: 76.3 ng/mL (ref 30.0–100.0)

## 2024-04-10 ENCOUNTER — Other Ambulatory Visit: Payer: Self-pay | Admitting: Nurse Practitioner

## 2024-04-10 DIAGNOSIS — R600 Localized edema: Secondary | ICD-10-CM

## 2024-05-09 ENCOUNTER — Other Ambulatory Visit: Payer: Self-pay | Admitting: Cardiology

## 2024-05-09 DIAGNOSIS — R600 Localized edema: Secondary | ICD-10-CM

## 2024-07-02 ENCOUNTER — Ambulatory Visit: Admitting: Nurse Practitioner

## 2024-07-02 ENCOUNTER — Encounter: Payer: Self-pay | Admitting: Nurse Practitioner

## 2024-07-02 VITALS — BP 124/79 | HR 85 | Temp 98.0°F | Ht 62.0 in | Wt 144.0 lb

## 2024-07-02 DIAGNOSIS — E119 Type 2 diabetes mellitus without complications: Secondary | ICD-10-CM

## 2024-07-02 DIAGNOSIS — E876 Hypokalemia: Secondary | ICD-10-CM | POA: Diagnosis not present

## 2024-07-02 DIAGNOSIS — I10 Essential (primary) hypertension: Secondary | ICD-10-CM | POA: Diagnosis not present

## 2024-07-02 DIAGNOSIS — N3000 Acute cystitis without hematuria: Secondary | ICD-10-CM | POA: Diagnosis not present

## 2024-07-02 DIAGNOSIS — I480 Paroxysmal atrial fibrillation: Secondary | ICD-10-CM | POA: Diagnosis not present

## 2024-07-02 DIAGNOSIS — Z7984 Long term (current) use of oral hypoglycemic drugs: Secondary | ICD-10-CM | POA: Diagnosis not present

## 2024-07-02 DIAGNOSIS — R300 Dysuria: Secondary | ICD-10-CM | POA: Diagnosis not present

## 2024-07-02 DIAGNOSIS — F5101 Primary insomnia: Secondary | ICD-10-CM

## 2024-07-02 DIAGNOSIS — Z23 Encounter for immunization: Secondary | ICD-10-CM

## 2024-07-02 DIAGNOSIS — E785 Hyperlipidemia, unspecified: Secondary | ICD-10-CM

## 2024-07-02 DIAGNOSIS — R600 Localized edema: Secondary | ICD-10-CM

## 2024-07-02 DIAGNOSIS — Z6830 Body mass index (BMI) 30.0-30.9, adult: Secondary | ICD-10-CM | POA: Diagnosis not present

## 2024-07-02 DIAGNOSIS — K219 Gastro-esophageal reflux disease without esophagitis: Secondary | ICD-10-CM | POA: Diagnosis not present

## 2024-07-02 DIAGNOSIS — E1169 Type 2 diabetes mellitus with other specified complication: Secondary | ICD-10-CM | POA: Diagnosis not present

## 2024-07-02 LAB — URINALYSIS, COMPLETE
Bilirubin, UA: NEGATIVE
Leukocytes,UA: NEGATIVE
Nitrite, UA: NEGATIVE
RBC, UA: NEGATIVE
Specific Gravity, UA: 1.01 (ref 1.005–1.030)
Urobilinogen, Ur: 0.2 mg/dL (ref 0.2–1.0)
pH, UA: 6 (ref 5.0–7.5)

## 2024-07-02 LAB — MICROSCOPIC EXAMINATION: Bacteria, UA: NONE SEEN

## 2024-07-02 LAB — BAYER DCA HB A1C WAIVED: HB A1C (BAYER DCA - WAIVED): 6.2 % — ABNORMAL HIGH (ref 4.8–5.6)

## 2024-07-02 MED ORDER — DAPAGLIFLOZIN PROPANEDIOL 5 MG PO TABS: 5.0000 mg | ORAL_TABLET | Freq: Every day | ORAL | 1 refills | Status: AC

## 2024-07-02 MED ORDER — POTASSIUM CHLORIDE CRYS ER 20 MEQ PO TBCR: 20.0000 meq | EXTENDED_RELEASE_TABLET | Freq: Every day | ORAL | 1 refills | Status: AC

## 2024-07-02 MED ORDER — FUROSEMIDE 40 MG PO TABS: 60.0000 mg | ORAL_TABLET | Freq: Every day | ORAL | 1 refills | Status: DC

## 2024-07-02 MED ORDER — SULFAMETHOXAZOLE-TRIMETHOPRIM 800-160 MG PO TABS: 1.0000 | ORAL_TABLET | Freq: Two times a day (BID) | ORAL | 0 refills | Status: DC

## 2024-07-02 MED ORDER — PRAVASTATIN SODIUM 20 MG PO TABS: 20.0000 mg | ORAL_TABLET | Freq: Every day | ORAL | 1 refills | Status: AC

## 2024-07-02 MED ORDER — OMEPRAZOLE 40 MG PO CPDR: 40.0000 mg | DELAYED_RELEASE_CAPSULE | Freq: Every day | ORAL | 1 refills | Status: AC

## 2024-07-02 MED ORDER — APIXABAN 5 MG PO TABS: 5.0000 mg | ORAL_TABLET | Freq: Two times a day (BID) | ORAL | 6 refills | Status: AC

## 2024-07-02 MED ORDER — METOPROLOL SUCCINATE ER 25 MG PO TB24: 25.0000 mg | ORAL_TABLET | Freq: Every day | ORAL | 1 refills | Status: AC

## 2024-07-02 NOTE — Progress Notes (Signed)
 Subjective:    Patient ID: Norma Barton, female    DOB: 01-29-1935, 89 y.o.   MRN: [id number]   Chief Complaint: medical management of chronic issues     HPI:  Norma Barton is a 89 y.o. who identifies as a female who was assigned female at birth.   Social history: Lives with: lives by herself Work history: retired   Water engineer in today for follow up of the following chronic medical issues:  1. Primary hypertension No c/o chest pain, sob or headache. Does not check blood pressure at home BP Readings from Last 3 Encounters:  01/08/24 105/72  10/29/23 114/60  07/10/23 114/78     2. Diabetes mellitus without complication (HCC) Does check blood sugars at home and was 162 today. We increased her to farxiga  10mg  in march but patient has remained on 5mg  daily. Lab Results  Component Value Date   HGBA1C 6.2 (H) 01/08/2024     3. Paroxysmal atrial fibrillation (HCC) Is on eliquis  and is doing well. No bleeding issues  4. Gastroesophageal reflux disease without esophagitis Is on omperazole and is doing well  5. Hypokalemia No muscle cramps Lab Results  Component Value Date   K 3.2 (L) 01/08/2024     6. Peripheral edema Is on lasix  and that helps she still has occasional edema if she is on her feet a lot.  7. Primary insomnia Sleeping well right now with no medication  8. BMI 30.0-30.9,adult Weight is down 5 lbs Wt Readings from Last 3 Encounters:  01/08/24 143 lb (64.9 kg)  11/12/23 145 lb (65.8 kg)  10/29/23 145 lb 12.8 oz (66.1 kg)   BMI Readings from Last 3 Encounters:  01/08/24 26.16 kg/m  11/12/23 26.52 kg/m  10/29/23 26.67 kg/m      New complaints: Dysuria that started about 3 days ago. Urinary frequency and urgency. No back pain or abdominal pain  Allergies  Allergen Reactions   Metformin  And Related Other (See Comments)    Reports heart racing while on metformin    Outpatient Encounter Medications as of 07/02/2024  Medication Sig    acetaminophen  (TYLENOL ) 500 MG tablet Take 1,000 mg by mouth every 6 (six) hours as needed for mild pain.   apixaban  (ELIQUIS ) 5 MG TABS tablet Take 1 tablet (5 mg total) by mouth 2 (two) times daily.   blood glucose meter kit and supplies Dispense based on patient and insurance preference. Use up to four times daily as directed. (FOR ICD-10 E10.9, E11.9).   cholecalciferol  (VITAMIN D ) 25 MCG (1000 UNIT) tablet Take 2,000 Units by mouth in the morning.   dapagliflozin  propanediol (FARXIGA ) 5 MG TABS tablet Take 1 tablet (5 mg total) by mouth daily before breakfast.   docusate sodium  (COLACE) 100 MG capsule Take 1 capsule (100 mg total) by mouth 2 (two) times daily.   furosemide  (LASIX ) 40 MG tablet TAKE 1 TABLET (40 MG TOTAL) BY MOUTH DAILY. (MAY TAKE EXTRA 1/2 TAB (20MG ) AS NEEDED FOR SWELLING   glucose blood (ONETOUCH VERIO) test strip USE UP TO FOUR TIMES DAILY AS DIRECTED E10.9, E11.9   Lancets (ONETOUCH DELICA PLUS LANCET33G) MISC USE TO CHECK SUGAR UP TO 4 TIMES DAILY AS DIRECTED E10.9 E11.9   metoprolol  succinate (TOPROL -XL) 25 MG 24 hr tablet Take 1 tablet (25 mg total) by mouth daily.   omeprazole  (PRILOSEC) 40 MG capsule Take 1 capsule (40 mg total) by mouth daily.   potassium chloride  SA (KLOR-CON  M) 20 MEQ tablet Take 1  tablet (20 mEq total) by mouth daily.   pravastatin  (PRAVACHOL ) 20 MG tablet Take 1 tablet (20 mg total) by mouth daily.   No facility-administered encounter medications on file as of 07/02/2024.    Past Surgical History:  Procedure Laterality Date   ABDOMINAL HYSTERECTOMY     arthroscopy right knee     TOTAL KNEE ARTHROPLASTY Left 08/15/2016   Procedure: TOTAL KNEE ARTHROPLASTY;  Surgeon: Taft FORBES Minerva, MD;  Location: [facility];  Service: Orthopedics;  Laterality: Left;   TOTAL KNEE ARTHROPLASTY Right 01/01/2022   Procedure: TOTAL KNEE ARTHROPLASTY;  Surgeon: Minerva Taft FORBES, MD;  Location: [facility];  Service: Orthopedics;  Laterality: Right;    Family  History  Problem Relation Age of Onset   Parkinson's disease Father    Breast cancer Sister       Controlled substance contract: n/a     Review of Systems  Constitutional:  Negative for diaphoresis.  Eyes:  Negative for pain.  Respiratory:  Negative for shortness of breath.   Cardiovascular:  Negative for chest pain, palpitations and leg swelling.  Gastrointestinal:  Negative for abdominal pain.  Endocrine: Negative for polydipsia.  Skin:  Negative for rash.  Neurological:  Negative for dizziness, weakness and headaches.  Hematological:  Does not bruise/bleed easily.  All other systems reviewed and are negative.      Objective:   Physical Exam Vitals and nursing note reviewed.  Constitutional:      General: She is not in acute distress.    Appearance: Normal appearance. She is well-developed.  HENT:     Head: Normocephalic.     Right Ear: Tympanic membrane normal.     Left Ear: Tympanic membrane normal.     Nose: Nose normal.     Mouth/Throat:     Mouth: Mucous membranes are moist.  Eyes:     Pupils: Pupils are equal, round, and reactive to light.  Neck:     Vascular: No carotid bruit or JVD.  Cardiovascular:     Rate and Rhythm: Normal rate and regular rhythm.     Heart sounds: Normal heart sounds.  Pulmonary:     Effort: Pulmonary effort is normal. No respiratory distress.     Breath sounds: Normal breath sounds. No wheezing or rales.  Chest:     Chest wall: No tenderness.  Abdominal:     General: Bowel sounds are normal. There is no distension or abdominal bruit.     Palpations: Abdomen is soft. There is no hepatomegaly, splenomegaly, mass or pulsatile mass.     Tenderness: There is no abdominal tenderness.  Musculoskeletal:        General: Normal range of motion.     Cervical back: Normal range of motion and neck supple.     Right lower leg: Edema (1+) present.     Left lower leg: Edema (1+) present.  Lymphadenopathy:     Cervical: No cervical  adenopathy.  Skin:    General: Skin is warm and dry.  Neurological:     Mental Status: She is alert and oriented to person, place, and time.     Deep Tendon Reflexes: Reflexes are normal and symmetric.  Psychiatric:        Behavior: Behavior normal.        Thought Content: Thought content normal.        Judgment: Judgment normal.    BP 124/79   Pulse 85   Temp 98 F (36.7 C) (Temporal)   Ht 5'  2 (1.575 m)   Wt 144 lb (65.3 kg)   SpO2 98%   BMI 26.34 kg/m    Hgba1c 6.2%      Assessment & Plan:   TARAN HABLE comes in today with chief complaint of medical management of chronic issues    Diagnosis and orders addressed:  1. Primary hypertension Low sodium diet - CBC with Differential/Platelet - CMP14+EGFR - Lipid panel  2. Diabetes mellitus without complication (HCC) Continue to watch carbs in diet - Bayer DCA Hb A1c Waived - dapagliflozin  propanediol (FARXIGA ) 5 MG TABS tablet; Take 1 tablet (5 mg total) by mouth daily before breakfast.  Dispense: 90 tablet; Refill: 1  3. Paroxysmal atrial fibrillation (HCC) Report any bleeding issues - apixaban  (ELIQUIS ) 5 MG TABS tablet; Take 1 tablet (5 mg total) by mouth 2 (two) times daily.  Dispense: 60 tablet; Refill: 6 - metoprolol  succinate (TOPROL -XL) 25 MG 24 hr tablet; Take 1 tablet (25 mg total) by mouth daily.  Dispense: 90 tablet; Refill: 1  4. Gastroesophageal reflux disease without esophagitis Avoid spicy foods Do not eat 2 hours prior to bedtime  - omeprazole  (PRILOSEC) 40 MG capsule; Take 1 capsule (40 mg total) by mouth daily.  Dispense: 90 capsule; Refill: 1  5. Hypokalemia Labs pending - potassium chloride  SA (KLOR-CON  M) 20 MEQ tablet; Take 1 tablet (20 mEq total) by mouth daily.  Dispense: 90 tablet; Refill: 1  6. Peripheral edema Elevate legs when sitting - furosemide  (LASIX ) 40 MG tablet; Take 1 tablet (40 mg total) by mouth daily. (May take extra 1/2 tab (20mg ) as needed for swelling   Dispense: 90 tablet; Refill: 1  7. Primary insomnia Bedtime routine  8. BMI 30.0-30.9,adult Discussed diet and exercise for person with BMI >25 Will recheck weight in 3-6 months   9. Hyperlipidemia associated with type 2 diabetes mellitus (HCC) Low fat diet - pravastatin  (PRAVACHOL ) 20 MG tablet; Take 1 tablet (20 mg total) by mouth daily.  Dispense: 90 tablet; Refill: 1  10. Dysuria Urine culture pending  11.  Acute cystitis Take medication as prescribe Cotton underwear Take shower not bath Cranberry juice, yogurt Force fluids AZO over the counter X2 days Culture pending RTO prn Bactrim ds bid for 10 days   Labs pending Health Maintenance reviewed Diet and exercise encouraged  Follow up plan: 6 months   Mary-Margaret Gladis, FNP

## 2024-07-02 NOTE — Patient Instructions (Signed)
 Take medication as prescribe Cotton underwear Take shower not bath Cranberry juice, yogurt Force fluids AZO over the counter X2 days Culture pending RTO prn

## 2024-07-03 LAB — LIPID PANEL
Chol/HDL Ratio: 2.4 ratio (ref 0.0–4.4)
Cholesterol, Total: 130 mg/dL (ref 100–199)
HDL: 54 mg/dL (ref >39)
LDL Chol Calc (NIH): 51 mg/dL (ref 0–99)
Triglycerides: 144 mg/dL (ref 0–149)
VLDL Cholesterol Cal: 25 mg/dL (ref 5–40)

## 2024-07-03 LAB — CBC WITH DIFFERENTIAL/PLATELET
Basophils Absolute: 0.1 x10E3/uL (ref 0.0–0.2)
Basos: 1 %
EOS (ABSOLUTE): 0.1 x10E3/uL (ref 0.0–0.4)
Eos: 2 %
Hematocrit: 45.2 % (ref 34.0–46.6)
Hemoglobin: 14.8 g/dL (ref 11.1–15.9)
Immature Grans (Abs): 0 x10E3/uL (ref 0.0–0.1)
Immature Granulocytes: 0 %
Lymphocytes Absolute: 2.5 x10E3/uL (ref 0.7–3.1)
Lymphs: 43 %
MCH: 31 pg (ref 26.6–33.0)
MCHC: 32.7 g/dL (ref 31.5–35.7)
MCV: 95 fL (ref 79–97)
Monocytes Absolute: 0.4 x10E3/uL (ref 0.1–0.9)
Monocytes: 7 %
Neutrophils Absolute: 2.8 x10E3/uL (ref 1.4–7.0)
Neutrophils: 47 %
Platelets: 155 x10E3/uL (ref 150–450)
RBC: 4.77 x10E6/uL (ref 3.77–5.28)
RDW: 12.5 % (ref 11.7–15.4)
WBC: 5.9 x10E3/uL (ref 3.4–10.8)

## 2024-07-03 LAB — CMP14+EGFR
ALT: 18 IU/L (ref 0–32)
AST: 34 IU/L (ref 0–40)
Albumin: 4.1 g/dL (ref 3.7–4.7)
Alkaline Phosphatase: 77 IU/L (ref 48–129)
BUN/Creatinine Ratio: 19 (ref 12–28)
BUN: 22 mg/dL (ref 8–27)
Bilirubin Total: 0.5 mg/dL (ref 0.0–1.2)
CO2: 27 mmol/L (ref 20–29)
Calcium: 9.7 mg/dL (ref 8.7–10.3)
Chloride: 100 mmol/L (ref 96–106)
Creatinine, Ser: 1.14 mg/dL — ABNORMAL HIGH (ref 0.57–1.00)
Globulin, Total: 2.4 g/dL (ref 1.5–4.5)
Glucose: 161 mg/dL — ABNORMAL HIGH (ref 70–99)
Potassium: 3.1 mmol/L — ABNORMAL LOW (ref 3.5–5.2)
Sodium: 140 mmol/L (ref 134–144)
Total Protein: 6.5 g/dL (ref 6.0–8.5)
eGFR: 47 mL/min/1.73 — ABNORMAL LOW (ref >59)

## 2024-07-05 ENCOUNTER — Ambulatory Visit: Payer: Self-pay | Admitting: Nurse Practitioner

## 2024-08-06 ENCOUNTER — Other Ambulatory Visit: Payer: Self-pay | Admitting: Nurse Practitioner

## 2024-08-06 DIAGNOSIS — R600 Localized edema: Secondary | ICD-10-CM

## 2024-08-17 ENCOUNTER — Ambulatory Visit: Admitting: Cardiology

## 2024-08-31 ENCOUNTER — Encounter: Payer: Self-pay | Admitting: Family Medicine

## 2024-08-31 ENCOUNTER — Ambulatory Visit: Admitting: Family Medicine

## 2024-08-31 ENCOUNTER — Ambulatory Visit: Payer: Self-pay | Admitting: Family Medicine

## 2024-08-31 VITALS — BP 113/75 | HR 78 | Temp 97.0°F | Ht 62.0 in | Wt 144.8 lb

## 2024-08-31 DIAGNOSIS — M62830 Muscle spasm of back: Secondary | ICD-10-CM | POA: Diagnosis not present

## 2024-08-31 DIAGNOSIS — M549 Dorsalgia, unspecified: Secondary | ICD-10-CM

## 2024-08-31 DIAGNOSIS — I440 Atrioventricular block, first degree: Secondary | ICD-10-CM | POA: Insufficient documentation

## 2024-08-31 MED ORDER — BACLOFEN 5 MG PO TABS: 5.0000 mg | ORAL_TABLET | Freq: Three times a day (TID) | ORAL | 0 refills | Status: AC | PRN

## 2024-08-31 NOTE — Progress Notes (Signed)
 Subjective:  Patient ID: Norma Barton, female    DOB: Mar 16, 1935, 89 y.o.   MRN: [id number]  Patient Care Team: Gladis Mustard, FNP as PCP - General (Nurse Practitioner) Alvan Dorn FALCON, MD as PCP - Cardiology (Cardiology) Frances Ozell RAMAN, LCSW as Social Worker (Licensed Clinical Social Worker) Ladora, Ross Lacy Cohn, MD as Referring Physician (Optometry) Margrette Taft BRAVO, MD as Consulting Physician (Orthopedic Surgery) Shona Rush, MD (Dermatology)   Chief Complaint:  Back Pain (Middle back pain x 2 days after lifting a tub that had clothes in it. )   HPI: Norma Barton is a 89 y.o. female presenting on 08/31/2024 for Back Pain (Middle back pain x 2 days after lifting a tub that had clothes in it. )    Norma Barton is an 89 year old female who presents with back pain following lifting a heavy object.  She has been experiencing intermittent back pain since Sunday afternoon after lifting a heavy container of clothes from her closet. The pain is located on the right side of her back.  Her son applied [facility] to the affected area. She has also used a heating pad and taken Tylenol , although she feels it has not been effective.  No pain radiating to her arms and no changes in bowel or bladder function, although she noted decreased urination yesterday despite adequate water intake. She has been drinking water regularly, consuming six bottles today.      Family and patient inquiring about possible hear involvement. EKG completed. A lot of artifact on EKG as pt was unable to hold still during exam. Watched the tracing on the EKG machine and verified SR with first degree AV block, computer would not save this tracing.     Relevant past medical, surgical, family, and social history reviewed and updated as indicated.  Allergies and medications reviewed and updated. Data reviewed: Chart in Epic.   Past Medical History:  Diagnosis Date   Arthritis    Diabetes mellitus  without complication (HCC)    GERD (gastroesophageal reflux disease)    Hypertension     Past Surgical History:  Procedure Laterality Date   ABDOMINAL HYSTERECTOMY     arthroscopy right knee     TOTAL KNEE ARTHROPLASTY Left 08/15/2016   Procedure: TOTAL KNEE ARTHROPLASTY;  Surgeon: Taft BRAVO Margrette, MD;  Location: [facility];  Service: Orthopedics;  Laterality: Left;   TOTAL KNEE ARTHROPLASTY Right 01/01/2022   Procedure: TOTAL KNEE ARTHROPLASTY;  Surgeon: Margrette Taft BRAVO, MD;  Location: [facility];  Service: Orthopedics;  Laterality: Right;    Social History   Socioeconomic History   Marital status: Widowed    Spouse name: Not on file   Number of children: 5   Years of education: 30   Highest education level: 12th grade  Occupational History   Occupation: retired  Tobacco Use   Smoking status: Never   Smokeless tobacco: Never  Substance and Sexual Activity   Alcohol use: No   Drug use: No   Sexual activity: Not Currently  Other Topics Concern   Not on file  Social History Narrative   Lives alone in apartment   Has 5 sons - they all live within 30 miles or so   Had one daughter who died right after birth (cord wrapped around neck at birth)   Social Drivers of Health   Tobacco Use: Low Risk (08/31/2024)   Patient History    Smoking Tobacco Use: Never  Smokeless Tobacco Use: Never    Passive Exposure: Not on file  Financial Resource Strain: Low Risk (11/12/2023)   Overall Financial Resource Strain (CARDIA)    Difficulty of Paying Living Expenses: Not hard at all  Food Insecurity: No Food Insecurity (11/12/2023)   Hunger Vital Sign    Worried About Running Out of Food in the Last Year: Never true    Ran Out of Food in the Last Year: Never true  Transportation Needs: No Transportation Needs (11/12/2023)   PRAPARE - Administrator, Civil Service (Medical): No    Lack of Transportation (Non-Medical): No  Physical Activity: Inactive (11/12/2023)   Exercise  Vital Sign    Days of Exercise per Week: 0 days    Minutes of Exercise per Session: 0 min  Stress: No Stress Concern Present (11/12/2023)   [employer] of Occupational Health - Occupational Stress Questionnaire    Feeling of Stress : Not at all  Social Connections: Moderately Isolated (11/12/2023)   Social Connection and Isolation Panel    Frequency of Communication with Friends and Family: More than three times a week    Frequency of Social Gatherings with Friends and Family: Three times a week    Attends Religious Services: Never    Active Member of Clubs or Organizations: Yes    Attends Banker Meetings: More than 4 times per year    Marital Status: Widowed  Intimate Partner Violence: Not At Risk (11/12/2023)   Humiliation, Afraid, Rape, and Kick questionnaire    Fear of Current or Ex-Partner: No    Emotionally Abused: No    Physically Abused: No    Sexually Abused: No  Depression (PHQ2-9): Low Risk (07/02/2024)   Depression (PHQ2-9)    PHQ-2 Score: 0  Alcohol Screen: Low Risk (11/12/2023)   Alcohol Screen    Last Alcohol Screening Score (AUDIT): 0  Housing: Unknown (11/12/2023)   Housing Stability Vital Sign    Unable to Pay for Housing in the Last Year: No    Number of Times Moved in the Last Year: Not on file    Homeless in the Last Year: No  Utilities: Not At Risk (11/12/2023)   [employer] Utilities    Threatened with loss of utilities: No  Health Literacy: Adequate Health Literacy (11/12/2023)   B1300 Health Literacy    Frequency of need for help with medical instructions: Never    Outpatient Encounter Medications as of 08/31/2024  Medication Sig   acetaminophen  (TYLENOL ) 500 MG tablet Take 1,000 mg by mouth every 6 (six) hours as needed for mild pain.   apixaban  (ELIQUIS ) 5 MG TABS tablet Take 1 tablet (5 mg total) by mouth 2 (two) times daily.   Baclofen  5 MG TABS Take 1 tablet (5 mg total) by mouth 3 (three) times daily as needed. Sedation precautions    blood glucose meter kit and supplies Dispense based on patient and insurance preference. Use up to four times daily as directed. (FOR ICD-10 E10.9, E11.9).   cholecalciferol  (VITAMIN D ) 25 MCG (1000 UNIT) tablet Take 2,000 Units by mouth in the morning.   dapagliflozin  propanediol (FARXIGA ) 5 MG TABS tablet Take 1 tablet (5 mg total) by mouth daily before breakfast.   docusate sodium  (COLACE) 100 MG capsule Take 1 capsule (100 mg total) by mouth 2 (two) times daily.   furosemide  (LASIX ) 40 MG tablet TAKE 1 TABLET BY MOUTH EVERY DAY   glucose blood (ONETOUCH VERIO) test strip USE UP TO FOUR  TIMES DAILY AS DIRECTED E10.9, E11.9   Lancets (ONETOUCH DELICA PLUS LANCET33G) MISC USE TO CHECK SUGAR UP TO 4 TIMES DAILY AS DIRECTED E10.9 E11.9   metoprolol  succinate (TOPROL -XL) 25 MG 24 hr tablet Take 1 tablet (25 mg total) by mouth daily.   omeprazole  (PRILOSEC) 40 MG capsule Take 1 capsule (40 mg total) by mouth daily.   potassium chloride  SA (KLOR-CON  M) 20 MEQ tablet Take 1 tablet (20 mEq total) by mouth daily.   pravastatin  (PRAVACHOL ) 20 MG tablet Take 1 tablet (20 mg total) by mouth daily.   [DISCONTINUED] sulfamethoxazole -trimethoprim  (BACTRIM  DS) 800-160 MG tablet Take 1 tablet by mouth 2 (two) times daily.   No facility-administered encounter medications on file as of 08/31/2024.    Allergies[1]  Pertinent ROS per HPI, otherwise unremarkable      Objective:  BP 113/75   Pulse 78   Temp (!) 97 F (36.1 C)   Ht 5' 2 (1.575 m)   Wt 144 lb 12.8 oz (65.7 kg)   SpO2 95%   BMI 26.48 kg/m    Wt Readings from Last 3 Encounters:  08/31/24 144 lb 12.8 oz (65.7 kg)  07/02/24 144 lb (65.3 kg)  01/08/24 143 lb (64.9 kg)    Physical Exam Vitals and nursing note reviewed.  Constitutional:      General: She is not in acute distress.    Appearance: Normal appearance. She is overweight. She is not ill-appearing, toxic-appearing or diaphoretic.  HENT:     Head: Normocephalic and  atraumatic.     Mouth/Throat:     Mouth: Mucous membranes are moist.  Eyes:     Conjunctiva/sclera: Conjunctivae normal.     Pupils: Pupils are equal, round, and reactive to light.  Cardiovascular:     Rate and Rhythm: Normal rate and regular rhythm. Occasional Extrasystoles are present.    Heart sounds: Normal heart sounds.  Pulmonary:     Effort: Pulmonary effort is normal.     Breath sounds: Normal breath sounds.  Musculoskeletal:     Cervical back: Normal and neck supple.     Thoracic back: Spasms and tenderness present. No swelling, edema, deformity, signs of trauma, lacerations or bony tenderness. Normal range of motion. No scoliosis.     Lumbar back: Normal.       Back:     Right lower leg: No edema.     Left lower leg: No edema.  Skin:    General: Skin is warm and dry.     Capillary Refill: Capillary refill takes less than 2 seconds.  Neurological:     General: No focal deficit present.     Mental Status: She is alert and oriented to person, place, and time.  Psychiatric:        Mood and Affect: Mood normal.        Behavior: Behavior normal. Behavior is cooperative.        Thought Content: Thought content normal.        Judgment: Judgment normal.      Results for orders placed or performed in visit on 07/02/24  Bayer DCA Hb A1c Waived   Collection Time: 07/02/24  2:44 PM  Result Value Ref Range   HB A1C (BAYER DCA - WAIVED) 6.2 (H) 4.8 - 5.6 %  CBC with Differential/Platelet   Collection Time: 07/02/24  2:45 PM  Result Value Ref Range   WBC 5.9 3.4 - 10.8 x10E3/uL   RBC 4.77 3.77 - 5.28 x10E6/uL   Hemoglobin  14.8 11.1 - 15.9 g/dL   Hematocrit 54.7 65.9 - 46.6 %   MCV 95 79 - 97 fL   MCH 31.0 26.6 - 33.0 pg   MCHC 32.7 31.5 - 35.7 g/dL   RDW 87.4 88.2 - 84.5 %   Platelets 155 150 - 450 x10E3/uL   Neutrophils 47 Not Estab. %   Lymphs 43 Not Estab. %   Monocytes 7 Not Estab. %   Eos 2 Not Estab. %   Basos 1 Not Estab. %   Neutrophils Absolute 2.8 1.4 -  7.0 x10E3/uL   Lymphocytes Absolute 2.5 0.7 - 3.1 x10E3/uL   Monocytes Absolute 0.4 0.1 - 0.9 x10E3/uL   EOS (ABSOLUTE) 0.1 0.0 - 0.4 x10E3/uL   Basophils Absolute 0.1 0.0 - 0.2 x10E3/uL   Immature Granulocytes 0 Not Estab. %   Immature Grans (Abs) 0.0 0.0 - 0.1 x10E3/uL  CMP14+EGFR   Collection Time: 07/02/24  2:45 PM  Result Value Ref Range   Glucose 161 (H) 70 - 99 mg/dL   BUN 22 8 - 27 mg/dL   Creatinine, Ser 8.85 (H) 0.57 - 1.00 mg/dL   eGFR 47 (L) >40 fO/fpw/8.26   BUN/Creatinine Ratio 19 12 - 28   Sodium 140 134 - 144 mmol/L   Potassium 3.1 (L) 3.5 - 5.2 mmol/L   Chloride 100 96 - 106 mmol/L   CO2 27 20 - 29 mmol/L   Calcium 9.7 8.7 - 10.3 mg/dL   Total Protein 6.5 6.0 - 8.5 g/dL   Albumin 4.1 3.7 - 4.7 g/dL   Globulin, Total 2.4 1.5 - 4.5 g/dL   Bilirubin Total 0.5 0.0 - 1.2 mg/dL   Alkaline Phosphatase 77 48 - 129 IU/L   AST 34 0 - 40 IU/L   ALT 18 0 - 32 IU/L  Lipid panel   Collection Time: 07/02/24  2:45 PM  Result Value Ref Range   Cholesterol, Total 130 100 - 199 mg/dL   Triglycerides 855 0 - 149 mg/dL   HDL 54 >60 mg/dL   VLDL Cholesterol Cal 25 5 - 40 mg/dL   LDL Chol Calc (NIH) 51 0 - 99 mg/dL   Chol/HDL Ratio 2.4 0.0 - 4.4 ratio  Microscopic Examination   Collection Time: 07/02/24  3:20 PM   Urine  Result Value Ref Range   WBC, UA 0-5 0 - 5 /hpf   RBC, Urine 0-2 0 - 2 /hpf   Epithelial Cells (non renal) 0-10 0 - 10 /hpf   Bacteria, UA None seen None seen/Few  Urinalysis, Complete   Collection Time: 07/02/24  3:20 PM  Result Value Ref Range   Specific Gravity, UA 1.010 1.005 - 1.030   pH, UA 6.0 5.0 - 7.5   Color, UA Yellow Yellow   Appearance Ur Clear Clear   Leukocytes,UA Negative Negative   Protein,UA Trace Negative/Trace   Glucose, UA 3+ (A) Negative   Ketones, UA Trace (A) Negative   RBC, UA Negative Negative   Bilirubin, UA Negative Negative   Urobilinogen, Ur 0.2 0.2 - 1.0 mg/dL   Nitrite, UA Negative Negative   Microscopic Examination  See below:      EKG: SR with first degree AV block and occasional PACs, 68, PR 210 ms, QT 398 ms, no acute ST-T changes. No significant changes from prior. Not A-Fib as this was looked at several times on the acutal EKG machine with noted P waves present. Machine would not save these tracings. Rosaline Bruns, FNP-C  Pertinent labs &  imaging results that were available during my care of the patient were reviewed by me and considered in my medical decision making.  Assessment & Plan:  Sanna was seen today for back pain.  Diagnoses and all orders for this visit:  Mid back pain -     Baclofen  5 MG TABS; Take 1 tablet (5 mg total) by mouth 3 (three) times daily as needed. Sedation precautions -     EKG 12-Lead  Muscle spasm of back -     Baclofen  5 MG TABS; Take 1 tablet (5 mg total) by mouth 3 (three) times daily as needed. Sedation precautions  First degree AV block -     EKG 12-Lead       Acute thoracic back pain with muscle spasm Acute thoracic back pain with muscle spasm, onset Sunday afternoon after lifting a heavy object. Pain is intermittent, located in the thoracic region, with associated muscle spasm. No radiation to the lower back or extremities. No bowel or bladder dysfunction. Pain exacerbated by standing and relieved by bending over. Current pain management with Tylenol  is ineffective. - Prescribed baclofen , to be taken up to three times a day as needed for muscle spasm, with caution due to potential drowsiness. - Continue Tylenol  arthritis for pain management. - Use topical treatments such as Biofreeze or Icy Hot for additional pain relief. - Encourage hydration. - Advised to report any new symptoms or worsening of condition.          Continue all other maintenance medications.  Follow up plan: Return if symptoms worsen or fail to improve.   Continue healthy lifestyle choices, including diet (rich in fruits, vegetables, and lean proteins, and low in salt and simple  carbohydrates) and exercise (at least 30 minutes of moderate physical activity daily).  Educational handout given for muscle spasm  The above assessment and management plan was discussed with the patient. The patient verbalized understanding of and has agreed to the management plan. Patient is aware to call the clinic if they develop any new symptoms or if symptoms persist or worsen. Patient is aware when to return to the clinic for a follow-up visit. Patient educated on when it is appropriate to go to the emergency department.   Rosaline Bruns, FNP-C [facility] [phone]     [1]  Allergies Allergen Reactions   Metformin  And Related Other (See Comments)    Reports heart racing while on metformin 

## 2024-09-06 ENCOUNTER — Ambulatory Visit: Payer: Self-pay

## 2024-09-06 ENCOUNTER — Telehealth: Payer: Self-pay | Admitting: Family Medicine

## 2024-09-06 NOTE — Telephone Encounter (Signed)
 Seen rakes she rx it it but she is out all week,

## 2024-09-06 NOTE — Telephone Encounter (Signed)
 APPT MADE

## 2024-09-06 NOTE — Telephone Encounter (Signed)
 Copied from CRM #[id number]. Topic: Clinical - Medical Advice >> Sep 06, 2024 12:29 PM Norma Barton wrote: Reason for CRM: Pt has concerns about the baclofen  she is taking, says it is not working.   Best contact: [phone]

## 2024-09-06 NOTE — Telephone Encounter (Signed)
 FYI Only or Action Required?: FYI only for provider: appointment scheduled on 12/23.  Patient was last seen in primary care on 08/31/2024 by Severa Rock HERO, FNP.  Called Nurse Triage reporting Back Pain.  Symptoms began a week ago.  Interventions attempted: OTC medications: Tylenol  arthritis, Prescription medications: Baclofen , and Ice/heat application.  Symptoms are: unchanged.  Triage Disposition: See PCP When Office is Open (Within 3 Days)  Patient/caregiver understands and will follow disposition?: Yes   Onset 6/10 pain on mid right side of back and right arm on 12/14 after picking up something heavy. Seen in office on 12/16, prescribed baclofen , hasn't helped. Denies new onset of numbness, weakness, tingling or difficulty walking. No changes to bowel or bladder control or urine retention. Symptoms have not gotten worse. Scheduled appt with PCP tomorrow. Advised UC or ED for worsening symptoms.    Copied from CRM #[id number]. Topic: Clinical - Red Word Triage >> Sep 06, 2024  2:03 PM Kevelyn M wrote: Reason for CRM: Patient is calling because she is experiencing back pain. The pain medication she was prescribed last week on 08/31/2024 is not working. Reason for Disposition  [1] MODERATE back pain (e.g., interferes with normal activities) AND [2] present > 3 days  Answer Assessment - Initial Assessment Questions 1. ONSET: When did the pain begin? (e.g., minutes, hours, days)     12/14  2. LOCATION: Where does it hurt? (upper, mid or lower back)     Mid right back  3. SEVERITY: How bad is the pain?  (e.g., Scale 1-10; mild, moderate, or severe)     6/10 aching pain  4. PATTERN: Is the pain constant? (e.g., yes, no; constant, intermittent)      Comes and goes  5. RADIATION: Does the pain shoot into your legs or somewhere else?     Right arm  6. CAUSE:  What do you think is causing the back pain?      Lifted something heavy  7. BACK OVERUSE:  Any recent lifting  of heavy objects, strenuous work or exercise?     Lifted something heavy recently  8. MEDICINES: What have you taken so far for the pain? (e.g., nothing, acetaminophen , NSAIDS)     Tylenol  arthritis, baclofen   9. NEUROLOGIC SYMPTOMS: Do you have any weakness, numbness, or problems with bowel/bladder control?     Denies  10. OTHER SYMPTOMS: Do you have any other symptoms? (e.g., fever, abdomen pain, burning with urination, blood in urine)       Denies  Protocols used: Back Pain-A-AH

## 2024-09-07 ENCOUNTER — Ambulatory Visit: Admitting: Nurse Practitioner

## 2024-10-19 ENCOUNTER — Ambulatory Visit: Admitting: Cardiology

## 2024-11-12 ENCOUNTER — Ambulatory Visit: Payer: Self-pay

## 2024-11-16 ENCOUNTER — Ambulatory Visit

## 2024-12-24 ENCOUNTER — Ambulatory Visit: Payer: Self-pay | Admitting: Nurse Practitioner

## 2025-01-12 ENCOUNTER — Ambulatory Visit: Admitting: Cardiology
